# Patient Record
Sex: Male | Born: 1960
Health system: Southern US, Community
[De-identification: ages and names within clinical notes are randomized; demographics above are authoritative.]

## PROBLEM LIST (undated history)

## (undated) DIAGNOSIS — M51369 Other intervertebral disc degeneration, lumbar region without mention of lumbar back pain or lower extremity pain: Secondary | ICD-10-CM

## (undated) DIAGNOSIS — M542 Cervicalgia: Secondary | ICD-10-CM

## (undated) DIAGNOSIS — K219 Gastro-esophageal reflux disease without esophagitis: Secondary | ICD-10-CM

## (undated) DIAGNOSIS — M79606 Pain in leg, unspecified: Secondary | ICD-10-CM

## (undated) DIAGNOSIS — M5030 Other cervical disc degeneration, unspecified cervical region: Secondary | ICD-10-CM

## (undated) DIAGNOSIS — Z972 Presence of dental prosthetic device (complete) (partial): Secondary | ICD-10-CM

## (undated) DIAGNOSIS — M5136 Other intervertebral disc degeneration, lumbar region: Secondary | ICD-10-CM

## (undated) HISTORY — PX: CHOLECYSTECTOMY: SHX55

## (undated) HISTORY — PX: MULTIPLE TOOTH EXTRACTIONS: SHX2053

## (undated) HISTORY — PX: COLONOSCOPY: SHX174

---

## 2000-11-02 HISTORY — PX: BLADDER SURGERY: SHX569

## 2008-12-21 ENCOUNTER — Ambulatory Visit: Payer: Self-pay | Admitting: Nurse Practitioner

## 2009-11-27 ENCOUNTER — Encounter: Admission: RE | Admit: 2009-11-27 | Discharge: 2009-11-27 | Payer: Self-pay | Admitting: Unknown Physician Specialty

## 2012-11-16 ENCOUNTER — Ambulatory Visit: Payer: Self-pay | Admitting: Family Medicine

## 2012-12-29 ENCOUNTER — Ambulatory Visit: Payer: Self-pay | Admitting: Surgery

## 2012-12-29 DIAGNOSIS — E119 Type 2 diabetes mellitus without complications: Secondary | ICD-10-CM

## 2012-12-29 LAB — HEPATIC FUNCTION PANEL A (ARMC)
Albumin: 3.8 g/dL (ref 3.4–5.0)
Bilirubin, Direct: 0.1 mg/dL (ref 0.00–0.20)
Bilirubin,Total: 0.5 mg/dL (ref 0.2–1.0)
SGOT(AST): 20 U/L (ref 15–37)
SGPT (ALT): 29 U/L (ref 12–78)
Total Protein: 7.3 g/dL (ref 6.4–8.2)

## 2012-12-29 LAB — BASIC METABOLIC PANEL
Anion Gap: 7 (ref 7–16)
BUN: 15 mg/dL (ref 7–18)
Calcium, Total: 8.5 mg/dL (ref 8.5–10.1)
Chloride: 107 mmol/L (ref 98–107)
Co2: 25 mmol/L (ref 21–32)
Creatinine: 1.05 mg/dL (ref 0.60–1.30)
EGFR (Non-African Amer.): >60
Glucose: 106 mg/dL — ABNORMAL HIGH (ref 65–99)
Osmolality: 279 (ref 275–301)

## 2012-12-29 LAB — CBC WITH DIFFERENTIAL/PLATELET
Eosinophil #: 0.2 10*3/uL (ref 0.0–0.7)
Eosinophil %: 2.6 %
HCT: 44 % (ref 40.0–52.0)
HGB: 15.2 g/dL (ref 13.0–18.0)
Lymphocyte #: 2.7 10*3/uL (ref 1.0–3.6)
Lymphocyte %: 30.5 %
MCH: 29.8 pg (ref 26.0–34.0)
Monocyte %: 7.3 %
Platelet: 264 10*3/uL (ref 150–440)
RBC: 5.1 10*6/uL (ref 4.40–5.90)
WBC: 8.7 10*3/uL (ref 3.8–10.6)

## 2013-01-03 ENCOUNTER — Ambulatory Visit: Payer: Self-pay | Admitting: Surgery

## 2013-07-29 ENCOUNTER — Encounter (HOSPITAL_COMMUNITY): Payer: Self-pay | Admitting: Emergency Medicine

## 2013-07-29 ENCOUNTER — Emergency Department (HOSPITAL_COMMUNITY)
Admission: EM | Admit: 2013-07-29 | Discharge: 2013-07-29 | Disposition: A | Discharge status: Home / Self Care | Payer: Managed Care, Other (non HMO) | Attending: Emergency Medicine | Admitting: Emergency Medicine

## 2013-07-29 DIAGNOSIS — M5137 Other intervertebral disc degeneration, lumbosacral region: Secondary | ICD-10-CM | POA: Insufficient documentation

## 2013-07-29 DIAGNOSIS — M549 Dorsalgia, unspecified: Secondary | ICD-10-CM

## 2013-07-29 DIAGNOSIS — M5136 Other intervertebral disc degeneration, lumbar region: Secondary | ICD-10-CM

## 2013-07-29 DIAGNOSIS — M545 Low back pain, unspecified: Secondary | ICD-10-CM | POA: Insufficient documentation

## 2013-07-29 DIAGNOSIS — M51379 Other intervertebral disc degeneration, lumbosacral region without mention of lumbar back pain or lower extremity pain: Secondary | ICD-10-CM | POA: Insufficient documentation

## 2013-07-29 DIAGNOSIS — F17200 Nicotine dependence, unspecified, uncomplicated: Secondary | ICD-10-CM | POA: Insufficient documentation

## 2013-07-29 HISTORY — DX: Other intervertebral disc degeneration, lumbar region without mention of lumbar back pain or lower extremity pain: M51.369

## 2013-07-29 HISTORY — DX: Other cervical disc degeneration, unspecified cervical region: M50.30

## 2013-07-29 HISTORY — DX: Other intervertebral disc degeneration, lumbar region: M51.36

## 2013-07-29 MED ORDER — DIAZEPAM 5 MG/ML IJ SOLN
10.0000 mg | Freq: Once | INTRAMUSCULAR | Status: AC
  Administered 2013-07-29: 10 mg via INTRAMUSCULAR
  Filled 2013-07-29: qty 2

## 2013-07-29 MED ORDER — OXYCODONE-ACETAMINOPHEN 5-325 MG PO TABS: 1.0000 | ORAL_TABLET | ORAL | Status: DC | PRN

## 2013-07-29 MED ORDER — ONDANSETRON 4 MG PO TBDP
8.0000 mg | ORAL_TABLET | Freq: Once | ORAL | Status: AC
  Administered 2013-07-29: 8 mg via ORAL
  Filled 2013-07-29: qty 2

## 2013-07-29 MED ORDER — DIAZEPAM 10 MG PO TABS: 10.0000 mg | ORAL_TABLET | Freq: Four times a day (QID) | ORAL | Status: DC | PRN

## 2013-07-29 MED ORDER — HYDROMORPHONE HCL PF 2 MG/ML IJ SOLN
2.0000 mg | Freq: Once | INTRAMUSCULAR | Status: AC
  Administered 2013-07-29: 2 mg via INTRAMUSCULAR
  Filled 2013-07-29: qty 1

## 2013-07-29 NOTE — ED Notes (Signed)
Patient is up with assistance x1 taking five step with assistance x1.

## 2013-07-29 NOTE — ED Provider Notes (Signed)
CSN: [id number]     Arrival date & time 07/29/13  4098 History   First MD Initiated Contact with Patient 07/29/13 0831     Chief Complaint  Patient presents with  . Back Pain   (Consider location/radiation/quality/duration/timing/severity/associated sxs/prior Treatment) HPI Comments: Seth [name] is a 52 y.o. Male who presents for evaluation of lower back pain. Pain started yesterday after he bent over to wash his hands at a sink. Since then, he has had severe low back pain, radiating to the right is worse with movement. He did not take anything for it. He has known degenerative disc disease of the cervical and lumbar spine. He had injection for cervical degenerative disc disease, recently. He is contemplating neck surgery. He denies leg weakness, bowel, or urinary incontinence, now. He denies fever, chills, cough, chest pain, shortness of breath. He denies or dizziness. There are no other known modifying factors.  Patient is a 52 y.o. male presenting with back pain. The history is provided by the patient.  Back Pain   Past Medical History  Diagnosis Date  . Degenerative disc disease, cervical   . Degenerative disc disease, lumbar    Past Surgical History  Procedure Laterality Date  . Cholecystectomy    . Bladder surgery  2002    removed scar tissue   No family history on file. History  Substance Use Topics  . Smoking status: Current Every Day Smoker -- 1.00 packs/day    Types: Cigarettes  . Smokeless tobacco: Not on file  . Alcohol Use: No     Comment: occasionally    Review of Systems  Musculoskeletal: Positive for back pain.  All other systems reviewed and are negative.    Allergies  Review of patient's allergies indicates no known allergies.  Home Medications   Current Outpatient Rx  Name  Route  Sig  Dispense  Refill  . traMADol (ULTRAM) 50 MG tablet   Oral   Take 50 mg by mouth every 6 (six) hours as needed for pain.         Marland Kitchen venlafaxine (EFFEXOR) 75 MG  tablet   Oral   Take 75 mg by mouth daily.          . diazepam (VALIUM) 10 MG tablet   Oral   Take 1 tablet (10 mg total) by mouth every 6 (six) hours as needed (Muscle spasm).   20 tablet   0   . oxyCODONE-acetaminophen (PERCOCET/ROXICET) 5-325 MG per tablet   Oral   Take 1 tablet by mouth every 4 (four) hours as needed for pain.   30 tablet   0    BP 130/78  Pulse 72  Temp(Src) 97.9 F (36.6 C) (Oral)  Resp 18  Ht 6\' 3"  (1.905 m)  Wt 225 lb (102.059 kg)  BMI 28.12 kg/m2  SpO2 100% Physical Exam  Nursing note and vitals reviewed. Constitutional: He is oriented to person, place, and time. He appears well-developed and well-nourished.  HENT:  Head: Normocephalic and atraumatic.  Right Ear: External ear normal.  Left Ear: External ear normal.  Eyes: Conjunctivae and EOM are normal. Pupils are equal, round, and reactive to light.  Neck: Normal range of motion and phonation normal. Neck supple.  Cardiovascular: Normal rate and regular rhythm.   Pulmonary/Chest: Effort normal. He exhibits no bony tenderness.  Abdominal: Normal appearance.  Musculoskeletal: He exhibits tenderness.  Tender right lumbar with decreased motion secondary to pain.  Neurological: He is alert and oriented to person, place, and  time. He has normal strength. No cranial nerve deficit or sensory deficit. He exhibits normal muscle tone. Coordination normal.  Skin: Skin is warm, dry and intact.  Psychiatric: He has a normal mood and affect. His behavior is normal. Judgment and thought content normal.    ED Course  Procedures (including critical care time)  Medications  HYDROmorphone (DILAUDID) injection 2 mg (2 mg Intramuscular Given 07/29/13 0950)  diazepam (VALIUM) injection 10 mg (10 mg Intramuscular Given 07/29/13 0951)  ondansetron (ZOFRAN-ODT) disintegrating tablet 8 mg (8 mg Oral Given 07/29/13 0950)    Patient Vitals for the past 24 hrs:  BP Temp Temp src Pulse Resp SpO2 Height Weight   07/29/13 1100 130/78 mmHg - - 72 - 100 % - -  07/29/13 1030 127/88 mmHg - - 70 18 100 % - -  07/29/13 1000 129/84 mmHg - - 75 - 98 % - -  07/29/13 0930 125/82 mmHg - - 72 - 95 % - -  07/29/13 0900 128/97 mmHg - - 81 - 100 % - -  07/29/13 0835 130/93 mmHg 97.9 F (36.6 C) Oral 85 20 99 % 6\' 3"  (1.905 m) 225 lb (102.059 kg)     10:41 AM Reevaluation with update and discussion. After initial assessment and treatment, an updated evaluation reveals he is improved, ambulation trial, ordered. Seth [name] L   11:15 a.m.- able to ambulate within the room. This is an improvement    MDM   1. Back pain   2. Degenerative disc disease, lumbar    Acute back pain secondary to muscle strain. Doubt cauda equina syndrome, significant spinal stenosis, or nerve impingement. He is stable for discharge   Nursing Notes Reviewed/ Care Coordinated, and agree without changes. Applicable Imaging Reviewed.  Interpretation of Laboratory Data incorporated into ED treatment   Plan: Home Medications- Percocet, Valium; Home Treatments and Observation- Heat. rest; return here if the recommended treatment, does not improve the symptoms; Recommended follow up- Ortho prn     Flint Melter, MD 07/29/13 1129

## 2013-07-29 NOTE — ED Notes (Signed)
Pt c/o lower back pain that started yesterday, sts when he stood up he here a little pop then after that the pain started, was able to still work and then need up leaving work early because the pain progressively became worse. sts the pain is so bad it hurts to stand up and walk. Denies urinary/bowel issues. sts sudden movements makes the pain worse. Reports hx of degenerative disc problems, sts on Thursday he had injections in his neck. Pt in nad, skin warm and dry, resp e/u.

## 2013-09-12 ENCOUNTER — Ambulatory Visit: Payer: Self-pay | Admitting: Family Medicine

## 2014-05-22 LAB — HEMOGLOBIN A1C: HEMOGLOBIN A1C: 6.1 % — AB (ref 4.0–6.0)

## 2014-05-22 LAB — PSA: PSA: 0.6

## 2014-07-16 ENCOUNTER — Ambulatory Visit: Payer: Self-pay | Admitting: Gastroenterology

## 2014-07-16 LAB — HM COLONOSCOPY

## 2015-02-22 NOTE — Op Note (Signed)
PATIENT NAME:  Seth [name], CASTILLE MR#:  [id number] DATE OF BIRTH:  December 14, 1960  DATE OF PROCEDURE:  01/03/2013  PREOPERATIVE DIAGNOSIS: Cholecystitis and cholelithiasis.   POSTOPERATIVE DIAGNOSIS: Cholecystitis and cholelithiasis.   OPERATION: Robotically assisted cholecystectomy.   SURGEON: Micheline Maze, MD   ASSISTANT: Glena Norfolk. Lundquist, MD   ANESTHESIA: General.   OPERATIVE PROCEDURE: With the patient in the supine position, after the induction of appropriate general anesthesia, the patient's abdomen was prepped with ChloraPrep and draped with sterile towels. The patient was placed in the head down, feet up position. A small infraumbilical incision was made and carried down bluntly through the subcutaneous tissue. The Veress needle was used to cannulate the peritoneal cavity. CO2 was insufflated to appropriate pressure measurements. When approximately 2.5 liters of CO2 was instilled, the Veress needle was withdrawn. An 11 mm Ethicon XCEL port was placed into the peritoneal cavity. Intraperitoneal position was confirmed, and CO2 was reinsufflated. At previously designated sites, three 8.5 mm ports were inserted under direct vision. The patient was then appropriately positioned, gallbladder visualized, and the da Vinci robot then advanced to the table and locked and docked to the trocars. Instruments were placed under direct vision in the area of the gallbladder, and then the operative surgeon moved to the [employer] console. The gallbladder was elevated with the third arm and dissection carried out with the first and second arms. The cystic duct and cystic artery were identified. The cystic duct was quite big, but followed back to the gallbladder and down to where it appeared to join the common duct. It appeared to be approximately 8 mm in size. A small rent was made in the fundus of the gallbladder during the dissection likely from over-tension on the gallbladder wall. The duct was doubly  clipped and divided. The cystic artery was doubly clipped and divided. The gallbladder was then removed from its bed and delivered using hook and cautery apparatus, again under direct vision. Reviewing the bed of the liver, there did not appear to be any significant evidence of injury. The area was carefully irrigated. The gallbladder was captured in an Endo Catch apparatus, the robot undocked, and the gallbladder brought out through the umbilical port. The area was copiously suctioned and irrigated. All bile was removed, and there was no evidence of any spilled stones. The abdomen was desufflated. Midline fascia was closed with figure-of-eight sutures of 0 Vicryl, and the skin was closed with 5-0 nylon. The area was infiltrated with 0.25% Marcaine for postoperative pain control. Sterile dressings were applied. The patient was returned to the recovery room having tolerated the procedure well. Sponge, instrument and needle counts were correct x2 in the operating room.     ____________________________ Rodena Goldmann III, MD rle:OSi D: 01/04/2013 08:38:42 ET T: 01/04/2013 08:46:30 ET JOB#: [id number]  cc: Micheline Maze, MD, <Dictator> Arlis Porta., MD Rodena Goldmann MD ELECTRONICALLY SIGNED 01/06/2013 23:42

## 2015-03-26 ENCOUNTER — Other Ambulatory Visit: Payer: Self-pay | Admitting: Family Medicine

## 2015-03-28 ENCOUNTER — Other Ambulatory Visit: Payer: Self-pay | Admitting: Family Medicine

## 2015-03-28 DIAGNOSIS — R918 Other nonspecific abnormal finding of lung field: Secondary | ICD-10-CM

## 2015-04-04 ENCOUNTER — Ambulatory Visit: Admission: RE | Admit: 2015-04-04 | Payer: 59 | Source: Ambulatory Visit

## 2015-04-10 ENCOUNTER — Ambulatory Visit
Admission: RE | Admit: 2015-04-10 | Discharge: 2015-04-10 | Disposition: A | Discharge status: Home / Self Care | Payer: 59 | Source: Ambulatory Visit | Attending: Family Medicine | Admitting: Family Medicine

## 2015-04-10 DIAGNOSIS — R079 Chest pain, unspecified: Secondary | ICD-10-CM | POA: Insufficient documentation

## 2015-04-10 DIAGNOSIS — R918 Other nonspecific abnormal finding of lung field: Secondary | ICD-10-CM | POA: Diagnosis not present

## 2015-04-10 DIAGNOSIS — Z87891 Personal history of nicotine dependence: Secondary | ICD-10-CM | POA: Diagnosis not present

## 2015-04-25 ENCOUNTER — Telehealth: Payer: Self-pay | Admitting: Family Medicine

## 2015-04-25 NOTE — Telephone Encounter (Signed)
Please review in Imaging and let me know what to say.[facility]

## 2015-04-25 NOTE — Telephone Encounter (Signed)
Pt  Called states that he had a CT about  2 weeks ago no one have called about  Results pt. Call back # is  [phone]. Thanks

## 2015-04-26 NOTE — Telephone Encounter (Signed)
Chest CT showed signs of emphysema.  Also many vascular irregularities that could be many things. A chest CTA has been recommended and I agree.  Also many small nodules that are probably vessels, but difficult to be sure. They recommend regular f/u (6-12 mos) for at least 2 years to insure stability.

## 2015-05-01 NOTE — Telephone Encounter (Signed)
A CTA is a CT scan with contrast.  He would need a BMP before getting it done.  Be sure patient agrees before scheduling it.  It would show whether these small bumps are vascular (blood vessels or small aneurysms) or not.  We could just get another regular chest CT in 6 months to follow.  There is no right or wrong way.  He can discuss this with me at his next visit if he would like.-jh

## 2015-05-01 NOTE — Telephone Encounter (Signed)
What is a Chest CTA and how do I set this up?

## 2015-05-02 NOTE — Telephone Encounter (Signed)
I have left a few messages and can not get with this patient. Is this something we can discuss at visit next time or should I send a letter?

## 2015-05-03 NOTE — Telephone Encounter (Signed)
Let's send him a letter stating that we would like to discuss the chest CT scan further and ask him to come in to see me to discuss face to face.  Be sure to ell him I am not expecting anything really bad, but personal discussion is probably best to answer questions about the requested test.=-jh

## 2015-05-07 NOTE — Telephone Encounter (Signed)
Spoke to patient and he made appt for next week early AM. We put him in an 8am slot but told him to be here at 8:10. [facility]

## 2015-05-13 NOTE — Telephone Encounter (Signed)
Noted-jh 

## 2015-05-15 ENCOUNTER — Telehealth: Payer: Self-pay | Admitting: Family Medicine

## 2015-05-15 ENCOUNTER — Ambulatory Visit (INDEPENDENT_AMBULATORY_CARE_PROVIDER_SITE_OTHER): Payer: Self-pay | Admitting: Family Medicine

## 2015-05-15 ENCOUNTER — Encounter: Payer: Self-pay | Admitting: Family Medicine

## 2015-05-15 VITALS — BP 141/91 | HR 88 | Temp 98.0°F | Resp 16 | Wt 228.0 lb

## 2015-05-15 DIAGNOSIS — R9389 Abnormal findings on diagnostic imaging of other specified body structures: Secondary | ICD-10-CM

## 2015-05-15 DIAGNOSIS — R938 Abnormal findings on diagnostic imaging of other specified body structures: Secondary | ICD-10-CM

## 2015-05-15 NOTE — Progress Notes (Signed)
Name: Seth [name]   MRN: [id number]    DOB: 1960-11-24   Date:05/15/2015       Progress Note  Subjective  Chief Complaint  Chief Complaint  Patient presents with  . Follow-up    Ct Results    HPI  Here for f/u of CT scan.  Needs CTA   No problem-specific assessment & plan notes found for this encounter.   Past Medical History  Diagnosis Date  . Degenerative disc disease, cervical   . Degenerative disc disease, lumbar     History  Substance Use Topics  . Smoking status: Current Every Day Smoker -- 1.00 packs/day    Types: Cigarettes  . Smokeless tobacco: Not on file  . Alcohol Use: No     Comment: occasionally     Current outpatient prescriptions:  .  traMADol (ULTRAM) 50 MG tablet, Take 50 mg by mouth every 6 (six) hours as needed for pain., Disp: , Rfl:  .  venlafaxine (EFFEXOR) 75 MG tablet, Take 75 mg by mouth daily. , Disp: , Rfl:  .  diazepam (VALIUM) 10 MG tablet, Take 1 tablet (10 mg total) by mouth every 6 (six) hours as needed (Muscle spasm). (Patient not taking: Reported on 05/15/2015), Disp: 20 tablet, Rfl: 0 .  oxyCODONE-acetaminophen (PERCOCET/ROXICET) 5-325 MG per tablet, Take 1 tablet by mouth every 4 (four) hours as needed for pain. (Patient not taking: Reported on 05/15/2015), Disp: 30 tablet, Rfl: 0  No Known Allergies  Review of Systems  Constitutional: Negative for fever, chills, weight loss and malaise/fatigue.  Eyes: Negative for blurred vision and double vision.  Respiratory: Negative for cough, sputum production, shortness of breath and wheezing.   Cardiovascular: Negative for chest pain, palpitations, orthopnea and claudication.  Gastrointestinal: Negative for heartburn, abdominal pain and diarrhea.  Skin: Negative for rash.  Neurological: Negative for dizziness, sensory change, focal weakness and headaches.       Objective  Filed Vitals:   05/15/15 0809  BP: 141/91  Pulse: 88  Temp: 98 F (36.7 C)  TempSrc: Oral  Resp: 16   Weight: 228 lb (103.42 kg)     Physical Exam  Constitutional: He is well-developed, well-nourished, and in no distress.  Cardiovascular: Normal rate, regular rhythm, normal heart sounds and intact distal pulses.  Exam reveals no gallop and no friction rub.   No murmur heard. Pulmonary/Chest: Effort normal and breath sounds normal. No respiratory distress. He has no wheezes. He has no rales.  Vitals reviewed.      No results found for this or any previous visit (from the past 2160 hour(s)).   Assessment & Plan  1. Abnormal CT scan, chest  - CT Angio Chest W/Cm &/Or Wo Cm; Future - Basic Metabolic Panel (BMET)

## 2015-05-15 NOTE — Telephone Encounter (Signed)
Pt notified reg appt date and time

## 2015-05-15 NOTE — Telephone Encounter (Signed)
Pt has appointment on 05/21/2015 at 9:30 am at [facility] location pt notified

## 2015-05-17 LAB — BASIC METABOLIC PANEL
BUN / CREAT RATIO: 12 (ref 9–20)
BUN: 14 mg/dL (ref 6–24)
CALCIUM: 9.5 mg/dL (ref 8.7–10.2)
CO2: 23 mmol/L (ref 18–29)
CREATININE: 1.13 mg/dL (ref 0.76–1.27)
Chloride: 103 mmol/L (ref 97–108)
GFR calc Af Amer: 87 mL/min/{1.73_m2} (ref >59)
GFR, EST NON AFRICAN AMERICAN: 75 mL/min/{1.73_m2} (ref >59)
Glucose: 121 mg/dL — ABNORMAL HIGH (ref 65–99)
Potassium: 4.6 mmol/L (ref 3.5–5.2)
Sodium: 140 mmol/L (ref 134–144)

## 2015-05-20 ENCOUNTER — Other Ambulatory Visit: Payer: Self-pay | Admitting: Family Medicine

## 2015-05-21 ENCOUNTER — Ambulatory Visit
Admission: RE | Admit: 2015-05-21 | Discharge: 2015-05-21 | Disposition: A | Discharge status: Home / Self Care | Payer: 59 | Source: Ambulatory Visit | Attending: Family Medicine | Admitting: Family Medicine

## 2015-05-21 DIAGNOSIS — R9389 Abnormal findings on diagnostic imaging of other specified body structures: Secondary | ICD-10-CM

## 2015-05-21 DIAGNOSIS — R938 Abnormal findings on diagnostic imaging of other specified body structures: Secondary | ICD-10-CM | POA: Diagnosis present

## 2015-05-21 DIAGNOSIS — R918 Other nonspecific abnormal finding of lung field: Secondary | ICD-10-CM | POA: Diagnosis not present

## 2015-05-21 MED ORDER — IOHEXOL 350 MG/ML SOLN
100.0000 mL | Freq: Once | INTRAVENOUS | Status: AC | PRN
  Administered 2015-05-21: 100 mL via INTRAVENOUS

## 2015-05-22 ENCOUNTER — Encounter: Payer: Self-pay | Admitting: Family Medicine

## 2015-05-27 ENCOUNTER — Telehealth: Payer: Self-pay

## 2015-05-27 DIAGNOSIS — J984 Other disorders of lung: Secondary | ICD-10-CM

## 2015-05-27 NOTE — Telephone Encounter (Signed)
This encounter was created in error - please disregard.

## 2015-05-27 NOTE — Telephone Encounter (Signed)
Advised appt Aug 4 8:30 am Seth [name] and went over the results of CT Angio.[facility]

## 2015-05-27 NOTE — Telephone Encounter (Signed)
-----   Message from Arlis Porta., MD sent at 05/21/2015  2:37 PM EDT ----- His CTA shown on signs of aneurysms or other major vascular malformations.  He does have very torturous arteries in his lungs and some thin walled cysts.  This may be something genetic.  We can discuss further at his next visit.  Nothing that we have to do anything about at this time.-jh

## 2015-06-11 ENCOUNTER — Encounter: Payer: Self-pay | Admitting: Family Medicine

## 2015-06-11 DIAGNOSIS — M549 Dorsalgia, unspecified: Secondary | ICD-10-CM

## 2015-06-11 DIAGNOSIS — R918 Other nonspecific abnormal finding of lung field: Secondary | ICD-10-CM

## 2015-06-11 DIAGNOSIS — M1990 Unspecified osteoarthritis, unspecified site: Secondary | ICD-10-CM | POA: Insufficient documentation

## 2015-06-11 DIAGNOSIS — R7303 Prediabetes: Secondary | ICD-10-CM

## 2015-06-11 DIAGNOSIS — R1031 Right lower quadrant pain: Secondary | ICD-10-CM | POA: Insufficient documentation

## 2015-06-11 DIAGNOSIS — M170 Bilateral primary osteoarthritis of knee: Secondary | ICD-10-CM

## 2015-06-11 DIAGNOSIS — M25569 Pain in unspecified knee: Secondary | ICD-10-CM | POA: Insufficient documentation

## 2015-06-11 DIAGNOSIS — M5030 Other cervical disc degeneration, unspecified cervical region: Secondary | ICD-10-CM

## 2015-06-11 DIAGNOSIS — Z720 Tobacco use: Secondary | ICD-10-CM | POA: Insufficient documentation

## 2015-06-11 DIAGNOSIS — G8929 Other chronic pain: Secondary | ICD-10-CM

## 2015-06-11 NOTE — Progress Notes (Signed)
This encounter was created in error - please disregard.

## 2015-10-09 ENCOUNTER — Ambulatory Visit (INDEPENDENT_AMBULATORY_CARE_PROVIDER_SITE_OTHER): Payer: 59 | Admitting: Family Medicine

## 2015-10-09 ENCOUNTER — Encounter: Payer: Self-pay | Admitting: Family Medicine

## 2015-10-09 VITALS — BP 126/84 | HR 86 | Temp 97.7°F | Resp 16 | Ht 75.0 in | Wt 230.0 lb

## 2015-10-09 DIAGNOSIS — G8929 Other chronic pain: Secondary | ICD-10-CM | POA: Diagnosis not present

## 2015-10-09 DIAGNOSIS — M549 Dorsalgia, unspecified: Secondary | ICD-10-CM | POA: Diagnosis not present

## 2015-10-09 DIAGNOSIS — R918 Other nonspecific abnormal finding of lung field: Secondary | ICD-10-CM | POA: Diagnosis not present

## 2015-10-09 DIAGNOSIS — J431 Panlobular emphysema: Secondary | ICD-10-CM | POA: Diagnosis not present

## 2015-10-09 DIAGNOSIS — J432 Centrilobular emphysema: Secondary | ICD-10-CM | POA: Insufficient documentation

## 2015-10-09 DIAGNOSIS — R768 Other specified abnormal immunological findings in serum: Secondary | ICD-10-CM | POA: Diagnosis not present

## 2015-10-09 DIAGNOSIS — M1990 Unspecified osteoarthritis, unspecified site: Secondary | ICD-10-CM | POA: Diagnosis not present

## 2015-10-09 DIAGNOSIS — Z23 Encounter for immunization: Secondary | ICD-10-CM | POA: Diagnosis not present

## 2015-10-09 NOTE — Progress Notes (Signed)
Name: Seth [name]   MRN: [id number]    DOB: 06-20-1961   Date:10/09/2015       Progress Note  Subjective  Chief Complaint  Chief Complaint  Patient presents with  . Results    here to discuss CT and labs    HPI Here to inquire about lungs and labs.  Has COPD on Pulminary evaluation by Dr. Raul Del.  Had card stress test that patient reported as OK except SOB.   He has had elevated ANA and RF.  Needs referral to Rheumatology.  Labs re: rtheuma ordered by Pulmonary.  No problem-specific assessment & plan notes found for this encounter.   Past Medical History  Diagnosis Date  . Degenerative disc disease, cervical   . Degenerative disc disease, lumbar     Past Surgical History  Procedure Laterality Date  . Cholecystectomy    . Bladder surgery  2002    removed scar tissue    Family History  Problem Relation Age of Onset  . Diabetes Mother   . Cancer Father   . Heart disease Father     Social History   Social History  . Marital Status: Married    Spouse Name: N/A  . Number of Children: N/A  . Years of Education: N/A   Occupational History  . Not on file.   Social History Main Topics  . Smoking status: Current Every Day Smoker -- 1.00 packs/day    Types: Cigarettes  . Smokeless tobacco: Not on file  . Alcohol Use: No     Comment: occasionally  . Drug Use: No  . Sexual Activity: Yes   Other Topics Concern  . Not on file   Social History Narrative     Current outpatient prescriptions:  .  COMBIVENT RESPIMAT 20-100 MCG/ACT AERS respimat, Inhale 1 puff into the lungs 4 (four) times daily as needed., Disp: , Rfl: 12 .  traMADol (ULTRAM) 50 MG tablet, Take 50 mg by mouth 3 (three) times daily., Disp: , Rfl: 2 .  venlafaxine (EFFEXOR) 75 MG tablet, TAKE 1 TABLET BY MOUTH AT BEDTIME., Disp: 30 tablet, Rfl: 6  No Known Allergies   Review of Systems  Constitutional: Positive for malaise/fatigue. Negative for fever and weight loss.  Eyes: Negative for  blurred vision and double vision.  Respiratory: Positive for cough (a little), shortness of breath and wheezing (occ.).   Cardiovascular: Negative for chest pain, palpitations and leg swelling.  Neurological: Positive for weakness. Negative for dizziness, tremors and headaches.      Objective  Filed Vitals:   10/09/15 0848  BP: 126/84  Pulse: 86  Temp: 97.7 F (36.5 C)  TempSrc: Oral  Resp: 16  Height: 6\' 3"  (1.905 m)  Weight: 230 lb (104.327 kg)    Physical Exam  Constitutional: He is well-developed, well-nourished, and in no distress. No distress.  HENT:  Head: Normocephalic and atraumatic.  Eyes: Conjunctivae and EOM are normal. Pupils are equal, round, and reactive to light.  Neck: Normal range of motion. Neck supple. Carotid bruit is not present. No thyromegaly present.  Cardiovascular: Normal rate, regular rhythm, normal heart sounds and intact distal pulses.  Exam reveals no gallop.   No murmur heard. Pulmonary/Chest: Effort normal and breath sounds normal. No respiratory distress. He has no wheezes. He has no rales.  Abdominal: Soft. Bowel sounds are normal. He exhibits no distension, no abdominal bruit and no mass. There is no tenderness.  Musculoskeletal: He exhibits no edema.  No acute joints  noted today.  Some reported knee ands neck pain with certain ROM.  Lymphadenopathy:    He has no cervical adenopathy.  Neurological: He is alert.  Vitals reviewed.      No results found for this or any previous visit (from the past 2160 hour(s)).   Assessment & Plan  Problem List Items Addressed This Visit      Respiratory   COPD (chronic obstructive pulmonary disease) ([place])     Musculoskeletal and Integument   Arthritis - Primary   Relevant Medications   traMADol (ULTRAM) 50 MG tablet   Other Relevant Orders   Ambulatory referral to Rheumatology     Other   Chronic back pain   Relevant Medications   traMADol (ULTRAM) 50 MG tablet   Lung nodules   Need  for influenza vaccination   Relevant Orders   Flu Vaccine QUAD 36+ mos PF IM (Fluarix & Fluzone Quad PF) (Completed)    Other Visit Diagnoses    Elevated rheumatoid factor        Relevant Orders    Ambulatory referral to Rheumatology       Meds ordered this encounter  Medications  . traMADol (ULTRAM) 50 MG tablet    Sig: Take 50 mg by mouth 3 (three) times daily.    Refill:  2   1. Arthritis  - Ambulatory referral to Rheumatology  2. Need for influenza vaccination  - Flu Vaccine QUAD 36+ mos PF IM (Fluarix & Fluzone Quad PF)  3. Panlobular emphysema (H[facility]) -cont. To see Dr. Raul Del  4. Chronic back pain   5. Lung nodules   6. Elevated rheumatoid factor  - Ambulatory referral to Rheumatology

## 2015-10-14 ENCOUNTER — Other Ambulatory Visit: Payer: Self-pay | Admitting: Family Medicine

## 2015-10-14 ENCOUNTER — Telehealth: Payer: Self-pay | Admitting: Family Medicine

## 2015-10-14 DIAGNOSIS — M1990 Unspecified osteoarthritis, unspecified site: Secondary | ICD-10-CM

## 2015-10-14 NOTE — Telephone Encounter (Signed)
error 

## 2015-10-29 DIAGNOSIS — M2550 Pain in unspecified joint: Secondary | ICD-10-CM | POA: Insufficient documentation

## 2015-10-29 DIAGNOSIS — M25562 Pain in left knee: Secondary | ICD-10-CM

## 2015-10-29 DIAGNOSIS — M25561 Pain in right knee: Secondary | ICD-10-CM

## 2015-10-29 DIAGNOSIS — G8929 Other chronic pain: Secondary | ICD-10-CM | POA: Insufficient documentation

## 2015-10-29 DIAGNOSIS — R768 Other specified abnormal immunological findings in serum: Secondary | ICD-10-CM | POA: Insufficient documentation

## 2016-01-27 ENCOUNTER — Telehealth: Payer: Self-pay | Admitting: Family Medicine

## 2016-01-27 ENCOUNTER — Other Ambulatory Visit: Payer: Self-pay | Admitting: Family Medicine

## 2016-01-27 MED ORDER — VENLAFAXINE HCL 75 MG PO TABS: 75.0000 mg | ORAL_TABLET | Freq: Every day | ORAL | Status: DC

## 2016-01-27 NOTE — Telephone Encounter (Signed)
I have sent to pharmacy.-jh

## 2016-01-27 NOTE — Telephone Encounter (Signed)
Message forwarded to Dr. Luan Pulling it's anxiety and depression meds and may be pt needs to be seen ?

## 2016-01-27 NOTE — Telephone Encounter (Signed)
Pt needs a refill on venlafaxine sent to [facility] in [place].  His call back number is [phone]

## 2016-04-21 ENCOUNTER — Ambulatory Visit (INDEPENDENT_AMBULATORY_CARE_PROVIDER_SITE_OTHER): Payer: No Typology Code available for payment source | Admitting: Family Medicine

## 2016-04-21 ENCOUNTER — Encounter: Payer: Self-pay | Admitting: Family Medicine

## 2016-04-21 VITALS — BP 134/83 | HR 87 | Temp 97.8°F | Resp 16 | Ht 75.0 in | Wt 226.0 lb

## 2016-04-21 DIAGNOSIS — M150 Primary generalized (osteo)arthritis: Secondary | ICD-10-CM

## 2016-04-21 DIAGNOSIS — M159 Polyosteoarthritis, unspecified: Secondary | ICD-10-CM

## 2016-04-21 DIAGNOSIS — M549 Dorsalgia, unspecified: Secondary | ICD-10-CM

## 2016-04-21 DIAGNOSIS — J431 Panlobular emphysema: Secondary | ICD-10-CM

## 2016-04-21 DIAGNOSIS — G8929 Other chronic pain: Secondary | ICD-10-CM

## 2016-04-21 DIAGNOSIS — R7303 Prediabetes: Secondary | ICD-10-CM

## 2016-04-21 MED ORDER — VENLAFAXINE HCL ER 150 MG PO CP24: 150.0000 mg | ORAL_CAPSULE | Freq: Every day | ORAL | Status: DC

## 2016-04-21 NOTE — Progress Notes (Signed)
Name: Seth [name]   MRN: [id number]    DOB: 04/10/1961   Date:04/21/2016       Progress Note  Subjective  Chief Complaint  Chief Complaint  Patient presents with  . Arthritis    HPI Here for f/u of chronic back pain and COPD.  He still smokes 1 ppd.  Has not needed  combivent recently.  Sees pain management in [place].  Effexor seems to help some. Would like it to be better.   He has an appt for a CP next month.  HE will get labs with that. No problem-specific assessment & plan notes found for this encounter.   Past Medical History  Diagnosis Date  . Degenerative disc disease, cervical   . Degenerative disc disease, lumbar     Past Surgical History  Procedure Laterality Date  . Cholecystectomy    . Bladder surgery  2002    removed scar tissue    Family History  Problem Relation Age of Onset  . Diabetes Mother   . Cancer Father   . Heart disease Father     Social History   Social History  . Marital Status: Married    Spouse Name: N/A  . Number of Children: N/A  . Years of Education: N/A   Occupational History  . Not on file.   Social History Main Topics  . Smoking status: Current Every Day Smoker -- 1.00 packs/day    Types: Cigarettes  . Smokeless tobacco: Not on file  . Alcohol Use: No     Comment: occasionally  . Drug Use: No  . Sexual Activity: Yes   Other Topics Concern  . Not on file   Social History Narrative     Current outpatient prescriptions:  .  COMBIVENT RESPIMAT 20-100 MCG/ACT AERS respimat, Inhale 1 puff into the lungs 4 (four) times daily as needed., Disp: , Rfl: 12 .  traMADol (ULTRAM) 50 MG tablet, Take 50 mg by mouth 3 (three) times daily., Disp: , Rfl: 2 .  venlafaxine XR (EFFEXOR XR) 150 MG 24 hr capsule, Take 1 capsule (150 mg total) by mouth daily with breakfast., Disp: 30 capsule, Rfl: 6  Not on File   Review of Systems  Constitutional: Negative for fever, chills, weight loss and malaise/fatigue.  HENT: Negative for  hearing loss.   Eyes: Negative for blurred vision and double vision.  Respiratory: Negative for cough, shortness of breath and wheezing (occ.).   Cardiovascular: Negative for chest pain, palpitations and leg swelling.  Gastrointestinal: Negative for heartburn, abdominal pain and blood in stool.  Genitourinary: Negative for dysuria, urgency and frequency.  Musculoskeletal: Positive for back pain and joint pain.  Skin: Negative for rash.  Neurological: Negative for dizziness, tremors, weakness and headaches.  Psychiatric/Behavioral: Negative for depression. The patient has insomnia. The patient is not nervous/anxious.       Objective  Filed Vitals:   04/21/16 0802  BP: 134/83  Pulse: 87  Temp: 97.8 F (36.6 C)  TempSrc: Oral  Resp: 16  Height: 6\' 3"  (1.905 m)  Weight: 226 lb (102.513 kg)    Physical Exam  Constitutional: He is oriented to person, place, and time and well-developed, well-nourished, and in no distress. No distress.  HENT:  Head: Normocephalic and atraumatic.  Neck: Normal range of motion. Neck supple. Carotid bruit is not present. No thyromegaly present.  Cardiovascular: Normal rate, regular rhythm and normal heart sounds.  Exam reveals no gallop and no friction rub.   No murmur  heard. Pulmonary/Chest: Effort normal and breath sounds normal. No respiratory distress. He has no wheezes. He has no rales.  Abdominal: Soft. Bowel sounds are normal.  Musculoskeletal: He exhibits no edema.  Lymphadenopathy:    He has no cervical adenopathy.  Neurological: He is alert and oriented to person, place, and time.  Psychiatric:  Still with some chronic back pain and difficulty sleeping  Vitals reviewed.      No results found for this or any previous visit (from the past 2160 hour(s)).   Assessment & Plan  Problem List Items Addressed This Visit      Respiratory   COPD (chronic obstructive pulmonary disease) ([place])   Relevant Medications   venlafaxine XR  (EFFEXOR XR) 150 MG 24 hr capsule     Musculoskeletal and Integument   Osteoarthritis   Relevant Orders   CBC with Differential     Other   Pre-diabetes   Relevant Orders   COMPLETE METABOLIC PANEL WITH GFR   Lipid Profile   HgB A1c   Chronic back pain - Primary   Relevant Medications   venlafaxine XR (EFFEXOR XR) 150 MG 24 hr capsule   Other Relevant Orders   PSA      Meds ordered this encounter  Medications  . venlafaxine XR (EFFEXOR XR) 150 MG 24 hr capsule    Sig: Take 1 capsule (150 mg total) by mouth daily with breakfast.    Dispense:  30 capsule    Refill:  6   1. Chronic back pain  - venlafaxine XR (EFFEXOR XR) 150 MG 24 hr capsule; Take 1 capsule (150 mg total) by mouth daily with breakfast.  Dispense: 30 capsule; Refill: 6 - increased from 75 mg/d. - PSA  2. Panlobular emphysema ([place])   3. Primary osteoarthritis involving multiple joints  - CBC with Differential  4. Pre-diabetes  - COMPLETE METABOLIC PANEL WITH GFR - Lipid Profile - HgB A1c

## 2016-04-28 ENCOUNTER — Encounter: Payer: 59 | Admitting: Family Medicine

## 2016-05-20 ENCOUNTER — Other Ambulatory Visit: Payer: Self-pay | Admitting: Family Medicine

## 2016-05-20 ENCOUNTER — Other Ambulatory Visit: Payer: No Typology Code available for payment source

## 2016-05-20 LAB — COMPLETE METABOLIC PANEL WITH GFR
ALBUMIN: 4.1 g/dL (ref 3.6–5.1)
ALT: 14 U/L (ref 9–46)
AST: 11 U/L (ref 10–35)
Alkaline Phosphatase: 49 U/L (ref 40–115)
BUN: 17 mg/dL (ref 7–25)
CALCIUM: 8.9 mg/dL (ref 8.6–10.3)
CHLORIDE: 103 mmol/L (ref 98–110)
CO2: 24 mmol/L (ref 20–31)
CREATININE: 1.06 mg/dL (ref 0.70–1.33)
GFR, Est African American: >89 mL/min (ref >=60)
GFR, Est Non African American: 80 mL/min (ref >=60)
GLUCOSE: 107 mg/dL — AB (ref 65–99)
POTASSIUM: 4.5 mmol/L (ref 3.5–5.3)
SODIUM: 138 mmol/L (ref 135–146)
Total Bilirubin: 0.6 mg/dL (ref 0.2–1.2)
Total Protein: 6.9 g/dL (ref 6.1–8.1)

## 2016-05-20 LAB — CBC WITH DIFFERENTIAL/PLATELET
BASOS ABS: 80 {cells}/uL (ref 0–200)
Basophils Relative: 1 %
EOS ABS: 160 {cells}/uL (ref 15–500)
Eosinophils Relative: 2 %
HEMATOCRIT: 44 % (ref 38.5–50.0)
HEMOGLOBIN: 15 g/dL (ref 13.2–17.1)
LYMPHS ABS: 2480 {cells}/uL (ref 850–3900)
LYMPHS PCT: 31 %
MCH: 29.3 pg (ref 27.0–33.0)
MCHC: 34.1 g/dL (ref 32.0–36.0)
MCV: 85.9 fL (ref 80.0–100.0)
MONO ABS: 720 {cells}/uL (ref 200–950)
MPV: 9.5 fL (ref 7.5–12.5)
Monocytes Relative: 9 %
NEUTROS PCT: 57 %
Neutro Abs: 4560 cells/uL (ref 1500–7800)
Platelets: 280 10*3/uL (ref 140–400)
RBC: 5.12 MIL/uL (ref 4.20–5.80)
RDW: 13.4 % (ref 11.0–15.0)
WBC: 8 10*3/uL (ref 3.8–10.8)

## 2016-05-20 LAB — HEMOGLOBIN A1C
HEMOGLOBIN A1C: 6.2 % — AB (ref <5.7)
MEAN PLASMA GLUCOSE: 131 mg/dL

## 2016-05-20 LAB — LIPID PANEL
Cholesterol: 162 mg/dL (ref 125–200)
HDL: 27 mg/dL — ABNORMAL LOW (ref >=40)
LDL CALC: 102 mg/dL (ref <130)
TRIGLYCERIDES: 164 mg/dL — AB (ref <150)
Total CHOL/HDL Ratio: 6 Ratio — ABNORMAL HIGH (ref <=5.0)
VLDL: 33 mg/dL — AB (ref <30)

## 2016-05-21 LAB — PSA: PSA: 0.74 ng/mL (ref <=4.00)

## 2016-05-25 ENCOUNTER — Encounter: Payer: Self-pay | Admitting: Family Medicine

## 2016-05-25 ENCOUNTER — Ambulatory Visit (INDEPENDENT_AMBULATORY_CARE_PROVIDER_SITE_OTHER): Payer: 59 | Admitting: Family Medicine

## 2016-05-25 VITALS — BP 121/81 | HR 93 | Temp 98.0°F | Resp 16 | Ht 75.0 in | Wt 227.0 lb

## 2016-05-25 DIAGNOSIS — M549 Dorsalgia, unspecified: Secondary | ICD-10-CM | POA: Diagnosis not present

## 2016-05-25 DIAGNOSIS — G8929 Other chronic pain: Secondary | ICD-10-CM | POA: Diagnosis not present

## 2016-05-25 DIAGNOSIS — M542 Cervicalgia: Secondary | ICD-10-CM

## 2016-05-25 DIAGNOSIS — G4700 Insomnia, unspecified: Secondary | ICD-10-CM

## 2016-05-25 NOTE — Progress Notes (Signed)
Name: Seth [name]   MRN: [id number]    DOB: 1961-05-21   Date:05/25/2016       Progress Note  Subjective  Chief Complaint  Chief Complaint  Patient presents with  . Annual Exam    HPI Here for annual physical exam.  He has chronic back pain.   Controlled with meds at present.  He is pre-diabetic.  He has gained some weight.  Not watching diet well at present.  No real c/o.  He has some problems with neck and back pain and oc affects sleep.  No problem-specific Assessment & Plan notes found for this encounter.   Past Medical History:  Diagnosis Date  . Degenerative disc disease, cervical   . Degenerative disc disease, lumbar     Past Surgical History:  Procedure Laterality Date  . BLADDER SURGERY  2002   removed scar tissue  . CHOLECYSTECTOMY      Family History  Problem Relation Age of Onset  . Diabetes Mother   . Cancer Father   . Heart disease Father     Social History   Social History  . Marital status: Married    Spouse name: N/A  . Number of children: N/A  . Years of education: N/A   Occupational History  . Not on file.   Social History Main Topics  . Smoking status: Current Every Day Smoker    Packs/day: 1.00    Types: Cigarettes  . Smokeless tobacco: Not on file  . Alcohol use No     Comment: occasionally  . Drug use: No  . Sexual activity: Yes   Other Topics Concern  . Not on file   Social History Narrative  . No narrative on file     Current Outpatient Prescriptions:  .  traMADol (ULTRAM) 50 MG tablet, Take 50 mg by mouth 3 (three) times daily., Disp: , Rfl: 2 .  venlafaxine XR (EFFEXOR XR) 150 MG 24 hr capsule, Take 1 capsule (150 mg total) by mouth daily with breakfast., Disp: 30 capsule, Rfl: 6  Not on File   Review of Systems  Constitutional: Negative for chills, fever, malaise/fatigue and weight loss.  HENT: Negative for hearing loss.   Eyes: Negative for blurred vision and double vision.  Respiratory: Negative for  cough, shortness of breath and wheezing.   Cardiovascular: Negative for chest pain, orthopnea and leg swelling.  Gastrointestinal: Negative for abdominal pain, blood in stool, constipation, diarrhea, heartburn, nausea and vomiting.  Genitourinary: Negative for dysuria, frequency and urgency.  Musculoskeletal: Positive for joint pain (dioffuse). Negative for myalgias.  Skin: Negative for rash.  Neurological: Negative for dizziness, tremors, weakness and headaches.      Objective  Vitals:   05/25/16 0902  BP: 121/81  Pulse: 93  Resp: 16  Temp: 98 F (36.7 C)  TempSrc: Oral  Weight: 227 lb (103 kg)  Height: '6\' 3"'  (1.905 m)    Physical Exam  Constitutional: He is oriented to person, place, and time and well-developed, well-nourished, and in no distress. No distress.  HENT:  Head: Normocephalic and atraumatic.  Right Ear: External ear normal.  Left Ear: External ear normal.  Nose: Nose normal.  Mouth/Throat: Oropharynx is clear and moist.  Eyes: Conjunctivae and EOM are normal. Pupils are equal, round, and reactive to light. No scleral icterus.  Neck: Normal range of motion. Neck supple. Carotid bruit is not present. No thyromegaly present.  Cardiovascular: Normal rate, regular rhythm and normal heart sounds.  Exam reveals no  gallop and no friction rub.   No murmur heard. Pulmonary/Chest: Effort normal and breath sounds normal. No respiratory distress. He has no wheezes. He has no rales.  Abdominal: Soft. Bowel sounds are normal. He exhibits no distension, no abdominal bruit and no mass. There is no tenderness.  Genitourinary: Penis normal. He exhibits no abnormal testicular mass, no testicular tenderness and no abnormal scrotal mass. Penis exhibits no lesions and no edema.  Genitourinary Comments: L varicocele that is non-tender  Musculoskeletal: Normal range of motion. He exhibits no edema or deformity.  Lymphadenopathy:    He has no cervical adenopathy.  Neurological: He is  alert and oriented to person, place, and time.  Psychiatric: Mood, memory, affect and judgment normal.  Vitals reviewed.      Recent Results (from the past 2160 hour(s))  COMPLETE METABOLIC PANEL WITH GFR     Status: Abnormal   Collection Time: 05/20/16  8:40 AM  Result Value Ref Range   Sodium 138 135 - 146 mmol/L   Potassium 4.5 3.5 - 5.3 mmol/L   Chloride 103 98 - 110 mmol/L   CO2 24 20 - 31 mmol/L   Glucose, Bld 107 (H) 65 - 99 mg/dL   BUN 17 7 - 25 mg/dL   Creat 1.06 0.70 - 1.33 mg/dL    Comment:   For patients > or = 53 years of age: The upper reference limit for Creatinine is approximately 13% higher for people identified as African-American.      Total Bilirubin 0.6 0.2 - 1.2 mg/dL   Alkaline Phosphatase 49 40 - 115 U/L   AST 11 10 - 35 U/L   ALT 14 9 - 46 U/L   Total Protein 6.9 6.1 - 8.1 g/dL   Albumin 4.1 3.6 - 5.1 g/dL   Calcium 8.9 8.6 - 10.3 mg/dL   GFR, Est African American >89 >=60 mL/min   GFR, Est Non African American 80 >=60 mL/min  CBC with Differential/Platelet     Status: None   Collection Time: 05/20/16  8:40 AM  Result Value Ref Range   WBC 8.0 3.8 - 10.8 K/uL   RBC 5.12 4.20 - 5.80 MIL/uL   Hemoglobin 15.0 13.2 - 17.1 g/dL   HCT 44.0 38.5 - 50.0 %   MCV 85.9 80.0 - 100.0 fL   MCH 29.3 27.0 - 33.0 pg   MCHC 34.1 32.0 - 36.0 g/dL   RDW 13.4 11.0 - 15.0 %   Platelets 280 140 - 400 K/uL   MPV 9.5 7.5 - 12.5 fL   Neutro Abs 4,560 1,500 - 7,800 cells/uL   Lymphs Abs 2,480 850 - 3,900 cells/uL   Monocytes Absolute 720 200 - 950 cells/uL   Eosinophils Absolute 160 15 - 500 cells/uL   Basophils Absolute 80 0 - 200 cells/uL   Neutrophils Relative % 57 %   Lymphocytes Relative 31 %   Monocytes Relative 9 %   Eosinophils Relative 2 %   Basophils Relative 1 %   Smear Review Criteria for review not met     Comment: ** Please note change in unit of measure and reference range(s). **  Lipid panel     Status: Abnormal   Collection Time: 05/20/16   8:40 AM  Result Value Ref Range   Cholesterol 162 125 - 200 mg/dL   Triglycerides 164 (H) <150 mg/dL   HDL 27 (L) >=40 mg/dL   Total CHOL/HDL Ratio 6.0 (H) <=5.0 Ratio   VLDL 33 (H) <30 mg/dL  LDL Cholesterol 102 <130 mg/dL    Comment:   Total Cholesterol/HDL Ratio:CHD Risk                        Coronary Heart Disease Risk Table                                        Men       Women          1/2 Average Risk              3.4        3.3              Average Risk              5.0        4.4           2X Average Risk              9.6        7.1           3X Average Risk             23.4       11.0 Use the calculated Patient Ratio above and the CHD Risk table  to determine the patient's CHD Risk.   Hemoglobin A1c     Status: Abnormal   Collection Time: 05/20/16  8:40 AM  Result Value Ref Range   Hgb A1c MFr Bld 6.2 (H) <5.7 %    Comment:   For someone without known diabetes, a hemoglobin A1c value between 5.7% and 6.4% is consistent with prediabetes and should be confirmed with a follow-up test.   For someone with known diabetes, a value <7% indicates that their diabetes is well controlled. A1c targets should be individualized based on duration of diabetes, age, co-morbid conditions and other considerations.   This assay result is consistent with an increased risk of diabetes.   Currently, no consensus exists regarding use of hemoglobin A1c for diagnosis of diabetes in children.      Mean Plasma Glucose 131 mg/dL  PSA     Status: None   Collection Time: 05/20/16  8:40 AM  Result Value Ref Range   PSA 0.74 <=4.00 ng/mL    Comment: Test Methodology: ECLIA PSA (Electrochemiluminescence Immunoassay)   For PSA values from 2.5-4.0, particularly in younger men <5 years old, the AUA and NCCN suggest testing for % Free PSA (3515) and evaluation of the rate of increase in PSA (PSA velocity).      Assessment & Plan  Problem List Items Addressed This Visit      Other    Chronic back pain - Primary    Other Visit Diagnoses    Chronic neck pain       Insomnia          No orders of the defined types were placed in this encounter.  1. Chronic back pain Cont Tramadol prn  2. Chronic neck pain   3. Insomnia Cont Effexor

## 2016-08-17 ENCOUNTER — Ambulatory Visit
Admission: RE | Admit: 2016-08-17 | Discharge: 2016-08-17 | Disposition: A | Discharge status: Home / Self Care | Payer: 59 | Source: Ambulatory Visit | Attending: Nurse Practitioner | Admitting: Nurse Practitioner

## 2016-08-17 ENCOUNTER — Other Ambulatory Visit: Payer: Self-pay | Admitting: Nurse Practitioner

## 2016-08-17 DIAGNOSIS — M5136 Other intervertebral disc degeneration, lumbar region: Secondary | ICD-10-CM

## 2016-08-17 DIAGNOSIS — M47899 Other spondylosis, site unspecified: Secondary | ICD-10-CM | POA: Insufficient documentation

## 2016-12-08 ENCOUNTER — Encounter: Payer: Self-pay | Admitting: *Deleted

## 2016-12-08 ENCOUNTER — Ambulatory Visit (INDEPENDENT_AMBULATORY_CARE_PROVIDER_SITE_OTHER): Payer: Managed Care, Other (non HMO) | Admitting: Family Medicine

## 2016-12-08 ENCOUNTER — Encounter: Payer: Self-pay | Admitting: Family Medicine

## 2016-12-08 VITALS — BP 130/88 | HR 96 | Temp 98.7°F | Resp 16 | Ht 75.0 in | Wt 219.0 lb

## 2016-12-08 DIAGNOSIS — J0100 Acute maxillary sinusitis, unspecified: Secondary | ICD-10-CM

## 2016-12-08 MED ORDER — GUAIFENESIN ER 600 MG PO TB12: 600.0000 mg | ORAL_TABLET | Freq: Two times a day (BID) | ORAL | 0 refills | Status: DC

## 2016-12-08 MED ORDER — AMOXICILLIN-POT CLAVULANATE 875-125 MG PO TABS: 1.0000 | ORAL_TABLET | Freq: Two times a day (BID) | ORAL | 0 refills | Status: DC

## 2016-12-08 NOTE — Progress Notes (Signed)
Name: Seth [name]   MRN: [id number]    DOB: May 18, 1961   Date:12/08/2016       Progress Note  Subjective  Chief Complaint  Chief Complaint  Patient presents with  . Sinusitis    HPI He has had on and off sinus congestion with headaches since the end of Dec., 2017.  More HAs now and pressure in face.  Some bloody mucus last week.  No fever.  No cough. No problem-specific Assessment & Plan notes found for this encounter.   Past Medical History:  Diagnosis Date  . Degenerative disc disease, cervical   . Degenerative disc disease, lumbar     Social History  Substance Use Topics  . Smoking status: Current Every Day Smoker    Packs/day: 1.00    Types: Cigarettes  . Smokeless tobacco: Never Used  . Alcohol use No     Comment: occasionally     Current Outpatient Prescriptions:  .  traMADol (ULTRAM) 50 MG tablet, Take 50 mg by mouth 3 (three) times daily., Disp: , Rfl: 2 .  venlafaxine XR (EFFEXOR XR) 150 MG 24 hr capsule, Take 1 capsule (150 mg total) by mouth daily with breakfast., Disp: 30 capsule, Rfl: 6 .  amoxicillin-clavulanate (AUGMENTIN) 875-125 MG tablet, Take 1 tablet by mouth 2 (two) times daily., Disp: 20 tablet, Rfl: 0 .  guaiFENesin (MUCINEX) 600 MG 12 hr tablet, Take 1 tablet (600 mg total) by mouth 2 (two) times daily., Disp: 30 tablet, Rfl: 0  No Known Allergies  Review of Systems  Constitutional: Negative for chills, fever, malaise/fatigue and weight loss.  HENT: Positive for congestion and sinus pain. Negative for hearing loss and tinnitus.   Eyes: Negative for blurred vision and double vision.  Respiratory: Negative for cough, shortness of breath and wheezing.   Cardiovascular: Negative for chest pain, palpitations and leg swelling.  Gastrointestinal: Negative for abdominal pain, blood in stool and heartburn.  Genitourinary: Negative for dysuria, frequency and urgency.  Musculoskeletal: Negative for joint pain and myalgias.  Skin: Negative for rash.   Neurological: Negative for dizziness, tingling, tremors, weakness and headaches.      Objective  Vitals:   12/08/16 1504 12/08/16 1520  BP: 130/88   Pulse: (!) 109 96  Resp: 16   Temp: 98.7 F (37.1 C)   TempSrc: Oral   Weight: 219 lb (99.3 kg)   Height: 6\' 3"  (1.905 m)      Physical Exam  Constitutional: He is well-developed, well-nourished, and in no distress. No distress.  HENT:  Head: Normocephalic and atraumatic.  Right Ear: External ear normal.  Left Ear: External ear normal.  Nose: No mucosal edema or rhinorrhea. Right sinus exhibits maxillary sinus tenderness. Right sinus exhibits no frontal sinus tenderness. Left sinus exhibits maxillary sinus tenderness. Left sinus exhibits no frontal sinus tenderness.  Mouth/Throat: Oropharynx is clear and moist.  Neck: Normal range of motion. Neck supple.  Cardiovascular: Normal rate, regular rhythm and normal heart sounds.   Lymphadenopathy:    He has no cervical adenopathy.  Vitals reviewed.     No results found for this or any previous visit (from the past 2160 hour(s)).   Assessment & Plan  1. Acute non-recurrent maxillary sinusitis  - amoxicillin-clavulanate (AUGMENTIN) 875-125 MG tablet; Take 1 tablet by mouth 2 (two) times daily.  Dispense: 20 tablet; Refill: 0 - guaiFENesin (MUCINEX) 600 MG 12 hr tablet; Take 1 tablet (600 mg total) by mouth 2 (two) times daily.  Dispense: 30 tablet; Refill:  0    

## 2016-12-09 ENCOUNTER — Other Ambulatory Visit: Payer: Self-pay | Admitting: Family Medicine

## 2016-12-09 ENCOUNTER — Telehealth: Payer: Self-pay | Admitting: Family Medicine

## 2016-12-09 DIAGNOSIS — J431 Panlobular emphysema: Secondary | ICD-10-CM

## 2016-12-09 NOTE — Telephone Encounter (Signed)
Pt needs a refill on effexor sent to pharmacy.

## 2016-12-10 ENCOUNTER — Other Ambulatory Visit: Payer: Self-pay | Admitting: Family Medicine

## 2016-12-10 DIAGNOSIS — M5442 Lumbago with sciatica, left side: Principal | ICD-10-CM

## 2016-12-10 DIAGNOSIS — M5441 Lumbago with sciatica, right side: Principal | ICD-10-CM

## 2016-12-10 DIAGNOSIS — J431 Panlobular emphysema: Secondary | ICD-10-CM

## 2016-12-10 DIAGNOSIS — G8929 Other chronic pain: Secondary | ICD-10-CM

## 2016-12-10 MED ORDER — VENLAFAXINE HCL ER 150 MG PO CP24: 150.0000 mg | ORAL_CAPSULE | Freq: Every day | ORAL | 12 refills | Status: DC

## 2016-12-10 NOTE — Telephone Encounter (Signed)
Done.-jh 

## 2017-08-18 ENCOUNTER — Encounter: Payer: Self-pay | Admitting: Family Medicine

## 2017-08-18 ENCOUNTER — Ambulatory Visit (INDEPENDENT_AMBULATORY_CARE_PROVIDER_SITE_OTHER): Payer: Managed Care, Other (non HMO) | Admitting: Family Medicine

## 2017-08-18 VITALS — BP 126/77 | HR 88 | Temp 98.4°F | Resp 16 | Ht 75.0 in | Wt 225.8 lb

## 2017-08-18 DIAGNOSIS — M5030 Other cervical disc degeneration, unspecified cervical region: Secondary | ICD-10-CM | POA: Diagnosis not present

## 2017-08-18 DIAGNOSIS — J431 Panlobular emphysema: Secondary | ICD-10-CM | POA: Diagnosis not present

## 2017-08-18 DIAGNOSIS — M159 Polyosteoarthritis, unspecified: Secondary | ICD-10-CM

## 2017-08-18 DIAGNOSIS — Z1211 Encounter for screening for malignant neoplasm of colon: Secondary | ICD-10-CM

## 2017-08-18 DIAGNOSIS — Z1159 Encounter for screening for other viral diseases: Secondary | ICD-10-CM

## 2017-08-18 DIAGNOSIS — M5136 Other intervertebral disc degeneration, lumbar region: Secondary | ICD-10-CM

## 2017-08-18 DIAGNOSIS — Z720 Tobacco use: Secondary | ICD-10-CM

## 2017-08-18 DIAGNOSIS — E785 Hyperlipidemia, unspecified: Secondary | ICD-10-CM | POA: Insufficient documentation

## 2017-08-18 DIAGNOSIS — R7303 Prediabetes: Secondary | ICD-10-CM

## 2017-08-18 DIAGNOSIS — E782 Mixed hyperlipidemia: Secondary | ICD-10-CM | POA: Diagnosis not present

## 2017-08-18 DIAGNOSIS — Z125 Encounter for screening for malignant neoplasm of prostate: Secondary | ICD-10-CM | POA: Diagnosis not present

## 2017-08-18 DIAGNOSIS — Z0000 Encounter for general adult medical examination without abnormal findings: Secondary | ICD-10-CM

## 2017-08-18 DIAGNOSIS — M150 Primary generalized (osteo)arthritis: Secondary | ICD-10-CM | POA: Diagnosis not present

## 2017-08-18 NOTE — Assessment & Plan Note (Signed)
Uncertain control, previously limited control with A1c 6.2-6.3  Plan:  1. Not on any therapy currently  2. Encourage improved lifestyle - low carb, low sugar diet, reduce portion size, resume regular exercise 3. Check fasting labs and A1c today - pending result will make lifestyle or medication recommendations if >6.2 consider add low dose Metformin will review further with patient - follow-up 3-6 months PreDM A1c pending result

## 2017-08-18 NOTE — Progress Notes (Signed)
Subjective:    Patient ID: Seth [name], male    DOB: 27-Dec-1960, 56 y.o.   MRN: [id number]  Seth [name] is a 56 y.o. male presenting on 08/18/2017 for Annual Exam   HPI   Here for Annual Exam and fasting labs.  Pre-Diabetes Reports his history of Pre-DM with some fluctuation, worse with diet CBGs: None Meds: Never on Currently NOT ACEi / ARB Lifestyle: - Diet (worse with diet if increases bread intake, seems to fluctuate, less fast food, drinks water, unsweet tea and coffee, rare to no sodas)  - Exercise (Limited regular exercise, active working, he works as Dealer for [employer]) Denies hypoglycemia, polyuria, visual changes, numbness or tingling.  HYPERLIPIDEMIA: - Reports no concerns. Last lipid panel 05/2016, with abnormal results with low HDL and elevated TG - No cholesterol medicine - he is fasting today only had black coffee, ready for labs  COPD / TOBACCO ABUSE: - Active smoker, 1ppd for up to 15 years - Previously quit for 10 years, then restarted.  He quit cold [place] before. In past tried NRT with limited results - Not ready to quit, he plans to future quit smoking surgery, goal to quit within 2.5 years anticipates may have upcoming neck surgery - Previous dx of COPD with emphysema, without known complication. He does not use inhalers and no recent flare up  DJD in Cervical and Lumbar Spine / Osteoarthritis in joints hands/knees etc - History in past with lab testing for rheumatoid arthritis, and reportedly had some abnormal values, he saw Rheumatologist and ultimately was not concerned with lab results and thought more osteoarthritis - Describes chronic wear and tear from occupation and old injuries - He has chronic intermittent pain sometimes in neck and upper extremity R > L with some paresthesia and tingling at times, only intermittent with flares - Currently taking Tramadol 110m TID - neck and back [facility]) - for 10 years for neck - Also taking Venlafaxine XR 1564m- 24 hr for low back pain - Last X-ray L spine in 08/2016, no available C-spine x-rays or other imaging  Health Maintenance: - UTD Flu Shot - Unsure last TDap, will defer - Due for routine Hepatitis C screening - Colon CA Screening: [facility]16[facility] results with no polyps, good for 10 years, due in 07/2024. Currently asymptomatic. No known family history of colon CA. - Prostate CA Screening: Prior PSA / DRE reported normal 1 year ago unremarkable. Last PSA 0.74 (05/2016). Currently asymptomatic. No known family history of prostate CA. Due for screening PSA  [facility] 08/18/2017 12/08/2016 05/25/2016  Decreased Interest 0 0 0  Down, Depressed, Hopeless 0 0 0  PHQ - 2 Score 0 0 0    Past Medical History:  Diagnosis Date  . Degenerative disc disease, cervical   . Degenerative disc disease, lumbar    Past Surgical History:  Procedure Laterality Date  . BLADDER SURGERY  2002   removed scar tissue  . CHOLECYSTECTOMY     Social History   Social History  . Marital status: Married    Spouse name: N/A  . Number of children: N/A  . Years of education: N/A   Occupational History  . Not on file.   Social History Main Topics  . Smoking status: Current Every Day Smoker    Packs/day: 1.00    Years: 15.00    Types:  Cigarettes  . Smokeless tobacco: Current User  . Alcohol use No     Comment: occasionally  . Drug use: No  . Sexual activity: Yes   Other Topics Concern  . Not on file   Social History Narrative  . No narrative on file   Family History  Problem Relation Age of Onset  . Diabetes Mother   . Cancer Mother        bone  . Cancer Father   . Heart disease Father    Current Outpatient Prescriptions on File Prior to Visit  Medication Sig  . traMADol (ULTRAM) 50 MG tablet Take 50 mg by mouth 3 (three) times daily.  Marland Kitchen venlafaxine  XR (EFFEXOR XR) 150 MG 24 hr capsule Take 1 capsule (150 mg total) by mouth daily with breakfast.   No current facility-administered medications on file prior to visit.     Review of Systems  Constitutional: Negative for activity change, appetite change, chills, diaphoresis, fatigue, fever and unexpected weight change.  HENT: Negative for congestion, hearing loss and sinus pressure.   Eyes: Negative for visual disturbance.  Respiratory: Negative for apnea, cough, chest tightness, shortness of breath and wheezing.   Cardiovascular: Negative for chest pain, palpitations and leg swelling.  Gastrointestinal: Negative for abdominal distention, abdominal pain, anal bleeding, blood in stool, constipation, diarrhea, nausea and vomiting.  Endocrine: Negative for cold intolerance and polyuria.  Genitourinary: Negative for decreased urine volume, difficulty urinating, dysuria, frequency, hematuria, testicular pain and urgency.  Musculoskeletal: Positive for back pain and neck pain. Negative for arthralgias and neck stiffness.  Skin: Negative for rash.  Allergic/Immunologic: Negative for environmental allergies.  Neurological: Negative for dizziness, weakness, light-headedness, numbness and headaches.  Hematological: Negative for adenopathy.  Psychiatric/Behavioral: Negative for behavioral problems, dysphoric mood and sleep disturbance.   Per HPI unless specifically indicated above     Objective:    BP 126/77   Pulse 88   Temp 98.4 F (36.9 C) (Oral)   Resp 16   Ht '6\' 3"'  (1.905 m)   Wt 225 lb 12.8 oz (102.4 kg)   BMI 28.22 kg/m   Wt Readings from Last 3 Encounters:  08/18/17 225 lb 12.8 oz (102.4 kg)  12/08/16 219 lb (99.3 kg)  05/25/16 227 lb (103 kg)    Physical Exam  Constitutional: He is oriented to person, place, and time. He appears well-developed and well-nourished. No distress.  Well-appearing, comfortable, cooperative  HENT:  Head: Normocephalic and atraumatic.    Mouth/Throat: Oropharynx is clear and moist.  Eyes: Pupils are equal, round, and reactive to light. Conjunctivae and EOM are normal. Right eye exhibits no discharge. Left eye exhibits no discharge.  Neck: Normal range of motion. Neck supple. No thyromegaly present.  Cardiovascular: Normal rate, regular rhythm, normal heart sounds and intact distal pulses.   No murmur heard. Pulmonary/Chest: Effort normal and breath sounds normal. No respiratory distress. He has no wheezes. He has no rales.  Abdominal: Soft. Bowel sounds are normal. He exhibits no distension and no mass. There is no tenderness.  Genitourinary:  Genitourinary Comments: Deferred DRE  Musculoskeletal: Normal range of motion. He exhibits no edema or tenderness.  Upper / Lower Extremities: - Normal muscle tone, strength bilateral upper extremities 5/5, lower extremities 5/5  No deformity hands wrist or upper extremity  Lymphadenopathy:    He has no cervical adenopathy.  Neurological: He is alert and oriented to person, place, and time.  Distal sensation intact to light touch all extremities, currently  Skin: Skin is warm and dry. No rash noted. He is not diaphoretic. No erythema.  Psychiatric: He has a normal mood and affect. His behavior is normal.  Well groomed, good eye contact, normal speech and thoughts  Nursing note and vitals reviewed.    Results for orders placed or performed in visit on 05/20/16  COMPLETE METABOLIC PANEL WITH GFR  Result Value Ref Range   Sodium 138 135 - 146 mmol/L   Potassium 4.5 3.5 - 5.3 mmol/L   Chloride 103 98 - 110 mmol/L   CO2 24 20 - 31 mmol/L   Glucose, Bld 107 (H) 65 - 99 mg/dL   BUN 17 7 - 25 mg/dL   Creat 1.06 0.70 - 1.33 mg/dL   Total Bilirubin 0.6 0.2 - 1.2 mg/dL   Alkaline Phosphatase 49 40 - 115 U/L   AST 11 10 - 35 U/L   ALT 14 9 - 46 U/L   Total Protein 6.9 6.1 - 8.1 g/dL   Albumin 4.1 3.6 - 5.1 g/dL   Calcium 8.9 8.6 - 10.3 mg/dL   GFR, Est African American >89 >=60  mL/min   GFR, Est Non African American 80 >=60 mL/min  CBC with Differential/Platelet  Result Value Ref Range   WBC 8.0 3.8 - 10.8 K/uL   RBC 5.12 4.20 - 5.80 MIL/uL   Hemoglobin 15.0 13.2 - 17.1 g/dL   HCT 44.0 38.5 - 50.0 %   MCV 85.9 80.0 - 100.0 fL   MCH 29.3 27.0 - 33.0 pg   MCHC 34.1 32.0 - 36.0 g/dL   RDW 13.4 11.0 - 15.0 %   Platelets 280 140 - 400 K/uL   MPV 9.5 7.5 - 12.5 fL   Neutro Abs 4,560 1,500 - 7,800 cells/uL   Lymphs Abs 2,480 850 - 3,900 cells/uL   Monocytes Absolute 720 200 - 950 cells/uL   Eosinophils Absolute 160 15 - 500 cells/uL   Basophils Absolute 80 0 - 200 cells/uL   Neutrophils Relative % 57 %   Lymphocytes Relative 31 %   Monocytes Relative 9 %   Eosinophils Relative 2 %   Basophils Relative 1 %   Smear Review Criteria for review not met   Lipid panel  Result Value Ref Range   Cholesterol 162 125 - 200 mg/dL   Triglycerides 164 (H) <150 mg/dL   HDL 27 (L) >=40 mg/dL   Total CHOL/HDL Ratio 6.0 (H) <=5.0 Ratio   VLDL 33 (H) <30 mg/dL   LDL Cholesterol 102 <130 mg/dL  Hemoglobin A1c  Result Value Ref Range   Hgb A1c MFr Bld 6.2 (H) <5.7 %   Mean Plasma Glucose 131 mg/dL  PSA  Result Value Ref Range   PSA 0.74 <=4.00 ng/mL      Assessment & Plan:   Problem List Items Addressed This Visit    COPD (chronic obstructive pulmonary disease) (HCC)    Stable COPD from prior diagnosis Active smoker, chronic tobacco abuse Not on maintenance inhalers No new complaints or flare-ups Smoking cessation, not ready to quit Follow-up in future may benefit from PFTs staging severity, maintenance therapy      Degenerative disc disease, lumbar   Disc disease, degenerative, cervical   Hyperlipidemia    Mildly Uncontrolled cholesterol on some recently poor lifestyle limited exercise, but previously was doing better Last lipid panel 05/2016 Calculated ASCVD 10 yr risk score pending new lipids  Plan: 1. Briefly reviewed ASCVD risk reduction - hold off on  further discussion and  consider statin pending lipid results. Future consider ASA 81 as wel 2. Encourage improved lifestyle - low carb/cholesterol, reduce portion size, continue improving regular exercise 3. Check fasting lipids and labs today 4. Follow-up 3-6 months pending lipid results - otherwise yearly labs      Relevant Orders   COMPLETE METABOLIC PANEL WITH GFR   Lipid panel   Osteoarthritis    Chronic problem with multiple joint primarily cervical and lumbar spine with DDD, resulting in chronic pain - Known degenerative arthritis in other joints as well, upper / lower extremity - Followed by Pain Management ([place]) [facility] >10 years - Not followed by Ortho currently. No prior spine surgery - Continues on Tramadol and SNRI Effexor for pain - Follow-up as needed      Pre-diabetes    Uncertain control, previously limited control with A1c 6.2-6.3  Plan:  1. Not on any therapy currently  2. Encourage improved lifestyle - low carb, low sugar diet, reduce portion size, resume regular exercise 3. Check fasting labs and A1c today - pending result will make lifestyle or medication recommendations if >6.2 consider add low dose Metformin will review further with patient - follow-up 3-6 months PreDM A1c pending result      Relevant Orders   COMPLETE METABOLIC PANEL WITH GFR   Hemoglobin A1c   Screening for colon cancer    UTD on colonoscopy by report, last 07/16/14 by Dr Elenora Gamma GI, no polyps return in 10 years Follow-up as planned, return sooner if symptoms      Screening for prostate cancer    Avg / Low risk patient, with prior reported negative screening - Last PSA 0.74 (05/2016), prior values normal - Last DRE 1 years ago reported normal - Clinically asymptomatic  Plan: 1. Reviewed prostate cancer screening guidelines and risks including potential prostate biopsy if abnormal PSA, proceed with yearly PSA for now, and anticipate DRE as needed especially if abnormal  PSA or new symptoms      Relevant Orders   PSA, Total with Reflex to PSA, Free   Tobacco abuse    Active smoker, 1ppd for 15 yr Prior quit 10 yr without meds or NRT Not ready to quit currently has timeline of up to 2.5 years before he thinks may be ready pending life events Would consider meds but likely try quit on own again May consider future Low Dose Lung CT, however does not meet age >77 or smoke >30 pack years  Discussion today >5 minutes (<10 minutes) specifically on counseling on risks of tobacco use, complications, treatment, smoking cessation.       Other Visit Diagnoses    Annual physical exam    -  Primary   Relevant Orders   COMPLETE METABOLIC PANEL WITH GFR   Lipid panel   CBC with Differential/Platelet   Hemoglobin A1c   Need for hepatitis C screening test       Relevant Orders   Hepatitis C antibody      No orders of the defined types were placed in this encounter.   Follow up plan: Return in about 6 months (around 02/16/2018) for Pre-Diabetes A1c.  Nobie Putnam, [facility] 08/18/2017, 1:22 PM

## 2017-08-18 NOTE — Assessment & Plan Note (Signed)
Chronic problem with multiple joint primarily cervical and lumbar spine with DDD, resulting in chronic pain - Known degenerative arthritis in other joints as well, upper / lower extremity - Followed by Pain Management ([place]) [place] >10 years - Not followed by Ortho currently. No prior spine surgery - Continues on Tramadol and SNRI Effexor for pain - Follow-up as needed

## 2017-08-18 NOTE — Assessment & Plan Note (Signed)
Active smoker, 1ppd for 15 yr Prior quit 10 yr without meds or NRT Not ready to quit currently has timeline of up to 2.5 years before he thinks may be ready pending life events Would consider meds but likely try quit on own again May consider future Low Dose Lung CT, however does not meet age >62 or smoke >30 pack years  Discussion today >5 minutes (<10 minutes) specifically on counseling on risks of tobacco use, complications, treatment, smoking cessation.

## 2017-08-18 NOTE — Assessment & Plan Note (Signed)
UTD on colonoscopy by report, last 07/16/14 by Dr Elenora Gamma GI, no polyps return in 10 years Follow-up as planned, return sooner if symptoms

## 2017-08-18 NOTE — Assessment & Plan Note (Signed)
Mildly Uncontrolled cholesterol on some recently poor lifestyle limited exercise, but previously was doing better Last lipid panel 05/2016 Calculated ASCVD 10 yr risk score pending new lipids  Plan: 1. Briefly reviewed ASCVD risk reduction - hold off on further discussion and consider statin pending lipid results. Future consider ASA 81 as wel 2. Encourage improved lifestyle - low carb/cholesterol, reduce portion size, continue improving regular exercise 3. Check fasting lipids and labs today 4. Follow-up 3-6 months pending lipid results - otherwise yearly labs

## 2017-08-18 NOTE — Patient Instructions (Addendum)
Thank you for coming to the clinic today.  1. Try to keep improving diabetic diet to reduce carbs and breads and sugars  If A1c is elevated >6.2-6.3, we may bring you back in 3 months  ---------------------------   DUE for FASTING BLOOD WORK (no food or drink after midnight before the lab appointment, only water or coffee without cream/sugar on the morning of)  TODAY  - Make sure Lab Only appointment is at about 1 week before your next appointment, so that results will be available  For Lab Results, once available within 2-3 days of blood draw, you can can log in to MyChart online to view your results and a brief explanation. Also, we can discuss results at next follow-up visit.  Please schedule a Follow-up Appointment to: Return in about 6 months (around 02/16/2018) for Pre-Diabetes A1c.  If you have any other questions or concerns, please feel free to call the clinic or send a message through [place]. You may also schedule an earlier appointment if necessary.  Additionally, you may be receiving a survey about your experience at our clinic within a few days to 1 week by e-mail or mail. We value your feedback.  Nobie Putnam, DO [place]

## 2017-08-18 NOTE — Assessment & Plan Note (Signed)
Avg / Low risk patient, with prior reported negative screening - Last PSA 0.74 (05/2016), prior values normal - Last DRE 1 years ago reported normal - Clinically asymptomatic  Plan: 1. Reviewed prostate cancer screening guidelines and risks including potential prostate biopsy if abnormal PSA, proceed with yearly PSA for now, and anticipate DRE as needed especially if abnormal PSA or new symptoms

## 2017-08-18 NOTE — Assessment & Plan Note (Addendum)
Stable COPD from prior diagnosis Active smoker, chronic tobacco abuse Not on maintenance inhalers No new complaints or flare-ups Smoking cessation, not ready to quit Follow-up in future may benefit from PFTs staging severity, maintenance therapy

## 2017-08-19 LAB — CBC WITH DIFFERENTIAL/PLATELET
BASOS PCT: 1.1 %
Basophils Absolute: 94 cells/uL (ref 0–200)
EOS PCT: 2.1 %
Eosinophils Absolute: 179 cells/uL (ref 15–500)
HCT: 42.4 % (ref 38.5–50.0)
Hemoglobin: 14.7 g/dL (ref 13.2–17.1)
Lymphs Abs: 3264 cells/uL (ref 850–3900)
MCH: 29.5 pg (ref 27.0–33.0)
MCHC: 34.7 g/dL (ref 32.0–36.0)
MCV: 85.1 fL (ref 80.0–100.0)
MONOS PCT: 7 %
MPV: 10 fL (ref 7.5–12.5)
NEUTROS PCT: 51.4 %
Neutro Abs: 4369 cells/uL (ref 1500–7800)
PLATELETS: 291 10*3/uL (ref 140–400)
RBC: 4.98 10*6/uL (ref 4.20–5.80)
RDW: 12.5 % (ref 11.0–15.0)
TOTAL LYMPHOCYTE: 38.4 %
WBC: 8.5 10*3/uL (ref 3.8–10.8)
WBCMIX: 595 {cells}/uL (ref 200–950)

## 2017-08-19 LAB — COMPLETE METABOLIC PANEL WITH GFR
AG Ratio: 1.5 (calc) (ref 1.0–2.5)
ALBUMIN MSPROF: 4 g/dL (ref 3.6–5.1)
ALT: 15 U/L (ref 9–46)
AST: 13 U/L (ref 10–35)
Alkaline phosphatase (APISO): 54 U/L (ref 40–115)
BILIRUBIN TOTAL: 0.6 mg/dL (ref 0.2–1.2)
BUN: 16 mg/dL (ref 7–25)
CO2: 27 mmol/L (ref 20–32)
Calcium: 9.2 mg/dL (ref 8.6–10.3)
Chloride: 104 mmol/L (ref 98–110)
Creat: 1.09 mg/dL (ref 0.70–1.33)
GFR, EST AFRICAN AMERICAN: 89 mL/min/{1.73_m2} (ref >=60)
GFR, Est Non African American: 77 mL/min/{1.73_m2} (ref >=60)
GLUCOSE: 94 mg/dL (ref 65–99)
Globulin: 2.7 g/dL (calc) (ref 1.9–3.7)
Potassium: 4.1 mmol/L (ref 3.5–5.3)
Sodium: 138 mmol/L (ref 135–146)
TOTAL PROTEIN: 6.7 g/dL (ref 6.1–8.1)

## 2017-08-19 LAB — HEMOGLOBIN A1C
EAG (MMOL/L): 7 (calc)
HEMOGLOBIN A1C: 6 %{Hb} — AB (ref <5.7)
MEAN PLASMA GLUCOSE: 126 (calc)

## 2017-08-19 LAB — PSA, TOTAL WITH REFLEX TO PSA, FREE: PSA, Total: 0.7 ng/mL (ref <=4.0)

## 2017-08-19 LAB — HEPATITIS C ANTIBODY
Hepatitis C Ab: NONREACTIVE
SIGNAL TO CUT-OFF: 0.02 (ref <1.00)

## 2017-08-19 LAB — LIPID PANEL
CHOL/HDL RATIO: 6.1 (calc) — AB (ref <5.0)
Cholesterol: 171 mg/dL (ref <200)
HDL: 28 mg/dL — ABNORMAL LOW (ref >40)
LDL CHOLESTEROL (CALC): 113 mg/dL — AB
NON-HDL CHOLESTEROL (CALC): 143 mg/dL — AB (ref <130)
TRIGLYCERIDES: 186 mg/dL — AB (ref <150)

## 2017-10-15 DIAGNOSIS — M4690 Unspecified inflammatory spondylopathy, site unspecified: Secondary | ICD-10-CM | POA: Diagnosis not present

## 2017-10-15 DIAGNOSIS — Z79899 Other long term (current) drug therapy: Secondary | ICD-10-CM | POA: Diagnosis not present

## 2017-10-15 DIAGNOSIS — M5030 Other cervical disc degeneration, unspecified cervical region: Secondary | ICD-10-CM | POA: Diagnosis not present

## 2017-12-17 DIAGNOSIS — Z79899 Other long term (current) drug therapy: Secondary | ICD-10-CM | POA: Diagnosis not present

## 2017-12-17 DIAGNOSIS — M47819 Spondylosis without myelopathy or radiculopathy, site unspecified: Secondary | ICD-10-CM | POA: Diagnosis not present

## 2017-12-17 DIAGNOSIS — M5030 Other cervical disc degeneration, unspecified cervical region: Secondary | ICD-10-CM | POA: Diagnosis not present

## 2018-01-06 ENCOUNTER — Other Ambulatory Visit: Payer: Self-pay | Admitting: Family Medicine

## 2018-01-06 DIAGNOSIS — M5441 Lumbago with sciatica, right side: Principal | ICD-10-CM

## 2018-01-06 DIAGNOSIS — M5442 Lumbago with sciatica, left side: Principal | ICD-10-CM

## 2018-01-06 DIAGNOSIS — J431 Panlobular emphysema: Secondary | ICD-10-CM

## 2018-01-06 DIAGNOSIS — G8929 Other chronic pain: Secondary | ICD-10-CM

## 2018-01-06 MED ORDER — VENLAFAXINE HCL ER 150 MG PO CP24: 150.0000 mg | ORAL_CAPSULE | Freq: Every day | ORAL | 11 refills | Status: DC

## 2018-01-06 NOTE — Telephone Encounter (Signed)
Pt needs refill on venlafaxine sent to [facility].  He had a refill but it expired last month.

## 2018-01-13 DIAGNOSIS — M47819 Spondylosis without myelopathy or radiculopathy, site unspecified: Secondary | ICD-10-CM | POA: Diagnosis not present

## 2018-01-13 DIAGNOSIS — Z79899 Other long term (current) drug therapy: Secondary | ICD-10-CM | POA: Diagnosis not present

## 2018-01-20 DIAGNOSIS — M47819 Spondylosis without myelopathy or radiculopathy, site unspecified: Secondary | ICD-10-CM | POA: Diagnosis not present

## 2018-01-20 DIAGNOSIS — Z79899 Other long term (current) drug therapy: Secondary | ICD-10-CM | POA: Diagnosis not present

## 2018-02-14 DIAGNOSIS — Z79899 Other long term (current) drug therapy: Secondary | ICD-10-CM | POA: Diagnosis not present

## 2018-02-14 DIAGNOSIS — M47819 Spondylosis without myelopathy or radiculopathy, site unspecified: Secondary | ICD-10-CM | POA: Diagnosis not present

## 2018-02-15 DIAGNOSIS — M5416 Radiculopathy, lumbar region: Secondary | ICD-10-CM | POA: Diagnosis not present

## 2018-02-22 DIAGNOSIS — Z135 Encounter for screening for eye and ear disorders: Secondary | ICD-10-CM | POA: Diagnosis not present

## 2018-02-22 DIAGNOSIS — M5126 Other intervertebral disc displacement, lumbar region: Secondary | ICD-10-CM | POA: Diagnosis not present

## 2018-02-22 DIAGNOSIS — M5416 Radiculopathy, lumbar region: Secondary | ICD-10-CM | POA: Diagnosis not present

## 2018-02-25 DIAGNOSIS — M1990 Unspecified osteoarthritis, unspecified site: Secondary | ICD-10-CM | POA: Diagnosis not present

## 2018-02-25 DIAGNOSIS — Z79899 Other long term (current) drug therapy: Secondary | ICD-10-CM | POA: Diagnosis not present

## 2018-02-25 DIAGNOSIS — M47819 Spondylosis without myelopathy or radiculopathy, site unspecified: Secondary | ICD-10-CM | POA: Diagnosis not present

## 2018-03-01 DIAGNOSIS — M5416 Radiculopathy, lumbar region: Secondary | ICD-10-CM | POA: Diagnosis not present

## 2018-03-04 DIAGNOSIS — M5416 Radiculopathy, lumbar region: Secondary | ICD-10-CM | POA: Diagnosis not present

## 2018-03-08 DIAGNOSIS — M5416 Radiculopathy, lumbar region: Secondary | ICD-10-CM | POA: Diagnosis not present

## 2018-03-16 DIAGNOSIS — Z79899 Other long term (current) drug therapy: Secondary | ICD-10-CM | POA: Diagnosis not present

## 2018-03-16 DIAGNOSIS — M5136 Other intervertebral disc degeneration, lumbar region: Secondary | ICD-10-CM | POA: Diagnosis not present

## 2018-03-16 DIAGNOSIS — M5126 Other intervertebral disc displacement, lumbar region: Secondary | ICD-10-CM | POA: Diagnosis not present

## 2018-03-18 DIAGNOSIS — M545 Low back pain: Secondary | ICD-10-CM | POA: Diagnosis not present

## 2018-03-18 DIAGNOSIS — Z79899 Other long term (current) drug therapy: Secondary | ICD-10-CM | POA: Diagnosis not present

## 2018-03-18 DIAGNOSIS — R030 Elevated blood-pressure reading, without diagnosis of hypertension: Secondary | ICD-10-CM | POA: Diagnosis not present

## 2018-03-22 DIAGNOSIS — M5416 Radiculopathy, lumbar region: Secondary | ICD-10-CM | POA: Diagnosis not present

## 2018-03-25 DIAGNOSIS — M1990 Unspecified osteoarthritis, unspecified site: Secondary | ICD-10-CM | POA: Diagnosis not present

## 2018-03-25 DIAGNOSIS — Z79899 Other long term (current) drug therapy: Secondary | ICD-10-CM | POA: Diagnosis not present

## 2018-03-25 DIAGNOSIS — M5126 Other intervertebral disc displacement, lumbar region: Secondary | ICD-10-CM | POA: Diagnosis not present

## 2018-04-01 ENCOUNTER — Other Ambulatory Visit: Payer: Self-pay | Admitting: Orthopedic Surgery

## 2018-04-04 DIAGNOSIS — M5416 Radiculopathy, lumbar region: Secondary | ICD-10-CM | POA: Diagnosis not present

## 2018-04-04 NOTE — Pre-Procedure Instructions (Signed)
Seth [name]  04/04/2018      [facility] [place], [place] AT [facility] [address] [place] [address] Phone: [phone] Fax: [phone]    Your procedure is scheduled on June 6  Report to [place] at 1230 P.M.  Call this number if you have problems the morning of surgery:  [phone]   Remember:  NOTHING TO EAT OR DRINK AFTER MIDNIGHT    Take these medicines the morning of surgery with A SIP OF WATER  traMADol (ULTRAM-ER) venlafaxine XR (EFFEXOR XR)   7 days prior to surgery STOP taking any Aspirin(unless otherwise instructed by your surgeon), Aleve, Naproxen, Ibuprofen, Motrin, Advil, Goody's, BC's, all herbal medications, fish oil, and all vitamins     Do not wear jewelry  Do not wear lotions, powders, or cologne, or deodorant.  Men may shave face and neck.  Do not bring valuables to the hospital.  [name] is not responsible for any belongings or valuables.  Contacts, dentures or bridgework may not be worn into surgery.  Leave your suitcase in the car.  After surgery it may be brought to your room.  For patients admitted to the hospital, discharge time will be determined by your treatment team.  Patients discharged the day of surgery will not be allowed to drive home.    Special instructions:   [name]- Preparing For Surgery  Before surgery, you can play an important role. Because skin is not sterile, your skin needs to be as free of germs as possible. You can reduce the number of germs on your skin by washing with CHG (chlorahexidine gluconate) Soap before surgery.  CHG is an antiseptic cleaner which kills germs and bonds with the skin to continue killing germs even after washing.    Oral Hygiene is also important to reduce your risk of infection.  Remember - BRUSH YOUR TEETH THE MORNING OF SURGERY WITH YOUR REGULAR TOOTHPASTE  Please do not use if you have an allergy  to CHG or antibacterial soaps. If your skin becomes reddened/irritated stop using the CHG.  Do not shave (including legs and underarms) for at least 48 hours prior to first CHG shower. It is OK to shave your face.  Please follow these instructions carefully.   1. Shower the NIGHT BEFORE SURGERY and the MORNING OF SURGERY with CHG.   2. If you chose to wash your hair, wash your hair first as usual with your normal shampoo.  3. After you shampoo, rinse your hair and body thoroughly to remove the shampoo.  4. Use CHG as you would any other liquid soap. You can apply CHG directly to the skin and wash gently with a scrungie or a clean washcloth.   5. Apply the CHG Soap to your body ONLY FROM THE NECK DOWN.  Do not use on open wounds or open sores. Avoid contact with your eyes, ears, mouth and genitals (private parts). Wash Face and genitals (private parts)  with your normal soap.  6. Wash thoroughly, paying special attention to the area where your surgery will be performed.  7. Thoroughly rinse your body with warm water from the neck down.  8. DO NOT shower/wash with your normal soap after using and rinsing off the CHG Soap.  9. Pat yourself dry with a CLEAN TOWEL.  10. Wear CLEAN PAJAMAS to bed the night before surgery, wear comfortable  clothes the morning of surgery  11. Place CLEAN SHEETS on your bed the night of your first shower and DO NOT SLEEP WITH PETS.    Day of Surgery:  Do not apply any deodorants/lotions.  Please wear clean clothes to the hospital/surgery center.   Remember to brush your teeth WITH YOUR REGULAR TOOTHPASTE.    Please read over the following fact sheets that you were given.

## 2018-04-05 ENCOUNTER — Ambulatory Visit (HOSPITAL_COMMUNITY)
Admission: RE | Admit: 2018-04-05 | Discharge: 2018-04-05 | Disposition: A | Discharge status: Home / Self Care | Payer: 59 | Source: Ambulatory Visit | Attending: Orthopedic Surgery | Admitting: Orthopedic Surgery

## 2018-04-05 ENCOUNTER — Other Ambulatory Visit: Payer: Self-pay

## 2018-04-05 ENCOUNTER — Encounter (HOSPITAL_COMMUNITY): Payer: Self-pay

## 2018-04-05 ENCOUNTER — Encounter (HOSPITAL_COMMUNITY)
Admission: RE | Admit: 2018-04-05 | Discharge: 2018-04-05 | Disposition: A | Discharge status: Home / Self Care | Payer: 59 | Source: Ambulatory Visit | Attending: Orthopedic Surgery | Admitting: Orthopedic Surgery

## 2018-04-05 DIAGNOSIS — Z01818 Encounter for other preprocedural examination: Secondary | ICD-10-CM | POA: Insufficient documentation

## 2018-04-05 DIAGNOSIS — Z01810 Encounter for preprocedural cardiovascular examination: Secondary | ICD-10-CM | POA: Diagnosis not present

## 2018-04-05 HISTORY — DX: Pain in leg, unspecified: M79.606

## 2018-04-05 HISTORY — DX: Gastro-esophageal reflux disease without esophagitis: K21.9

## 2018-04-05 HISTORY — DX: Cervicalgia: M54.2

## 2018-04-05 HISTORY — DX: Presence of dental prosthetic device (complete) (partial): Z97.2

## 2018-04-05 LAB — TYPE AND SCREEN
ABO/RH(D): AB POS
ANTIBODY SCREEN: NEGATIVE

## 2018-04-05 LAB — APTT: APTT: 29 s (ref 24–36)

## 2018-04-05 LAB — CBC WITH DIFFERENTIAL/PLATELET
ABS IMMATURE GRANULOCYTES: 0 10*3/uL (ref 0.0–0.1)
BASOS ABS: 0.1 10*3/uL (ref 0.0–0.1)
BASOS PCT: 1 %
Eosinophils Absolute: 0.3 10*3/uL (ref 0.0–0.7)
Eosinophils Relative: 4 %
HCT: 45.2 % (ref 39.0–52.0)
Hemoglobin: 15.3 g/dL (ref 13.0–17.0)
IMMATURE GRANULOCYTES: 0 %
Lymphocytes Relative: 28 %
Lymphs Abs: 2.5 10*3/uL (ref 0.7–4.0)
MCH: 29.3 pg (ref 26.0–34.0)
MCHC: 33.8 g/dL (ref 30.0–36.0)
MCV: 86.4 fL (ref 78.0–100.0)
Monocytes Absolute: 0.6 10*3/uL (ref 0.1–1.0)
Monocytes Relative: 7 %
NEUTROS PCT: 60 %
Neutro Abs: 5.3 10*3/uL (ref 1.7–7.7)
PLATELETS: 299 10*3/uL (ref 150–400)
RBC: 5.23 MIL/uL (ref 4.22–5.81)
RDW: 12.1 % (ref 11.5–15.5)
WBC: 8.9 10*3/uL (ref 4.0–10.5)

## 2018-04-05 LAB — URINALYSIS, ROUTINE W REFLEX MICROSCOPIC
BILIRUBIN URINE: NEGATIVE
GLUCOSE, UA: NEGATIVE mg/dL
HGB URINE DIPSTICK: NEGATIVE
KETONES UR: NEGATIVE mg/dL
LEUKOCYTES UA: NEGATIVE
Nitrite: NEGATIVE
PROTEIN: NEGATIVE mg/dL
Specific Gravity, Urine: 1.011 (ref 1.005–1.030)
pH: 6 (ref 5.0–8.0)

## 2018-04-05 LAB — COMPREHENSIVE METABOLIC PANEL
ALT: 21 U/L (ref 17–63)
AST: 17 U/L (ref 15–41)
Albumin: 4 g/dL (ref 3.5–5.0)
Alkaline Phosphatase: 54 U/L (ref 38–126)
Anion gap: 6 (ref 5–15)
BUN: 16 mg/dL (ref 6–20)
CHLORIDE: 104 mmol/L (ref 101–111)
CO2: 28 mmol/L (ref 22–32)
CREATININE: 1.23 mg/dL (ref 0.61–1.24)
Calcium: 9.5 mg/dL (ref 8.9–10.3)
GFR calc non Af Amer: >60 mL/min (ref >60)
Glucose, Bld: 131 mg/dL — ABNORMAL HIGH (ref 65–99)
POTASSIUM: 4 mmol/L (ref 3.5–5.1)
SODIUM: 138 mmol/L (ref 135–145)
Total Bilirubin: 0.7 mg/dL (ref 0.3–1.2)
Total Protein: 7.1 g/dL (ref 6.5–8.1)

## 2018-04-05 LAB — SURGICAL PCR SCREEN
MRSA, PCR: NEGATIVE
Staphylococcus aureus: POSITIVE — AB

## 2018-04-05 LAB — PROTIME-INR
INR: 0.96
Prothrombin Time: 12.7 seconds (ref 11.4–15.2)

## 2018-04-05 NOTE — Progress Notes (Signed)
Pt denies SOB, chest pain, and being under the care of a cardiologist. Pt denies having a cardiac cath and echo. Requested stress test from [facility]. Pt denies having an EKG and chest x ray within the last year. Pt denies recent labs.

## 2018-04-06 LAB — ABO/RH: ABO/RH(D): AB POS

## 2018-04-06 NOTE — Progress Notes (Signed)
[facility] about requested stress test states she will fax it over.

## 2018-04-07 ENCOUNTER — Ambulatory Visit (HOSPITAL_COMMUNITY): Payer: 59 | Admitting: Certified Registered Nurse Anesthetist

## 2018-04-07 ENCOUNTER — Other Ambulatory Visit: Payer: Self-pay

## 2018-04-07 ENCOUNTER — Ambulatory Visit (HOSPITAL_COMMUNITY): Payer: 59

## 2018-04-07 ENCOUNTER — Encounter (HOSPITAL_COMMUNITY)
Admission: RE | Disposition: A | Discharge status: Home / Self Care | Payer: Self-pay | Source: Ambulatory Visit | Attending: Orthopedic Surgery

## 2018-04-07 ENCOUNTER — Encounter (HOSPITAL_COMMUNITY): Payer: Self-pay | Admitting: *Deleted

## 2018-04-07 ENCOUNTER — Ambulatory Visit (HOSPITAL_COMMUNITY)
Admission: RE | Admit: 2018-04-07 | Discharge: 2018-04-07 | Disposition: A | Discharge status: Home / Self Care | Payer: 59 | Source: Ambulatory Visit | Attending: Orthopedic Surgery | Admitting: Orthopedic Surgery

## 2018-04-07 DIAGNOSIS — J449 Chronic obstructive pulmonary disease, unspecified: Secondary | ICD-10-CM | POA: Diagnosis not present

## 2018-04-07 DIAGNOSIS — Z79899 Other long term (current) drug therapy: Secondary | ICD-10-CM | POA: Insufficient documentation

## 2018-04-07 DIAGNOSIS — K219 Gastro-esophageal reflux disease without esophagitis: Secondary | ICD-10-CM | POA: Insufficient documentation

## 2018-04-07 DIAGNOSIS — M5116 Intervertebral disc disorders with radiculopathy, lumbar region: Secondary | ICD-10-CM | POA: Insufficient documentation

## 2018-04-07 DIAGNOSIS — Z419 Encounter for procedure for purposes other than remedying health state, unspecified: Secondary | ICD-10-CM

## 2018-04-07 DIAGNOSIS — F17210 Nicotine dependence, cigarettes, uncomplicated: Secondary | ICD-10-CM | POA: Diagnosis not present

## 2018-04-07 DIAGNOSIS — Z8249 Family history of ischemic heart disease and other diseases of the circulatory system: Secondary | ICD-10-CM | POA: Insufficient documentation

## 2018-04-07 DIAGNOSIS — Z981 Arthrodesis status: Secondary | ICD-10-CM | POA: Diagnosis not present

## 2018-04-07 DIAGNOSIS — E785 Hyperlipidemia, unspecified: Secondary | ICD-10-CM | POA: Diagnosis not present

## 2018-04-07 DIAGNOSIS — M5416 Radiculopathy, lumbar region: Secondary | ICD-10-CM | POA: Diagnosis not present

## 2018-04-07 DIAGNOSIS — M5186 Other intervertebral disc disorders, lumbar region: Secondary | ICD-10-CM | POA: Diagnosis not present

## 2018-04-07 HISTORY — PX: LUMBAR LAMINECTOMY/DECOMPRESSION MICRODISCECTOMY: SHX5026

## 2018-04-07 SURGERY — LUMBAR LAMINECTOMY/DECOMPRESSION MICRODISCECTOMY
Anesthesia: General

## 2018-04-07 MED ORDER — DEXAMETHASONE SODIUM PHOSPHATE 10 MG/ML IJ SOLN
INTRAMUSCULAR | Status: AC
  Filled 2018-04-07: qty 1

## 2018-04-07 MED ORDER — CEFAZOLIN SODIUM-DEXTROSE 2-4 GM/100ML-% IV SOLN
INTRAVENOUS | Status: AC
  Filled 2018-04-07: qty 100

## 2018-04-07 MED ORDER — PROMETHAZINE HCL 25 MG/ML IJ SOLN: 6.2500 mg | INTRAMUSCULAR | Status: DC | PRN

## 2018-04-07 MED ORDER — ONDANSETRON HCL 4 MG/2ML IJ SOLN
INTRAMUSCULAR | Status: DC | PRN
  Administered 2018-04-07: 4 mg via INTRAVENOUS

## 2018-04-07 MED ORDER — MIDAZOLAM HCL 2 MG/2ML IJ SOLN
INTRAMUSCULAR | Status: DC | PRN
  Administered 2018-04-07: 2 mg via INTRAVENOUS

## 2018-04-07 MED ORDER — BUPIVACAINE HCL 0.25 % IJ SOLN
INTRAMUSCULAR | Status: DC | PRN
  Administered 2018-04-07: 10 mL
  Administered 2018-04-07: 5 mL

## 2018-04-07 MED ORDER — BUPIVACAINE-EPINEPHRINE (PF) 0.25% -1:200000 IJ SOLN
INTRAMUSCULAR | Status: AC
  Filled 2018-04-07: qty 30

## 2018-04-07 MED ORDER — ROCURONIUM BROMIDE 10 MG/ML (PF) SYRINGE
PREFILLED_SYRINGE | INTRAVENOUS | Status: DC | PRN
  Administered 2018-04-07: 20 mg via INTRAVENOUS
  Administered 2018-04-07: 10 mg via INTRAVENOUS
  Administered 2018-04-07: 70 mg via INTRAVENOUS

## 2018-04-07 MED ORDER — BUPIVACAINE LIPOSOME 1.3 % IJ SUSP
20.0000 mL | INTRAMUSCULAR | Status: AC
  Administered 2018-04-07: 10 mL
  Filled 2018-04-07: qty 20

## 2018-04-07 MED ORDER — SODIUM CHLORIDE 0.9 % IJ SOLN
INTRAMUSCULAR | Status: AC
  Filled 2018-04-07: qty 20

## 2018-04-07 MED ORDER — HEMOSTATIC AGENTS (NO CHARGE) OPTIME
TOPICAL | Status: DC | PRN
  Administered 2018-04-07: 1 via TOPICAL

## 2018-04-07 MED ORDER — ONDANSETRON HCL 4 MG/2ML IJ SOLN
INTRAMUSCULAR | Status: AC
  Filled 2018-04-07: qty 2

## 2018-04-07 MED ORDER — PHENYLEPHRINE 40 MCG/ML (10ML) SYRINGE FOR IV PUSH (FOR BLOOD PRESSURE SUPPORT)
PREFILLED_SYRINGE | INTRAVENOUS | Status: AC
  Filled 2018-04-07: qty 10

## 2018-04-07 MED ORDER — THROMBIN 20000 UNITS EX SOLR
CUTANEOUS | Status: DC | PRN
  Administered 2018-04-07: 20000 [IU] via TOPICAL

## 2018-04-07 MED ORDER — LACTATED RINGERS IV SOLN
INTRAVENOUS | Status: DC
  Administered 2018-04-07 (×2): via INTRAVENOUS

## 2018-04-07 MED ORDER — ROCURONIUM BROMIDE 10 MG/ML (PF) SYRINGE
PREFILLED_SYRINGE | INTRAVENOUS | Status: AC
  Filled 2018-04-07: qty 30

## 2018-04-07 MED ORDER — METHYLPREDNISOLONE ACETATE 40 MG/ML IJ SUSP
INTRAMUSCULAR | Status: DC | PRN
  Administered 2018-04-07: 40 mg

## 2018-04-07 MED ORDER — PROPOFOL 10 MG/ML IV BOLUS
INTRAVENOUS | Status: AC
  Filled 2018-04-07: qty 20

## 2018-04-07 MED ORDER — PHENYLEPHRINE 40 MCG/ML (10ML) SYRINGE FOR IV PUSH (FOR BLOOD PRESSURE SUPPORT)
PREFILLED_SYRINGE | INTRAVENOUS | Status: AC
  Filled 2018-04-07: qty 20

## 2018-04-07 MED ORDER — LIDOCAINE 2% (20 MG/ML) 5 ML SYRINGE
INTRAMUSCULAR | Status: AC
  Filled 2018-04-07: qty 15

## 2018-04-07 MED ORDER — HYDROMORPHONE HCL 2 MG/ML IJ SOLN
INTRAMUSCULAR | Status: AC
  Filled 2018-04-07: qty 1

## 2018-04-07 MED ORDER — CEFAZOLIN SODIUM-DEXTROSE 2-4 GM/100ML-% IV SOLN
2.0000 g | INTRAVENOUS | Status: AC
  Administered 2018-04-07: 2 g via INTRAVENOUS

## 2018-04-07 MED ORDER — LIDOCAINE 2% (20 MG/ML) 5 ML SYRINGE
INTRAMUSCULAR | Status: DC | PRN
  Administered 2018-04-07: 100 mg via INTRAVENOUS

## 2018-04-07 MED ORDER — THROMBIN 20000 UNITS EX SOLR
CUTANEOUS | Status: AC
  Filled 2018-04-07: qty 20000

## 2018-04-07 MED ORDER — ARTIFICIAL TEARS OPHTHALMIC OINT
TOPICAL_OINTMENT | OPHTHALMIC | Status: DC | PRN
  Administered 2018-04-07: 1 via OPHTHALMIC

## 2018-04-07 MED ORDER — FENTANYL CITRATE (PF) 250 MCG/5ML IJ SOLN
INTRAMUSCULAR | Status: AC
  Filled 2018-04-07: qty 5

## 2018-04-07 MED ORDER — ARTIFICIAL TEARS OPHTHALMIC OINT
TOPICAL_OINTMENT | OPHTHALMIC | Status: AC
  Filled 2018-04-07: qty 7

## 2018-04-07 MED ORDER — METHYLENE BLUE 0.5 % INJ SOLN
INTRAVENOUS | Status: DC | PRN
  Administered 2018-04-07: .2 mL

## 2018-04-07 MED ORDER — SUGAMMADEX SODIUM 200 MG/2ML IV SOLN
INTRAVENOUS | Status: AC
  Filled 2018-04-07: qty 2

## 2018-04-07 MED ORDER — METHYLENE BLUE 0.5 % INJ SOLN
INTRAVENOUS | Status: AC
  Filled 2018-04-07: qty 10

## 2018-04-07 MED ORDER — SUGAMMADEX SODIUM 200 MG/2ML IV SOLN
INTRAVENOUS | Status: DC | PRN
Start: 2018-04-07 — End: 2018-04-07
  Administered 2018-04-07: 200 mg via INTRAVENOUS

## 2018-04-07 MED ORDER — DEXAMETHASONE SODIUM PHOSPHATE 10 MG/ML IJ SOLN
INTRAMUSCULAR | Status: DC | PRN
  Administered 2018-04-07: 10 mg via INTRAVENOUS

## 2018-04-07 MED ORDER — PROPOFOL 10 MG/ML IV BOLUS
INTRAVENOUS | Status: DC | PRN
  Administered 2018-04-07: 200 mg via INTRAVENOUS

## 2018-04-07 MED ORDER — METHYLPREDNISOLONE ACETATE 40 MG/ML IJ SUSP
INTRAMUSCULAR | Status: AC
  Filled 2018-04-07: qty 1

## 2018-04-07 MED ORDER — ROCURONIUM BROMIDE 10 MG/ML (PF) SYRINGE
PREFILLED_SYRINGE | INTRAVENOUS | Status: AC
  Filled 2018-04-07: qty 10

## 2018-04-07 MED ORDER — BUPIVACAINE HCL (PF) 0.25 % IJ SOLN
INTRAMUSCULAR | Status: AC
  Filled 2018-04-07: qty 30

## 2018-04-07 MED ORDER — MIDAZOLAM HCL 2 MG/2ML IJ SOLN
INTRAMUSCULAR | Status: AC
  Filled 2018-04-07: qty 2

## 2018-04-07 MED ORDER — FENTANYL CITRATE (PF) 250 MCG/5ML IJ SOLN
INTRAMUSCULAR | Status: DC | PRN
  Administered 2018-04-07 (×2): 50 ug via INTRAVENOUS
  Administered 2018-04-07: 100 ug via INTRAVENOUS
  Administered 2018-04-07: 50 ug via INTRAVENOUS

## 2018-04-07 MED ORDER — EPHEDRINE SULFATE 50 MG/ML IJ SOLN
INTRAMUSCULAR | Status: AC
  Filled 2018-04-07: qty 2

## 2018-04-07 MED ORDER — HYDROMORPHONE HCL 2 MG/ML IJ SOLN
0.3000 mg | INTRAMUSCULAR | Status: DC | PRN
  Administered 2018-04-07: 0.5 mg via INTRAVENOUS

## 2018-04-07 MED ORDER — POVIDONE-IODINE 7.5 % EX SOLN
Freq: Once | CUTANEOUS | Status: DC
  Filled 2018-04-07: qty 118

## 2018-04-07 MED ORDER — PHENYLEPHRINE 40 MCG/ML (10ML) SYRINGE FOR IV PUSH (FOR BLOOD PRESSURE SUPPORT)
PREFILLED_SYRINGE | INTRAVENOUS | Status: DC | PRN
  Administered 2018-04-07: 80 ug via INTRAVENOUS

## 2018-04-07 SURGICAL SUPPLY — 68 items
BENZOIN TINCTURE PRP APPL 2/3 (GAUZE/BANDAGES/DRESSINGS) ×2 IMPLANT
BUR ROUND PRECISION 4.0 (BURR) ×2 IMPLANT
CANISTER SUCT 3000ML PPV (MISCELLANEOUS) ×2 IMPLANT
CARTRIDGE OIL MAESTRO DRILL (MISCELLANEOUS) ×1 IMPLANT
CORDS BIPOLAR (ELECTRODE) ×2 IMPLANT
COVER SURGICAL LIGHT HANDLE (MISCELLANEOUS) IMPLANT
DIFFUSER DRILL AIR PNEUMATIC (MISCELLANEOUS) ×2 IMPLANT
DRAIN CHANNEL 15F RND FF W/TCR (WOUND CARE) IMPLANT
DRAPE POUCH INSTRU U-SHP 10X18 (DRAPES) ×4 IMPLANT
DRAPE SURG 17X23 STRL (DRAPES) ×8 IMPLANT
DURAPREP 26ML APPLICATOR (WOUND CARE) ×2 IMPLANT
ELECT BLADE 4.0 EZ CLEAN MEGAD (MISCELLANEOUS) ×2
ELECT CAUTERY BLADE 6.4 (BLADE) ×2 IMPLANT
ELECT REM PT RETURN 9FT ADLT (ELECTROSURGICAL) ×2
ELECTRODE BLDE 4.0 EZ CLN MEGD (MISCELLANEOUS) ×1 IMPLANT
ELECTRODE REM PT RTRN 9FT ADLT (ELECTROSURGICAL) ×1 IMPLANT
EVACUATOR SILICONE 100CC (DRAIN) IMPLANT
FILTER STRAW FLUID ASPIR (MISCELLANEOUS) ×2 IMPLANT
GAUZE SPONGE 4X4 12PLY STRL (GAUZE/BANDAGES/DRESSINGS) ×2 IMPLANT
GAUZE SPONGE 4X4 16PLY XRAY LF (GAUZE/BANDAGES/DRESSINGS) ×4 IMPLANT
GLOVE BIO SURGEON STRL SZ7 (GLOVE) ×2 IMPLANT
GLOVE BIO SURGEON STRL SZ8 (GLOVE) ×2 IMPLANT
GLOVE BIOGEL PI IND STRL 7.0 (GLOVE) ×1 IMPLANT
GLOVE BIOGEL PI IND STRL 8 (GLOVE) ×1 IMPLANT
GLOVE BIOGEL PI INDICATOR 7.0 (GLOVE) ×1
GLOVE BIOGEL PI INDICATOR 8 (GLOVE) ×1
GOWN STRL REUS W/ TWL LRG LVL3 (GOWN DISPOSABLE) ×1 IMPLANT
GOWN STRL REUS W/ TWL XL LVL3 (GOWN DISPOSABLE) ×2 IMPLANT
GOWN STRL REUS W/TWL LRG LVL3 (GOWN DISPOSABLE) ×1
GOWN STRL REUS W/TWL XL LVL3 (GOWN DISPOSABLE) ×2
IV CATH 14GX2 1/4 (CATHETERS) ×2 IMPLANT
KIT BASIN OR (CUSTOM PROCEDURE TRAY) ×2 IMPLANT
KIT POSITION SURG JACKSON T1 (MISCELLANEOUS) ×2 IMPLANT
KIT TURNOVER KIT B (KITS) ×2 IMPLANT
NEEDLE 18GX1X1/2 (RX/OR ONLY) (NEEDLE) ×2 IMPLANT
NEEDLE 22X1 1/2 (OR ONLY) (NEEDLE) ×2 IMPLANT
NEEDLE HYPO 25GX1X1/2 BEV (NEEDLE) ×2 IMPLANT
NEEDLE SPNL 18GX3.5 QUINCKE PK (NEEDLE) ×4 IMPLANT
NS IRRIG 1000ML POUR BTL (IV SOLUTION) ×2 IMPLANT
OIL CARTRIDGE MAESTRO DRILL (MISCELLANEOUS) ×2
PACK LAMINECTOMY ORTHO (CUSTOM PROCEDURE TRAY) ×2 IMPLANT
PACK UNIVERSAL I (CUSTOM PROCEDURE TRAY) ×2 IMPLANT
PAD ARMBOARD 7.5X6 YLW CONV (MISCELLANEOUS) ×4 IMPLANT
PATTIES SURGICAL .5 X.5 (GAUZE/BANDAGES/DRESSINGS) IMPLANT
PATTIES SURGICAL .5 X1 (DISPOSABLE) ×2 IMPLANT
SPONGE INTESTINAL PEANUT (DISPOSABLE) ×2 IMPLANT
SPONGE SURGIFOAM ABS GEL 100 (HEMOSTASIS) IMPLANT
SPONGE SURGIFOAM ABS GEL SZ50 (HEMOSTASIS) ×2 IMPLANT
STRIP CLOSURE SKIN 1/2X4 (GAUZE/BANDAGES/DRESSINGS) ×2 IMPLANT
SURGIFLO W/THROMBIN 8M KIT (HEMOSTASIS) IMPLANT
SUT MNCRL AB 4-0 PS2 18 (SUTURE) ×2 IMPLANT
SUT VIC AB 0 CT1 18XCR BRD 8 (SUTURE) IMPLANT
SUT VIC AB 0 CT1 27 (SUTURE)
SUT VIC AB 0 CT1 27XBRD ANBCTR (SUTURE) IMPLANT
SUT VIC AB 0 CT1 8-18 (SUTURE)
SUT VIC AB 1 CT1 18XCR BRD 8 (SUTURE) ×1 IMPLANT
SUT VIC AB 1 CT1 8-18 (SUTURE) ×1
SUT VIC AB 2-0 CT2 18 VCP726D (SUTURE) ×2 IMPLANT
SYR 20CC LL (SYRINGE) ×2 IMPLANT
SYR BULB IRRIGATION 50ML (SYRINGE) ×2 IMPLANT
SYR CONTROL 10ML LL (SYRINGE) ×4 IMPLANT
SYR TB 1ML 26GX3/8 SAFETY (SYRINGE) ×4 IMPLANT
SYR TB 1ML LUER SLIP (SYRINGE) ×4 IMPLANT
TAPE CLOTH SURG 6X10 WHT LF (GAUZE/BANDAGES/DRESSINGS) ×2 IMPLANT
TOWEL OR 17X24 6PK STRL BLUE (TOWEL DISPOSABLE) ×2 IMPLANT
TOWEL OR 17X26 10 PK STRL BLUE (TOWEL DISPOSABLE) ×2 IMPLANT
WATER STERILE IRR 1000ML POUR (IV SOLUTION) ×2 IMPLANT
YANKAUER SUCT BULB TIP NO VENT (SUCTIONS) ×2 IMPLANT

## 2018-04-07 NOTE — Anesthesia Procedure Notes (Signed)
Procedure Name: Intubation Date/Time: 04/07/2018 2:08 PM Performed by: Bryson Corona, CRNA Pre-anesthesia Checklist: Patient identified, Emergency Drugs available, Suction available and Patient being monitored Patient Re-evaluated:Patient Re-evaluated prior to induction Oxygen Delivery Method: Circle System Utilized Preoxygenation: Pre-oxygenation with 100% oxygen Induction Type: IV induction Ventilation: Oral airway inserted - appropriate to patient size Laryngoscope Size: Mac and 4 Grade View: Grade I Tube type: Oral Tube size: 7.0 mm Number of attempts: 1 Airway Equipment and Method: Stylet and Oral airway Placement Confirmation: ETT inserted through vocal cords under direct vision,  positive ETCO2 and breath sounds checked- equal and bilateral Secured at: 22 cm Tube secured with: Tape Dental Injury: Teeth and Oropharynx as per pre-operative assessment

## 2018-04-07 NOTE — Anesthesia Preprocedure Evaluation (Addendum)
Anesthesia Evaluation  Patient identified by MRN, date of birth, ID band Patient awake    Reviewed: Allergy & Precautions, NPO status , Patient's Chart, lab work & pertinent test results  Airway Mallampati: II  TM Distance: >3 FB Neck ROM: Full    Dental no notable dental hx. (+) Dental Advisory Given, Missing   Pulmonary COPD, Current Smoker,    Pulmonary exam normal        Cardiovascular negative cardio ROS Normal cardiovascular exam     Neuro/Psych negative psych ROS   GI/Hepatic Neg liver ROS, GERD  Medicated,  Endo/Other  negative endocrine ROS  Renal/GU negative Renal ROS  negative genitourinary   Musculoskeletal negative musculoskeletal ROS (+)   Abdominal   Peds negative pediatric ROS (+)  Hematology negative hematology ROS (+)   Anesthesia Other Findings   Reproductive/Obstetrics negative OB ROS                            Anesthesia Physical Anesthesia Plan  ASA: III  Anesthesia Plan: General   Post-op Pain Management:    Induction: Intravenous  PONV Risk Score and Plan: 2 and Ondansetron and Dexamethasone  Airway Management Planned: Oral ETT  Additional Equipment:   Intra-op Plan:   Post-operative Plan: Extubation in OR  Informed Consent: I have reviewed the patients History and Physical, chart, labs and discussed the procedure including the risks, benefits and alternatives for the proposed anesthesia with the patient or authorized representative who has indicated his/her understanding and acceptance.   Dental advisory given  Plan Discussed with: CRNA and Anesthesiologist  Anesthesia Plan Comments:         Anesthesia Quick Evaluation

## 2018-04-07 NOTE — Op Note (Signed)
NAME:   Seth [name]         MEDICAL RECORD NO.:   [id number]  PHYSICIAN:  Phylliss Bob, MD      DATE OF BIRTH:  09/07/1961  DATE OF PROCEDURE:   04/07/2018                              OPERATIVE REPORT   PREOPERATIVE DIAGNOSES: 1. Right-sided L5 radiculopathy. 2. Right-sided L4/5 disk herniation causing severe     compression of the right L5 nerve.  POSTOPERATIVE DIAGNOSES: 1. Right-sided L5 radiculopathy. 2. Right-sided L4/5 disk herniation causing severe     compression of the right L5 nerve.  PROCEDURES:  Right-sided L4/5 laminotomy with partial facetectomy and removal of large herniated right-sided L4/5 disk fragment.  SURGEON:  Phylliss Bob, MD.  ASSISTANTPricilla Holm, PA-C.  ANESTHESIA:  General endotracheal anesthesia.  COMPLICATIONS:  None.  DISPOSITION:  Stable.  ESTIMATED BLOOD LOSS:  Minimal.  INDICATIONS FOR SURGERY:  Briefly, [name] is a pleasant 57 year old patient, who did present to me with severe pain in the right leg.  The patient's MRI did reveal the findings outlined above, clearly notable for a herniated disk fragment. The pain was rather severe.  We did discuss treatment options and we did ultimately elect to proceed with the procedure reflected above.  The patient was fully made aware of the risks of surgery, including the risk of recurrent herniation and the need for subsequent surgery, including the possibility of a subsequent diskectomy and/or fusion.  OPERATIVE DETAILS:  On 04/07/2018, the patient was brought to surgery and general endotracheal anesthesia was administered.  The patient was placed prone on a well-padded flat Jackson bed with a spinal frame.  Antibiotics were given.  The back was prepped and draped and a time-out procedure was performed.  At this point, a midline incision was made directly over the L4/5 intervertebral space.  A curvilinear incision was made just to the right of the midline into the  fascia.  A self-retaining McCulloch retractor was placed.  The lamina of L4 and L5 was identified and subperiosteally exposed.  I then removed the lateral aspect of the L4/5 ligamentum flavum.  Readily identified was the traversing right L5 nerve, which was noted to be under obvious tension, and was noted to be rather erythematous.  I was able to gently gain medial retraction of the nerve, and in doing so, a large herniated disk fragment was readily noted.  This was removed in one very large fragment.  This did entirely alleviate the compression on the traversing right L5 nerve.  I was very pleased with the final decompression that I was able to accomplish.  At this point, the wound was copiously irrigated with normal saline.  All epidural bleeding was controlled using bipolar electrocautery in addition to FloSeal. All bleeding was controlled at the termination of the procedure.  At this point, 20 mg of Depo-Medrol was introduced about the epidural space in the region of the right S1 nerve.  The wound was then closed in layers using #1 Vicryl followed by 0 Vicryl, followed by 4-0 Monocryl. Benzoin and Steri-Strips were applied followed by a sterile dressing. All instrument counts were correct at the termination of the procedure.  Of note, Pricilla Holm was my assistant throughout surgery, and did aid in retraction, suctioning, and closure from start to finish.     Phylliss Bob, MD

## 2018-04-07 NOTE — Transfer of Care (Signed)
Immediate Anesthesia Transfer of Care Note  Patient: Seth [name]  Procedure(s) Performed: RIGHT - SIDED LUMBAR FOUR- FIVE MICRODISECTOMY. (N/A )  Patient Location: PACU  Anesthesia Type:General  Level of Consciousness: awake, alert  and oriented  Airway & Oxygen Therapy: Patient Spontanous Breathing and Patient connected to nasal cannula oxygen  Post-op Assessment: Report given to RN and Post -op Vital signs reviewed and stable  Post vital signs: Reviewed and stable  Last Vitals:  Vitals Value Taken Time  BP 147/88 04/07/2018  4:07 PM  Temp    Pulse 92 04/07/2018  4:09 PM  Resp 10 04/07/2018  4:09 PM  SpO2 100 % 04/07/2018  4:09 PM  Vitals shown include unvalidated device data.  Last Pain:  Vitals:   04/07/18 1131  TempSrc:   PainSc: 5       Patients Stated Pain Goal: 3 (09/81/19 1478)  Complications: No apparent anesthesia complications

## 2018-04-07 NOTE — H&P (Signed)
PREOPERATIVE H&P  Chief Complaint: Right leg pain  HPI: Seth [name] is a 57 y.o. male who presents with ongoing pain in the right leg  MRI reveals a right HNP at L4/5  Patient has failed multiple forms of conservative care and continues to have pain (see office notes for additional details regarding the patient's full course of treatment)  Past Medical History:  Diagnosis Date  . Degenerative disc disease, cervical   . Degenerative disc disease, lumbar   . GERD (gastroesophageal reflux disease)   . Leg pain   . Neck pain   . Wears partial dentures    Past Surgical History:  Procedure Laterality Date  . BLADDER SURGERY  2002   removed scar tissue  . CHOLECYSTECTOMY    . COLONOSCOPY    . MULTIPLE TOOTH EXTRACTIONS     Social History   Socioeconomic History  . Marital status: Married    Spouse name: Not on file  . Number of children: Not on file  . Years of education: Not on file  . Highest education level: Not on file  Occupational History  . Not on file  Social Needs  . Financial resource strain: Not on file  . Food insecurity:    Worry: Not on file    Inability: Not on file  . Transportation needs:    Medical: Not on file    Non-medical: Not on file  Tobacco Use  . Smoking status: Current Every Day Smoker    Packs/day: 1.00    Years: 15.00    Pack years: 15.00    Types: Cigarettes  . Smokeless tobacco: Never Used  Substance and Sexual Activity  . Alcohol use: Yes    Comment: occasionally  . Drug use: No  . Sexual activity: Yes  Lifestyle  . Physical activity:    Days per week: Not on file    Minutes per session: Not on file  . Stress: Not on file  Relationships  . Social connections:    Talks on phone: Not on file    Gets together: Not on file    Attends religious service: Not on file    Active member of club or organization: Not on file    Attends meetings of clubs or organizations: Not on file    Relationship status: Not on file    Other Topics Concern  . Not on file  Social History Narrative  . Not on file   Family History  Problem Relation Age of Onset  . Diabetes Mother   . Cancer Mother        bone  . Cancer Father   . Heart disease Father    No Known Allergies Prior to Admission medications   Medication Sig Start Date End Date Taking? Authorizing Provider  clonazePAM (KLONOPIN) 0.5 MG tablet Take 0.5 mg by mouth at bedtime as needed for sleep. 03/25/18  Yes [provider]  traMADol (ULTRAM) 50 MG tablet Take 50 mg by mouth 2 (two) times daily as needed (breakthrough pain).   Yes [provider]  traMADol (ULTRAM-ER) 200 MG 24 hr tablet Take 200 mg by mouth daily.   Yes [provider]  venlafaxine XR (EFFEXOR XR) 150 MG 24 hr capsule Take 1 capsule (150 mg total) by mouth daily with breakfast. 01/06/18  Yes Karamalegos, Devonne Doughty, DO     All other systems have been reviewed and were otherwise negative with the exception of those mentioned in the HPI  and as above.  Physical Exam: There were no vitals filed for this visit.  There is no height or weight on file to calculate BMI.  General: Alert, no acute distress Cardiovascular: No pedal edema Respiratory: No cyanosis, no use of accessory musculature Skin: No lesions in the area of chief complaint Neurologic: Sensation intact distally Psychiatric: Patient is competent for consent with normal mood and affect Lymphatic: No axillary or cervical lymphadenopathy  MUSCULOSKELETAL: + SLR on the right  Assessment/Plan: RIGHT LEG PAIN Plan for Procedure(s): RIGHT - SIDED LUMBAR MICRODISECTOMY, L4/5   Sinclair Ship, MD 04/07/2018 6:40 AM

## 2018-04-07 NOTE — Anesthesia Postprocedure Evaluation (Signed)
Anesthesia Post Note  Patient: Seth [name]  Procedure(s) Performed: RIGHT - SIDED LUMBAR FOUR- FIVE MICRODISECTOMY. (N/A )     Patient location during evaluation: PACU Anesthesia Type: General Level of consciousness: awake and alert Pain management: pain level controlled Vital Signs Assessment: post-procedure vital signs reviewed and stable Respiratory status: spontaneous breathing, nonlabored ventilation, respiratory function stable and patient connected to nasal cannula oxygen Cardiovascular status: blood pressure returned to baseline and stable Postop Assessment: no apparent nausea or vomiting Anesthetic complications: no    Last Vitals:  Vitals:   04/07/18 1653 04/07/18 1705  BP: (!) 139/94 (!) 143/96  Pulse: 96 94  Resp: 15   Temp: 36.7 C   SpO2: 96% 96%    Last Pain:  Vitals:   04/07/18 1705  TempSrc:   PainSc: 3                  [name]

## 2018-04-08 ENCOUNTER — Encounter (HOSPITAL_COMMUNITY): Payer: Self-pay | Admitting: Orthopedic Surgery

## 2018-04-22 DIAGNOSIS — Z79899 Other long term (current) drug therapy: Secondary | ICD-10-CM | POA: Diagnosis not present

## 2018-04-22 DIAGNOSIS — M5136 Other intervertebral disc degeneration, lumbar region: Secondary | ICD-10-CM | POA: Diagnosis not present

## 2018-05-18 DIAGNOSIS — M5136 Other intervertebral disc degeneration, lumbar region: Secondary | ICD-10-CM | POA: Diagnosis not present

## 2018-05-18 DIAGNOSIS — G894 Chronic pain syndrome: Secondary | ICD-10-CM | POA: Diagnosis not present

## 2018-05-18 DIAGNOSIS — Z79899 Other long term (current) drug therapy: Secondary | ICD-10-CM | POA: Diagnosis not present

## 2018-06-27 DIAGNOSIS — G894 Chronic pain syndrome: Secondary | ICD-10-CM | POA: Diagnosis not present

## 2018-06-27 DIAGNOSIS — M5136 Other intervertebral disc degeneration, lumbar region: Secondary | ICD-10-CM | POA: Diagnosis not present

## 2018-06-27 DIAGNOSIS — Z79899 Other long term (current) drug therapy: Secondary | ICD-10-CM | POA: Diagnosis not present

## 2018-06-27 DIAGNOSIS — M5030 Other cervical disc degeneration, unspecified cervical region: Secondary | ICD-10-CM | POA: Diagnosis not present

## 2018-07-28 DIAGNOSIS — M5030 Other cervical disc degeneration, unspecified cervical region: Secondary | ICD-10-CM | POA: Diagnosis not present

## 2018-07-28 DIAGNOSIS — Z79899 Other long term (current) drug therapy: Secondary | ICD-10-CM | POA: Diagnosis not present

## 2018-07-28 DIAGNOSIS — G894 Chronic pain syndrome: Secondary | ICD-10-CM | POA: Diagnosis not present

## 2018-08-15 IMAGING — CR DG CHEST 2V
2 series · 2 of 2 positions shown · non-contrast
Comparison: Radiographs December 29, 2012.

CLINICAL DATA: Preop for lumbar surgery.

EXAM:
CHEST - 2 VIEW

[w chest pa]
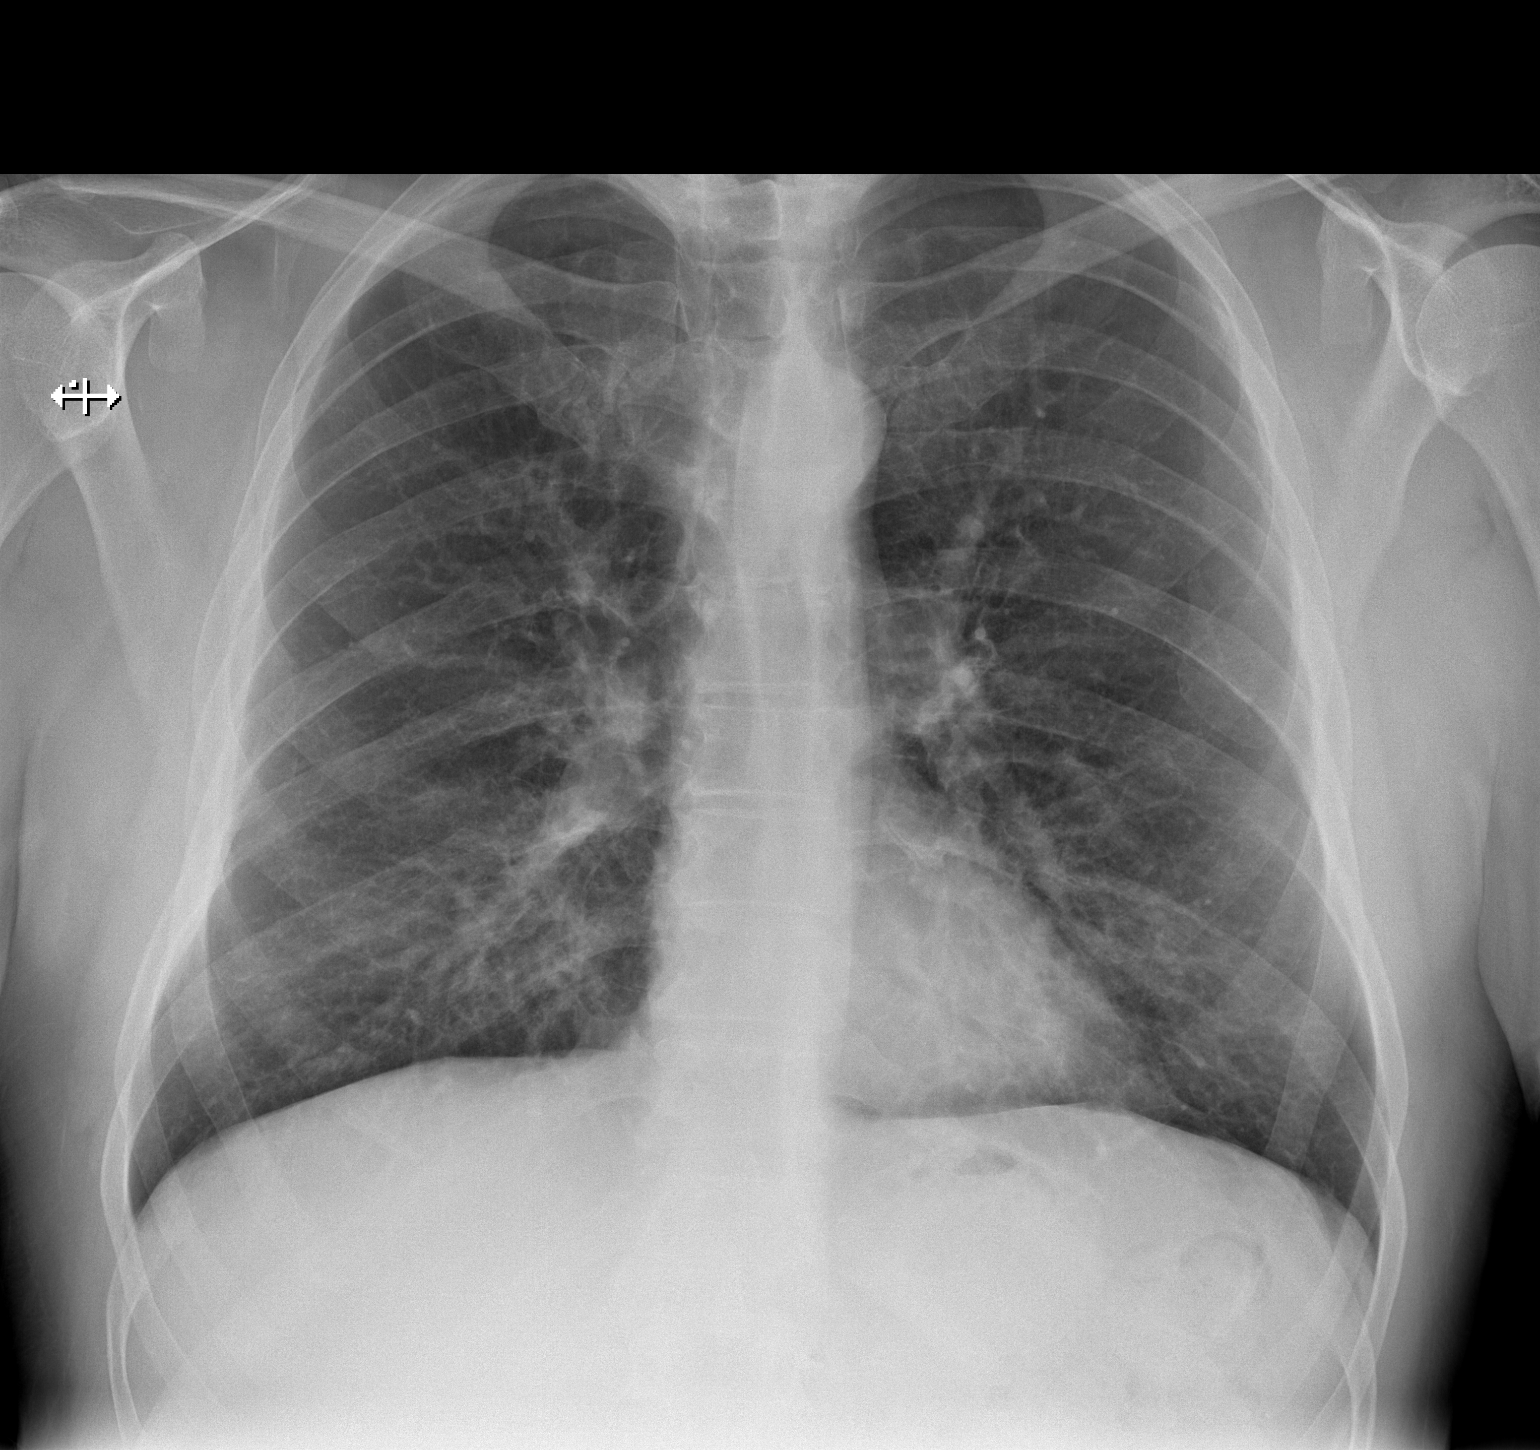

[w chest lat]
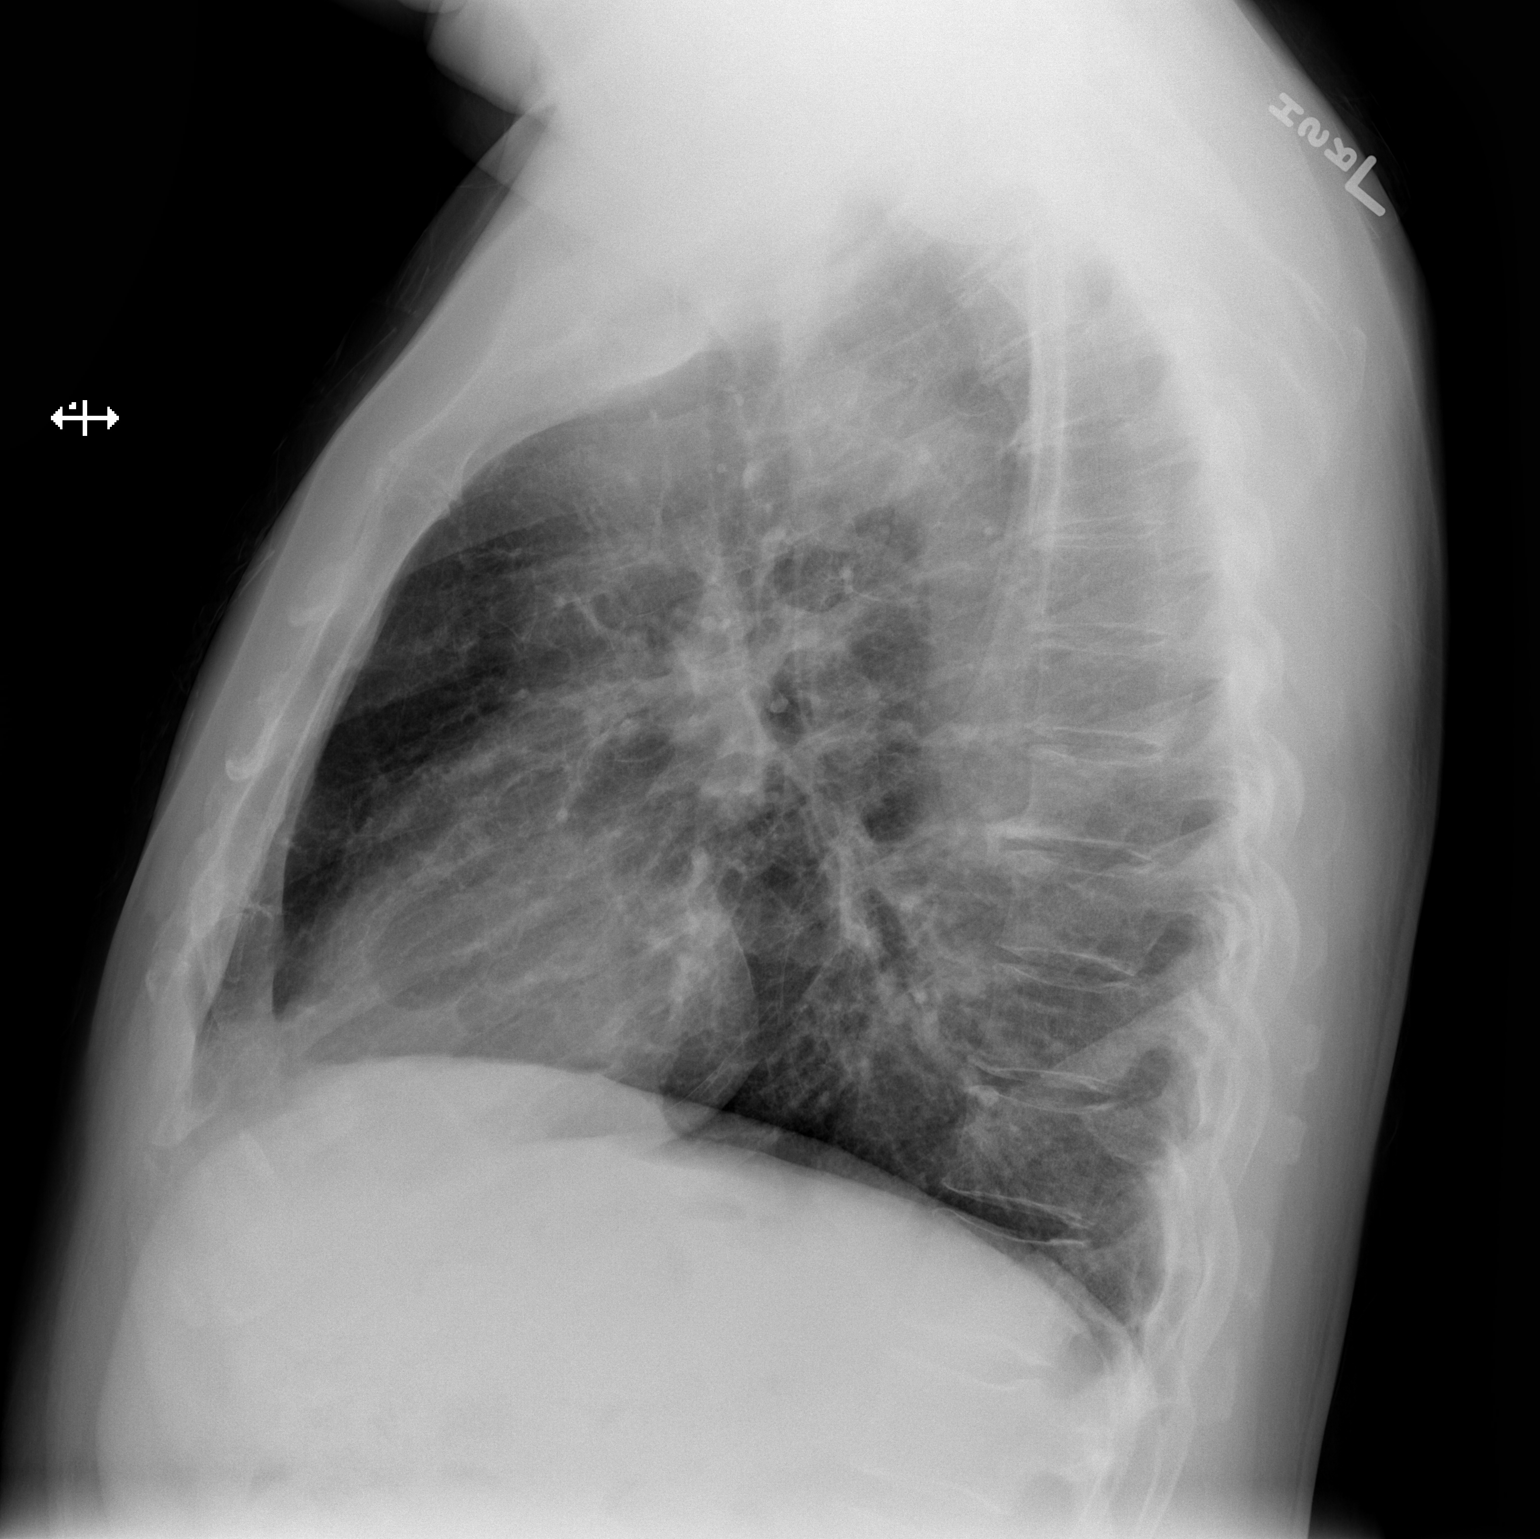

[2 of 2 positions shown; findings below may reference images not displayed]

FINDINGS: The heart size and mediastinal contours are within normal limits.
Both lungs are clear. No pneumothorax or pleural effusion is noted.
The visualized skeletal structures are unremarkable.
IMPRESSION: No active cardiopulmonary disease.

## 2018-08-26 DIAGNOSIS — M5136 Other intervertebral disc degeneration, lumbar region: Secondary | ICD-10-CM | POA: Diagnosis not present

## 2018-08-26 DIAGNOSIS — G894 Chronic pain syndrome: Secondary | ICD-10-CM | POA: Diagnosis not present

## 2018-08-26 DIAGNOSIS — Z79899 Other long term (current) drug therapy: Secondary | ICD-10-CM | POA: Diagnosis not present

## 2018-08-31 ENCOUNTER — Telehealth: Payer: Self-pay | Admitting: *Deleted

## 2018-08-31 ENCOUNTER — Ambulatory Visit (INDEPENDENT_AMBULATORY_CARE_PROVIDER_SITE_OTHER): Payer: 59 | Admitting: Family Medicine

## 2018-08-31 ENCOUNTER — Encounter: Payer: Self-pay | Admitting: Family Medicine

## 2018-08-31 VITALS — BP 130/84 | HR 84 | Temp 97.9°F | Resp 16 | Ht 74.0 in | Wt 221.6 lb

## 2018-08-31 DIAGNOSIS — M150 Primary generalized (osteo)arthritis: Secondary | ICD-10-CM

## 2018-08-31 DIAGNOSIS — M5136 Other intervertebral disc degeneration, lumbar region: Secondary | ICD-10-CM

## 2018-08-31 DIAGNOSIS — Z125 Encounter for screening for malignant neoplasm of prostate: Secondary | ICD-10-CM

## 2018-08-31 DIAGNOSIS — R918 Other nonspecific abnormal finding of lung field: Secondary | ICD-10-CM

## 2018-08-31 DIAGNOSIS — J431 Panlobular emphysema: Secondary | ICD-10-CM | POA: Diagnosis not present

## 2018-08-31 DIAGNOSIS — R351 Nocturia: Secondary | ICD-10-CM | POA: Diagnosis not present

## 2018-08-31 DIAGNOSIS — Z23 Encounter for immunization: Secondary | ICD-10-CM | POA: Diagnosis not present

## 2018-08-31 DIAGNOSIS — Z0000 Encounter for general adult medical examination without abnormal findings: Secondary | ICD-10-CM

## 2018-08-31 DIAGNOSIS — R7303 Prediabetes: Secondary | ICD-10-CM

## 2018-08-31 DIAGNOSIS — E782 Mixed hyperlipidemia: Secondary | ICD-10-CM

## 2018-08-31 DIAGNOSIS — M51369 Other intervertebral disc degeneration, lumbar region without mention of lumbar back pain or lower extremity pain: Secondary | ICD-10-CM

## 2018-08-31 DIAGNOSIS — J309 Allergic rhinitis, unspecified: Secondary | ICD-10-CM | POA: Insufficient documentation

## 2018-08-31 DIAGNOSIS — M159 Polyosteoarthritis, unspecified: Secondary | ICD-10-CM

## 2018-08-31 DIAGNOSIS — J3089 Other allergic rhinitis: Secondary | ICD-10-CM

## 2018-08-31 MED ORDER — FLUTICASONE PROPIONATE 50 MCG/ACT NA SUSP: 2.0000 | Freq: Every day | NASAL | 3 refills | Status: AC

## 2018-08-31 NOTE — Assessment & Plan Note (Signed)
Previously mild uncontrolled cholesterol on some recently poor lifestyle limited exercise Last lipid panel 2018 Calculated ASCVD 10 yr risk score pending new lipids  Plan: 1. Discussed ASCVD risk reduction w/ statin - he declines for now will reconsider based on Lipids 2. Encourage improved lifestyle - low carb/cholesterol, reduce portion size, continue improving regular exercise 3. Check fasting lipids and labs today

## 2018-08-31 NOTE — Progress Notes (Signed)
Subjective:    Patient ID: Seth [name], male    DOB: 1961-06-24, 57 y.o.   MRN: [id number]  Seth [name] is a 57 y.o. male presenting on 08/31/2018 for Annual Exam   HPI   Here for Annual Physical and Due for fasting labs  Pre-Diabetes Reports his history of Pre-DM with some fluctuation. Last A1c 6.0 CBGs: None Meds: Never on Currently NOT ACEi / ARB Lifestyle: - Diet (Avoiding junk food and improving diet, still eating carbs but tries to limit, drinks water, unsweet tea and coffee, rare to no sodas)  - Exercise (Limited regular exercise, active working, he works as Dealer for [employer]) Denies hypoglycemia  HYPERLIPIDEMIA: - Reports no concerns. Last lipid panel 08/2017, with abnormal results with low HDL and elevated TG - No cholesterol medicine - He is fasting today only had black coffee, ready for labs  COPD / TOBACCO ABUSE / Pulmonary Nodules - Active smoker, 1ppd for up to 15 years - Previously quit for 10+ years, then restarted.  He quit cold [place] before. In past tried NRT with limited results - Not ready to quit - Previous dx of COPD with emphysema, without known complication. He does not use inhalers and no recent flare up - Last imaging 04/10/15 CT wo contrast chest - showed scattered pulmonary nodules, largest up to 66mm - recommended repeat CT in 2 years, he has not done this, interested to repeat  FOLLOW-UP DJD in Cervical and Lumbar Spine / Osteoarthritis in joints hands/knees etc Followed by [facility], Dr Phylliss Bob, recent Lumbar R side microdiscectomy 04/07/18, see op report in chart review. He is doing well post op overall has had some issues and still some problems with generalized aches and pains from arthritis in other joints. Back is improved but still causes some problems. - Describes chronic wear and tear from occupation and old injuries - He has chronic intermittent pain sometimes in neck and upper extremity R > L with some  paresthesia and tingling at times, only intermittent with flares - Currently taking Tramadol 50mg  TID - neck and back - Clement Sayres NP at [facility] ([facility]) - for 10 years for neck - Also taking Venlafaxine XR 150mg  - 24 hr for low back pain - Prior imaging on file  Allergic Rhinitis Moved from [place] years ago and thinks [place] environment causes some of these symptoms. He has tried anti histamine before and Flonase, not using now. Used to have recurrent sinusitis. Now has some recent drainage   Health Maintenance: Due for Flu Shot, will receive today   - UTD Hep C negative 2018  - Colon CA Screening: Last Colonoscopy 07/16/14 (done by [facility] Dr Verdie Shire), results with no polyps, good for 10 years, due in 07/2024. Currently asymptomatic. No known family history of colon CA. - will request copy of record today  - Prostate CA Screening: Prior PSA / DRE reported normal 1 year ago unremarkable. Last PSA 0.7 (08/2017). Currently asymptomatic. No known family history of prostate CA. Due for screening PSA  Depression screen [facility] 2/9 08/31/2018 08/18/2017 12/08/2016  Decreased Interest 0 0 0  Down, Depressed, Hopeless 0 0 0  PHQ - 2 Score 0 0 0    Past Medical History:  Diagnosis Date  . Degenerative disc disease, cervical   . Degenerative disc disease, lumbar   . GERD (gastroesophageal reflux disease)   . Leg pain   . Neck pain   . Wears partial dentures  Past Surgical History:  Procedure Laterality Date  . BLADDER SURGERY  2002   removed scar tissue  . CHOLECYSTECTOMY    . COLONOSCOPY    . LUMBAR LAMINECTOMY/DECOMPRESSION MICRODISCECTOMY N/A 04/07/2018   Procedure: RIGHT - SIDED LUMBAR FOUR- FIVE MICRODISECTOMY.;  Surgeon: Phylliss Bob, MD;  Location: [place];  Service: Orthopedics;  Laterality: N/A;  RIGHT - SIDED LUMBAR FOUR- FIVE MICRODISECTOMY.    . MULTIPLE TOOTH EXTRACTIONS     Social History   Socioeconomic History  . Marital  status: Married    Spouse name: Not on file  . Number of children: Not on file  . Years of education: Not on file  . Highest education level: Not on file  Occupational History  . Not on file  Social Needs  . Financial resource strain: Not on file  . Food insecurity:    Worry: Not on file    Inability: Not on file  . Transportation needs:    Medical: Not on file    Non-medical: Not on file  Tobacco Use  . Smoking status: Current Every Day Smoker    Packs/day: 1.00    Years: 15.00    Pack years: 15.00    Types: Cigarettes  . Smokeless tobacco: Current User  Substance and Sexual Activity  . Alcohol use: Yes    Comment: occasionally  . Drug use: No  . Sexual activity: Yes  Lifestyle  . Physical activity:    Days per week: Not on file    Minutes per session: Not on file  . Stress: Not on file  Relationships  . Social connections:    Talks on phone: Not on file    Gets together: Not on file    Attends religious service: Not on file    Active member of club or organization: Not on file    Attends meetings of clubs or organizations: Not on file    Relationship status: Not on file  . Intimate partner violence:    Fear of current or ex partner: Not on file    Emotionally abused: Not on file    Physically abused: Not on file    Forced sexual activity: Not on file  Other Topics Concern  . Not on file  Social History Narrative  . Not on file   Family History  Problem Relation Age of Onset  . Diabetes Mother   . Cancer Mother        bone  . Cancer Father   . Heart disease Father    Current Outpatient Medications on File Prior to Visit  Medication Sig  . ibuprofen (ADVIL,MOTRIN) 800 MG tablet   . traMADol (ULTRAM) 50 MG tablet TK 1 T PO QID  . venlafaxine XR (EFFEXOR XR) 150 MG 24 hr capsule Take 1 capsule (150 mg total) by mouth daily with breakfast.   No current facility-administered medications on file prior to visit.     Review of Systems  Constitutional:  Negative for activity change, appetite change, chills, diaphoresis, fatigue and fever.  HENT: Negative for congestion and hearing loss.   Eyes: Negative for visual disturbance.  Respiratory: Negative for apnea, cough, choking, chest tightness, shortness of breath and wheezing.   Cardiovascular: Negative for chest pain, palpitations and leg swelling.  Gastrointestinal: Negative for abdominal pain, anal bleeding, blood in stool, constipation, diarrhea, nausea and vomiting.  Endocrine: Negative for cold intolerance.  Genitourinary: Negative for decreased urine volume, difficulty urinating, dysuria, frequency, hematuria and urgency.  Musculoskeletal: Positive for  arthralgias (improving, stable) and back pain (stable). Negative for neck pain.  Skin: Negative for rash.  Allergic/Immunologic: Negative for environmental allergies.  Neurological: Negative for dizziness, weakness, light-headedness, numbness and headaches.  Hematological: Negative for adenopathy.  Psychiatric/Behavioral: Negative for behavioral problems, dysphoric mood and sleep disturbance. The patient is not nervous/anxious.    Per HPI unless specifically indicated above     Objective:    BP 130/84   Pulse 84   Temp 97.9 F (36.6 C) (Oral)   Resp 16   Ht 6\' 2"  (1.88 m)   Wt 221 lb 9.6 oz (100.5 kg)   BMI 28.45 kg/m   Wt Readings from Last 3 Encounters:  08/31/18 221 lb 9.6 oz (100.5 kg)  04/07/18 210 lb (95.3 kg)  04/05/18 210 lb 14.4 oz (95.7 kg)    Physical Exam  Constitutional: He is oriented to person, place, and time. He appears well-developed and well-nourished. No distress.  Well-appearing, comfortable, cooperative  HENT:  Head: Normocephalic and atraumatic.  Mouth/Throat: Oropharynx is clear and moist.  Frontal / maxillary sinuses non-tender. Nares patent without purulence or edema. Bilateral TMs clear without erythema, effusion or bulging. Oropharynx clear without erythema, exudates, edema or asymmetry.    Eyes: Pupils are equal, round, and reactive to light. Conjunctivae and EOM are normal. Right eye exhibits no discharge. Left eye exhibits no discharge.  Neck: Normal range of motion. Neck supple. No thyromegaly present.  Cardiovascular: Normal rate, regular rhythm, normal heart sounds and intact distal pulses.  No murmur heard. Pulmonary/Chest: Effort normal and breath sounds normal. No respiratory distress. He has no wheezes. He has no rales.  Abdominal: Soft. Bowel sounds are normal. He exhibits no distension and no mass. There is no tenderness.  Musculoskeletal: Normal range of motion. He exhibits no edema or tenderness.  Upper / Lower Extremities: - Normal muscle tone, strength bilateral upper extremities 5/5, lower extremities 5/5  Lymphadenopathy:    He has no cervical adenopathy.  Neurological: He is alert and oriented to person, place, and time.  Distal sensation intact to light touch all extremities  Skin: Skin is warm and dry. No rash noted. He is not diaphoretic. No erythema.  Psychiatric: He has a normal mood and affect. His behavior is normal.  Well groomed, good eye contact, normal speech and thoughts  Nursing note and vitals reviewed.    I have personally reviewed the radiology report from 04/10/15 on CT Wo Contrast.  CLINICAL DATA:  Right chest and mediastinal pain for 1 year. Small left pulmonary nodules on previous abdominal CT. History of smoking.  EXAM: CT CHEST WITHOUT CONTRAST  TECHNIQUE: Multidetector CT imaging of the chest was performed following the standard protocol without IV contrast.  COMPARISON:  Abdominal CT 09/12/2013  FINDINGS: There is no significant lymphadenopathy on this non contrast examination. No significant pericardial or pleural effusion. No acute abnormality in the upper abdominal structures. No significant large enlargement of the thoracic aorta or main pulmonary arteries.  The trachea and mainstem bronchi are patent. There are  multiple blebs throughout both lungs and findings are suggestive for paraseptal emphysema.Small focal nodular density along the posterior right lower lobe on sequence 4, image 54 could represent atelectasis and was not present on the previous examination. Punctate nodule in the medial right upper lobe on sequence 4, image 19. Again noted are two small nodular densities in the periphery of the left lower lobe which were present on the previous abdominal CT and no significant change. 3 mm nodule  in the left upper lobe on sequence 4, image 23. 5 mm nodule in the left lower lobe on sequence 4, image 47 and minimally changed from the previous examination. No large areas of consolidation.  The central pulmonary vascular structures are prominent and tortuous. Some of the vessels have a beaded appearance and may be aneurysmal. Difficult to exclude vascular malformations. This process appears to be bilateral, particularly in the lower lobes. Representative areas on sequence 4, image 35 and sequence 4, image 34. No acute bone abnormality.  IMPRESSION: Paraseptal emphysema with prominent and tortuous central pulmonary vessels. Findings could be related to the paraseptal emphysema. Many of the vessels have a beaded and nodular appearance. Difficult to exclude small vascular malformations, small aneurysms or vasculitis. Recommend a CTA of the chest to evaluate the pulmonary vasculature for better characterization.  There are scattered small pulmonary nodules throughout the lungs and difficult to differentiate small nodules from the ectatic pulmonary vasculature. Largest definite pulmonary nodule measures 5 mm. Based on the history of smoking and paraseptal emphysema, recommend continued follow-up to ensure at least 2 years of stability of these nodules.   Electronically Signed   By: Markus Daft M.D.   On: 04/10/2015 09:30       Assessment & Plan:   Problem List Items Addressed  This Visit    Allergic rhinitis due to allergen    Mild recurrent symptoms No sign of infection Start nasal steroid Flonase 2 sprays in each nostril daily for 4-6 weeks, may repeat course seasonally or as needed      Relevant Medications   fluticasone (FLONASE) 50 MCG/ACT nasal spray   COPD (chronic obstructive pulmonary disease) (HCC)    Stable COPD without flare up exac Active smoker, chronic tobacco abuse Not on maintenance inhalers  Smoking cessation, not ready to quit Pursue Low dose CT lung scan repeat Follow-up in future may benefit from PFTs staging severity, maintenance therapy      Relevant Medications   fluticasone (FLONASE) 50 MCG/ACT nasal spray   Other Relevant Orders   CBC with Differential/Platelet   Degenerative disc disease, lumbar    See A&P OA/DJD Etiology for his chronic pain, now s/p microdiscectomy from Ortho - done 04/2018 Improved      Relevant Medications   ibuprofen (ADVIL,MOTRIN) 800 MG tablet   traMADol (ULTRAM) 50 MG tablet   Hyperlipidemia    Previously mild uncontrolled cholesterol on some recently poor lifestyle limited exercise Last lipid panel 2018 Calculated ASCVD 10 yr risk score pending new lipids  Plan: 1. Discussed ASCVD risk reduction w/ statin - he declines for now will reconsider based on Lipids 2. Encourage improved lifestyle - low carb/cholesterol, reduce portion size, continue improving regular exercise 3. Check fasting lipids and labs today      Relevant Orders   Lipid panel   TSH   Lung nodules    Prior identified pulmonary nodules multiples, largest >77mm, 2016 CT  Offered repeat Low Dose CT Chest wo contrast for follow-up as it was recommended at last scan to have repeat in 2 years, now 3 years later.  Sent message to [facility] for coordinating smoking cessation / CT screening recommendations, we may need to order our own Chest CT scan in future if he does not meet criteria      Osteoarthritis    Chronic  problem with multiple joint primarily cervical and lumbar spine with DDD, resulting in chronic pain - Known degenerative arthritis in other joints as well, upper /  lower extremity - Followed by Pain Management ([place]) [place] >10 years - Also followed by Cassie Freer Dr Lynann Bologna - recent discectomy lumbar surgery - Continue on current regimen      Relevant Medications   ibuprofen (ADVIL,MOTRIN) 800 MG tablet   traMADol (ULTRAM) 50 MG tablet   Other Relevant Orders   CBC with Differential/Platelet   COMPLETE METABOLIC PANEL WITH GFR   Pre-diabetes    Previously well-controlled Pre-DM with A1c 6 range  Plan:  Check A1c today 1. Not on any therapy currently  2. Encourage improved lifestyle - low carb, low sugar diet, reduce portion size, continue improving regular exercise 3. Follow-up as planned within 6 months likely       Relevant Orders   Hemoglobin A1c   Screening for prostate cancer    Avg / Low risk patient, with prior reported negative screening - Last PSA 0.7 (08/2017), prior values normal - Last DRE 2 years ago reported normal - Clinically asymptomatic  Plan: 1. Reviewed prostate cancer screening guidelines and risks including potential prostate biopsy if abnormal PSA, proceed with yearly PSA for now, and anticipate DRE as needed especially if abnormal PSA or new symptoms      Relevant Orders   PSA    Other Visit Diagnoses    Annual physical exam    -  Primary  Updated Health Maintenance information - Request record of last colonoscopy [facility] - Smoking cessation reviewed. - he is not ready to quit Reviewed recent lab results with patient Encouraged improvement to lifestyle with diet and exercise - Goal of weight loss    Relevant Orders   Hemoglobin A1c   CBC with Differential/Platelet   COMPLETE METABOLIC PANEL WITH GFR   Lipid panel   Needs flu shot       Relevant Orders   Flu Vaccine QUAD 6+ mos PF IM (Fluarix Quad PF) (Completed)    Nocturia       Relevant Orders   PSA      Meds ordered this encounter  Medications  . fluticasone (FLONASE) 50 MCG/ACT nasal spray    Sig: Place 2 sprays into both nostrils daily. Use for 4-6 weeks then stop and use seasonally or as needed.    Dispense:  16 g    Refill:  3   Follow up plan: Return in about 6 months (around 03/02/2019) for PreDM A1c, Smoking, HLD.  Nobie Putnam, DO [facility] 08/31/2018, 9:43 AM

## 2018-08-31 NOTE — Assessment & Plan Note (Signed)
Chronic problem with multiple joint primarily cervical and lumbar spine with DDD, resulting in chronic pain - Known degenerative arthritis in other joints as well, upper / lower extremity - Followed by Pain Management ([place]) [place] >10 years - Also followed by Cassie Freer Dr Lynann Bologna - recent discectomy lumbar surgery - Continue on current regimen

## 2018-08-31 NOTE — Assessment & Plan Note (Signed)
Avg / Low risk patient, with prior reported negative screening - Last PSA 0.7 (08/2017), prior values normal - Last DRE 2 years ago reported normal - Clinically asymptomatic  Plan: 1. Reviewed prostate cancer screening guidelines and risks including potential prostate biopsy if abnormal PSA, proceed with yearly PSA for now, and anticipate DRE as needed especially if abnormal PSA or new symptoms

## 2018-08-31 NOTE — Assessment & Plan Note (Signed)
Prior identified pulmonary nodules multiples, largest >80mm, 2016 CT  Offered repeat Low Dose CT Chest wo contrast for follow-up as it was recommended at last scan to have repeat in 2 years, now 3 years later.  Sent message to [facility] for coordinating smoking cessation / CT screening recommendations, we may need to order our own Chest CT scan in future if he does not meet criteria

## 2018-08-31 NOTE — Assessment & Plan Note (Addendum)
See A&P OA/DJD Etiology for his chronic pain, now s/p microdiscectomy from Ortho - done 04/2018 Improved

## 2018-08-31 NOTE — Assessment & Plan Note (Signed)
Stable COPD without flare up exac °Active smoker, chronic tobacco abuse °Not on maintenance inhalers ° °Smoking cessation, not ready to quit °Pursue Low dose CT lung scan repeat °Follow-up in future may benefit from PFTs staging severity, maintenance therapy °

## 2018-08-31 NOTE — Assessment & Plan Note (Signed)
Previously well-controlled Pre-DM with A1c 6 range  Plan:  Check A1c today 1. Not on any therapy currently  2. Encourage improved lifestyle - low carb, low sugar diet, reduce portion size, continue improving regular exercise 3. Follow-up as planned within 6 months likely

## 2018-08-31 NOTE — Patient Instructions (Addendum)
Thank you for coming to the office today.  If ready to quit smoking let me know!  Start nasal steroid Flonase 2 sprays in each nostril daily for 4-6 weeks, may repeat course seasonally or as needed  Future consider Cholesterol medicine.  May return in 6 months for sugar check if needed   DUE for FASTING BLOOD WORK (no food or drink after midnight before the lab appointment, only water or coffee without cream/sugar on the morning of)  TODAY  - Make sure Lab Only appointment is at about 1 week before your next appointment, so that results will be available  For Lab Results, once available within 2-3 days of blood draw, you can can log in to MyChart online to view your results and a brief explanation. Also, we can discuss results at next follow-up visit.   Please schedule a Follow-up Appointment to: Return in about 6 months (around 03/02/2019) for PreDM A1c, Smoking, HLD.  If you have any other questions or concerns, please feel free to call the office or send a message through [place]. You may also schedule an earlier appointment if necessary.  Additionally, you may be receiving a survey about your experience at our office within a few days to 1 week by e-mail or mail. We value your feedback.  Nobie Putnam, DO [place]

## 2018-08-31 NOTE — Assessment & Plan Note (Signed)
Mild recurrent symptoms No sign of infection Start nasal steroid Flonase 2 sprays in each nostril daily for 4-6 weeks, may repeat course seasonally or as needed

## 2018-08-31 NOTE — Telephone Encounter (Signed)
Attempted to call regarding lung screening scan referral. However, there is no answer and no voicemail option. Will attempt at a later date.

## 2018-09-01 ENCOUNTER — Telehealth: Payer: Self-pay | Admitting: *Deleted

## 2018-09-01 LAB — LIPID PANEL
CHOL/HDL RATIO: 6.2 (calc) — AB (ref <5.0)
Cholesterol: 197 mg/dL (ref <200)
HDL: 32 mg/dL — ABNORMAL LOW (ref >40)
LDL CHOLESTEROL (CALC): 139 mg/dL — AB
NON-HDL CHOLESTEROL (CALC): 165 mg/dL — AB (ref <130)
TRIGLYCERIDES: 133 mg/dL (ref <150)

## 2018-09-01 LAB — HEMOGLOBIN A1C
EAG (MMOL/L): 7.4 (calc)
Hgb A1c MFr Bld: 6.3 % of total Hgb — ABNORMAL HIGH (ref <5.7)
MEAN PLASMA GLUCOSE: 134 (calc)

## 2018-09-01 LAB — CBC WITH DIFFERENTIAL/PLATELET
Basophils Absolute: 82 cells/uL (ref 0–200)
Basophils Relative: 0.9 %
EOS PCT: 2.2 %
Eosinophils Absolute: 200 cells/uL (ref 15–500)
HCT: 45.1 % (ref 38.5–50.0)
Hemoglobin: 15.2 g/dL (ref 13.2–17.1)
Lymphs Abs: 2439 cells/uL (ref 850–3900)
MCH: 29.3 pg (ref 27.0–33.0)
MCHC: 33.7 g/dL (ref 32.0–36.0)
MCV: 87.1 fL (ref 80.0–100.0)
MPV: 10.3 fL (ref 7.5–12.5)
Monocytes Relative: 6.9 %
Neutro Abs: 5751 cells/uL (ref 1500–7800)
Neutrophils Relative %: 63.2 %
PLATELETS: 287 10*3/uL (ref 140–400)
RBC: 5.18 10*6/uL (ref 4.20–5.80)
RDW: 12.2 % (ref 11.0–15.0)
TOTAL LYMPHOCYTE: 26.8 %
WBC: 9.1 10*3/uL (ref 3.8–10.8)
WBCMIX: 628 {cells}/uL (ref 200–950)

## 2018-09-01 LAB — COMPLETE METABOLIC PANEL WITH GFR
AG RATIO: 1.7 (calc) (ref 1.0–2.5)
ALKALINE PHOSPHATASE (APISO): 54 U/L (ref 40–115)
ALT: 17 U/L (ref 9–46)
AST: 14 U/L (ref 10–35)
Albumin: 4.3 g/dL (ref 3.6–5.1)
BILIRUBIN TOTAL: 0.5 mg/dL (ref 0.2–1.2)
BUN: 18 mg/dL (ref 7–25)
CHLORIDE: 104 mmol/L (ref 98–110)
CO2: 26 mmol/L (ref 20–32)
CREATININE: 1.17 mg/dL (ref 0.70–1.33)
Calcium: 9.3 mg/dL (ref 8.6–10.3)
GFR, EST AFRICAN AMERICAN: 81 mL/min/{1.73_m2} (ref >=60)
GFR, EST NON AFRICAN AMERICAN: 70 mL/min/{1.73_m2} (ref >=60)
GLUCOSE: 102 mg/dL — AB (ref 65–99)
Globulin: 2.5 g/dL (calc) (ref 1.9–3.7)
Potassium: 4.7 mmol/L (ref 3.5–5.3)
Sodium: 137 mmol/L (ref 135–146)
TOTAL PROTEIN: 6.8 g/dL (ref 6.1–8.1)

## 2018-09-01 LAB — PSA: PSA: 0.6 ng/mL (ref <=4.0)

## 2018-09-01 LAB — TSH: TSH: 1.37 mIU/L (ref 0.40–4.50)

## 2018-09-01 NOTE — Telephone Encounter (Signed)
Received referral for low dose lung cancer screening CT scan.  Message left at phone number listed in EMR for patient to call either myself or Shawn Perkins back at [phone] to facilitate scheduling the scan.    

## 2018-09-02 ENCOUNTER — Encounter: Payer: Self-pay | Admitting: Family Medicine

## 2018-09-02 ENCOUNTER — Encounter: Payer: Self-pay | Admitting: *Deleted

## 2018-09-02 ENCOUNTER — Telehealth: Payer: Self-pay | Admitting: *Deleted

## 2018-09-02 NOTE — Telephone Encounter (Signed)
Received referral for low dose lung cancer screening CT scan.  Message left at phone number listed in EMR for patient to call either myself or Shawn Perkins back at [phone] to facilitate scheduling the scan.    

## 2018-10-03 DIAGNOSIS — G894 Chronic pain syndrome: Secondary | ICD-10-CM | POA: Diagnosis not present

## 2018-10-03 DIAGNOSIS — M5030 Other cervical disc degeneration, unspecified cervical region: Secondary | ICD-10-CM | POA: Diagnosis not present

## 2018-10-03 DIAGNOSIS — Z79899 Other long term (current) drug therapy: Secondary | ICD-10-CM | POA: Diagnosis not present

## 2018-11-23 ENCOUNTER — Ambulatory Visit (INDEPENDENT_AMBULATORY_CARE_PROVIDER_SITE_OTHER): Payer: 59 | Admitting: Family Medicine

## 2018-11-23 ENCOUNTER — Encounter: Payer: Self-pay | Admitting: Family Medicine

## 2018-11-23 VITALS — BP 132/77 | HR 96 | Temp 98.4°F | Resp 16 | Ht 74.0 in | Wt 220.0 lb

## 2018-11-23 DIAGNOSIS — J441 Chronic obstructive pulmonary disease with (acute) exacerbation: Secondary | ICD-10-CM

## 2018-11-23 DIAGNOSIS — J0100 Acute maxillary sinusitis, unspecified: Secondary | ICD-10-CM | POA: Diagnosis not present

## 2018-11-23 MED ORDER — PREDNISONE 50 MG PO TABS: 50.0000 mg | ORAL_TABLET | Freq: Every day | ORAL | 0 refills | Status: DC

## 2018-11-23 MED ORDER — IPRATROPIUM BROMIDE 0.06 % NA SOLN: 2.0000 | Freq: Four times a day (QID) | NASAL | 0 refills | Status: DC

## 2018-11-23 MED ORDER — ALBUTEROL SULFATE HFA 108 (90 BASE) MCG/ACT IN AERS: 2.0000 | INHALATION_SPRAY | RESPIRATORY_TRACT | 0 refills | Status: DC | PRN

## 2018-11-23 MED ORDER — LEVOFLOXACIN 500 MG PO TABS: 500.0000 mg | ORAL_TABLET | Freq: Every day | ORAL | 0 refills | Status: DC

## 2018-11-23 NOTE — Patient Instructions (Addendum)
Thank you for coming to the office today.  1. It sounds like you had an Upper Respiratory Virus or Allergies that cause sinus drainage that has settled into a Bronchitis, lower respiratory tract infection. I don't have concerns for pneumonia today, and think that this should gradually improve. Once you are feeling better, the cough may take a few weeks to fully resolve.  You do not need antibiotics at this time. However, if it does not improve or your symptoms worsen as we have discussed, please follow-up to determine if we need to change your treatment.  - Start Prednisone 50mg  daily for next 5 days - this will open up lungs allow you to breath better and treat that wheezing or bronchospasm - Use Albuterol inhaler 2 puffs every 4-6 hours around the clock for next 2-3 days, max up to 5 days then use as needed  - IF not improving after 48-72 hours, or develop worsening fever, productive cough then start taking Levaquin antibiotic 500mg  daily x 7 days  - Drink plenty of fluids to improve congestion - You may try over the counter Nasal Saline spray (Simply Saline, Ocean Spray) as needed to reduce congestion. - May take Tylenol (up to 500-1000mg  per dose) 3 times a day as needed for aches and fevers. If it is safe for you to take ibuprofen or advil, you may take these medicines as needed as well.  If your symptoms seem to worsen instead of improve over next several days, including significant fever / chills, worsening shortness of breath, worsening wheezing, or nausea / vomiting and can't take medicines - return sooner or go to hospital Emergency Department for more immediate treatment.   Please schedule a Follow-up Appointment to: Return in about 1 week (around 11/30/2018), or if symptoms worsen or fail to improve, for copd.  If you have any other questions or concerns, please feel free to call the office or send a message through [place]. You may also schedule an earlier appointment if  necessary.  Additionally, you may be receiving a survey about your experience at our office within a few days to 1 week by e-mail or mail. We value your feedback.  Nobie Putnam, DO [place]

## 2018-11-23 NOTE — Progress Notes (Signed)
Subjective:    Patient ID: Seth [name], male    DOB: 1961-03-26, 58 y.o.   MRN: [id number]  Seth [name] is a 58 y.o. male presenting on 11/23/2018 for Nasal Congestion (stuffy nose noticed some blood, cough, chest congestion denies fever, chills or bodyache onset week)  Patient presents for a same day appointment.  HPI   COPD EXACERBATION / SINUSITIS Reports symptoms started about 1 week ago with sinus congestion, initially symptoms started he had some small amt blood tinged congestion with blowing nose, then few days later had some small amt of blood again, he has continued with sinus congestion - Tried some Flonase nasal spray regularly, he ran out recently - Admits productive cough but thicker mucus, worse at night, he feels or hears some "wheezing / rattling", he has known prior history of COPD - Denies any fevers, chills, body aches, nausea vomiting  Health Maintenance: UTD Flu Vaccine  Depression screen [facility] 2/9 11/23/2018 08/31/2018 08/18/2017  Decreased Interest 0 0 0  Down, Depressed, Hopeless 0 0 0  PHQ - 2 Score 0 0 0    Social History   Tobacco Use  . Smoking status: Current Every Day Smoker    Packs/day: 1.00    Years: 15.00    Pack years: 15.00    Types: Cigarettes  . Smokeless tobacco: Current User  Substance Use Topics  . Alcohol use: Yes    Comment: occasionally  . Drug use: No    Review of Systems Per HPI unless specifically indicated above     Objective:    BP 132/77   Pulse 96   Temp 98.4 F (36.9 C) (Oral)   Resp 16   Ht 6\' 2"  (1.88 m)   Wt 220 lb (99.8 kg)   SpO2 95%   BMI 28.25 kg/m   Wt Readings from Last 3 Encounters:  11/23/18 220 lb (99.8 kg)  08/31/18 221 lb 9.6 oz (100.5 kg)  04/07/18 210 lb (95.3 kg)    Physical Exam Vitals signs and nursing note reviewed.  Constitutional:      General: He is not in acute distress.    Appearance: He is well-developed. He is not diaphoretic.     Comments: Well-appearing,  comfortable, cooperative  HENT:     Head: Normocephalic and atraumatic.     Comments: Frontal / maxillary sinuses non-tender. Nares with some deeper turbinate edema with congestion without purulence. Bilateral TMs clear without erythema, effusion or bulging. Oropharynx mild generalized erythema and drainage, without exudates or asymmetry. Eyes:     General:        Right eye: No discharge.        Left eye: No discharge.     Conjunctiva/sclera: Conjunctivae normal.  Neck:     Musculoskeletal: Normal range of motion and neck supple.     Thyroid: No thyromegaly.  Cardiovascular:     Rate and Rhythm: Normal rate and regular rhythm.     Heart sounds: Normal heart sounds. No murmur.  Pulmonary:     Effort: Pulmonary effort is normal. No respiratory distress.     Breath sounds: Wheezing (end expiratory wheezes, coarse) present. No rales.     Comments: Mild reduced air movement, occasional cough Musculoskeletal: Normal range of motion.  Lymphadenopathy:     Cervical: No cervical adenopathy.  Skin:    General: Skin is warm and dry.     Findings: No erythema or rash.  Neurological:     Mental Status: He is alert  and oriented to person, place, and time.  Psychiatric:        Behavior: Behavior normal.     Comments: Well groomed, good eye contact, normal speech and thoughts        Assessment & Plan:   Problem List Items Addressed This Visit    None    Visit Diagnoses    COPD exacerbation ([place])    -  Primary   Relevant Medications   predniSONE (DELTASONE) 50 MG tablet   ipratropium (ATROVENT) 0.06 % nasal spray   levofloxacin (LEVAQUIN) 500 MG tablet   albuterol (PROVENTIL HFA;VENTOLIN HFA) 108 (90 Base) MCG/ACT inhaler   Acute non-recurrent maxillary sinusitis       Relevant Medications   predniSONE (DELTASONE) 50 MG tablet   ipratropium (ATROVENT) 0.06 % nasal spray   levofloxacin (LEVAQUIN) 500 MG tablet      Consistent with mild acute exacerbation of COPD with worsening  productive cough. Marland Kitchenvs- No hypoxia (95% on RA), afebrile, no recent hospitalization  Plan: 1. Start Prednisone 50mg  x 5 day steroid burst - Start Atrovent nasal spray decongestant 2 sprays in each nostril up to 4 times daily for 7 days 2. Order albuterol q 4 hr regularly x 2-3 days 3. Considered antibiotics, however afebrile and no hypoxia, would not be indicated in mild AECOPD, unless recurrent or not improved on steroids - printed rx Levaquin ONLY fill if not improved within 48-72 hours or more 4. Strict return criteria when to go to hospital ED or return if difficulty breathing not improve, future may need chest x-ray   Meds ordered this encounter  Medications  . predniSONE (DELTASONE) 50 MG tablet    Sig: Take 1 tablet (50 mg total) by mouth daily with breakfast.    Dispense:  5 tablet    Refill:  0  . ipratropium (ATROVENT) 0.06 % nasal spray    Sig: Place 2 sprays into both nostrils 4 (four) times daily. For up to 5-7 days then stop.    Dispense:  15 mL    Refill:  0  . levofloxacin (LEVAQUIN) 500 MG tablet    Sig: Take 1 tablet (500 mg total) by mouth daily. For 7 days    Dispense:  7 tablet    Refill:  0  . albuterol (PROVENTIL HFA;VENTOLIN HFA) 108 (90 Base) MCG/ACT inhaler    Sig: Inhale 2 puffs into the lungs every 4 (four) hours as needed for wheezing or shortness of breath (cough).    Dispense:  1 Inhaler    Refill:  0      Follow up plan: Return in about 1 week (around 11/30/2018), or if symptoms worsen or fail to improve, for copd.   Nobie Putnam, [facility] 11/23/2018, 4:16 PM

## 2018-11-24 ENCOUNTER — Encounter: Payer: Self-pay | Admitting: Family Medicine

## 2018-12-27 ENCOUNTER — Ambulatory Visit (INDEPENDENT_AMBULATORY_CARE_PROVIDER_SITE_OTHER): Payer: 59 | Admitting: Vascular Surgery

## 2018-12-27 ENCOUNTER — Encounter (INDEPENDENT_AMBULATORY_CARE_PROVIDER_SITE_OTHER): Payer: Self-pay | Admitting: Vascular Surgery

## 2018-12-27 ENCOUNTER — Encounter (INDEPENDENT_AMBULATORY_CARE_PROVIDER_SITE_OTHER): Payer: Self-pay

## 2018-12-27 ENCOUNTER — Other Ambulatory Visit: Payer: Self-pay

## 2018-12-27 VITALS — BP 146/82 | HR 99 | Resp 10 | Ht 75.0 in | Wt 220.0 lb

## 2018-12-27 DIAGNOSIS — R230 Cyanosis: Secondary | ICD-10-CM | POA: Diagnosis not present

## 2018-12-27 DIAGNOSIS — J431 Panlobular emphysema: Secondary | ICD-10-CM | POA: Diagnosis not present

## 2018-12-27 DIAGNOSIS — M5136 Other intervertebral disc degeneration, lumbar region: Secondary | ICD-10-CM

## 2018-12-27 DIAGNOSIS — F17210 Nicotine dependence, cigarettes, uncomplicated: Secondary | ICD-10-CM

## 2018-12-27 DIAGNOSIS — Z7982 Long term (current) use of aspirin: Secondary | ICD-10-CM

## 2018-12-27 DIAGNOSIS — Z720 Tobacco use: Secondary | ICD-10-CM

## 2018-12-27 DIAGNOSIS — M5030 Other cervical disc degeneration, unspecified cervical region: Secondary | ICD-10-CM

## 2018-12-27 DIAGNOSIS — E782 Mixed hyperlipidemia: Secondary | ICD-10-CM

## 2018-12-27 NOTE — Assessment & Plan Note (Signed)
Continue pulmonary medications and aerosols as already ordered, these medications have been reviewed and there are no changes at this time.   

## 2018-12-27 NOTE — Assessment & Plan Note (Signed)

## 2018-12-27 NOTE — Assessment & Plan Note (Signed)
Neuropathic issues could be contributing to lower extremity symptoms

## 2018-12-27 NOTE — Assessment & Plan Note (Signed)
With the possibility of embolization, I started him on a statin today.

## 2018-12-27 NOTE — Progress Notes (Signed)
Patient ID: Seth [name], male   DOB: 1961/04/17, 58 y.o.   MRN: [id number]  Chief Complaint  Patient presents with  . New Patient (Initial Visit)    HPI Seth [name] is a 58 y.o. male.  I am asked to see the patient by Dr. Annalee Genta for evaluation of left upper extremity cyanosis.  This started abruptly about a month ago in his left hand.  The left third fourth and fifth fingers were predominantly involved.  The fifth finger remains the most severely involved with some improvement in fingers 3 and 4.  He does have a wound on the left third finger that has been there for several weeks from a minor local trauma which has been very slow to heal.  It is very painful.  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  He does have repetitive motion and trauma to the hand over many years.  He is a heavy smoker.  No right arm symptoms currently.  He says when he sleeps at night sometimes he wakes up and his hand is purple and asleep.  He has some mild pain in his legs with walking but nothing that is disabling currently.  He has known severe lumbar and cervical spine disease as well.  He was started on aspirin therapy and referred to our office for further evaluation and treatment.  He has some skin areas that appear to be impending ulcerations on the left fifth finger and the left fifth finger remains significantly cyanotic in the distal tip. Nothing has made it better.  There was no clear cause or inciting event that started the symptoms.   Past Medical History:  Diagnosis Date  . Degenerative disc disease, cervical   . Degenerative disc disease, lumbar   . GERD (gastroesophageal reflux disease)   . Leg pain   . Neck pain   . Wears partial dentures     Past Surgical History:  Procedure Laterality Date  . BLADDER SURGERY  2002   removed scar tissue  . CHOLECYSTECTOMY    . COLONOSCOPY    . LUMBAR LAMINECTOMY/DECOMPRESSION MICRODISCECTOMY N/A 04/07/2018   Procedure: RIGHT -  SIDED LUMBAR FOUR- FIVE MICRODISECTOMY.;  Surgeon: Phylliss Bob, MD;  Location: [facility];  Service: Orthopedics;  Laterality: N/A;  RIGHT - SIDED LUMBAR FOUR- FIVE MICRODISECTOMY.    . MULTIPLE TOOTH EXTRACTIONS      Family History Family History  Problem Relation Age of Onset  . Diabetes Mother   . Cancer Mother        bone  . Cancer Father   . Heart disease Father   no bleeding or clotting disorders  Social History Social History   Tobacco Use  . Smoking status: Current Every Day Smoker    Packs/day: 1.00    Years: 15.00    Pack years: 15.00    Types: Cigarettes  . Smokeless tobacco: Current User  Substance Use Topics  . Alcohol use: Yes    Comment: occasionally  . Drug use: No    No Known Allergies  Current Outpatient Medications  Medication Sig Dispense Refill  . albuterol (PROVENTIL HFA;VENTOLIN HFA) 108 (90 Base) MCG/ACT inhaler Inhale 2 puffs into the lungs every 4 (four) hours as needed for wheezing or shortness of breath (cough). 1 Inhaler 0  . amLODipine (NORVASC) 5 MG tablet Take 5 mg by mouth daily.    Marland Kitchen aspirin EC 81 MG tablet Take 81 mg by mouth daily.    Marland Kitchen  fluticasone (FLONASE) 50 MCG/ACT nasal spray Place 2 sprays into both nostrils daily. Use for 4-6 weeks then stop and use seasonally or as needed. 16 g 3  . ibuprofen (ADVIL,MOTRIN) 800 MG tablet     . ipratropium (ATROVENT) 0.06 % nasal spray Place 2 sprays into both nostrils 4 (four) times daily. For up to 5-7 days then stop. 15 mL 0  . traMADol (ULTRAM) 50 MG tablet TK 1 T PO QID  0  . venlafaxine XR (EFFEXOR XR) 150 MG 24 hr capsule Take 1 capsule (150 mg total) by mouth daily with breakfast. 30 capsule 11  . levofloxacin (LEVAQUIN) 500 MG tablet Take 1 tablet (500 mg total) by mouth daily. For 7 days (Patient not taking: Reported on 12/27/2018) 7 tablet 0  . predniSONE (DELTASONE) 50 MG tablet Take 1 tablet (50 mg total) by mouth daily with breakfast. (Patient not taking: Reported on 12/27/2018) 5 tablet  0   No current facility-administered medications for this visit.       REVIEW OF SYSTEMS (Negative unless checked)  Constitutional: [] Weight loss  [] Fever  [] Chills Cardiac: [] Chest pain   [] Chest pressure   [] Palpitations   [] Shortness of breath when laying flat   [] Shortness of breath at rest   [x] Shortness of breath with exertion. Vascular:  [] Pain in legs with walking   [] Pain in legs at rest   [] Pain in legs when laying flat   [] Claudication   [] Pain in feet when walking  [] Pain in feet at rest  [] Pain in feet when laying flat   [] History of DVT   [] Phlebitis   [] Swelling in legs   [] Varicose veins   [] Non-healing ulcers Pulmonary:   [] Uses home oxygen   [] Productive cough   [] Hemoptysis   [] Wheeze  [x] COPD   [] Asthma Neurologic:  [] Dizziness  [] Blackouts   [] Seizures   [] History of stroke   [] History of TIA  [] Aphasia   [] Temporary blindness   [] Dysphagia   [x] Weakness or numbness in arms   [] Weakness or numbness in legs Musculoskeletal:  [x] Arthritis   [] Joint swelling   [x] Joint pain   [x] Low back pain Hematologic:  [] Easy bruising  [] Easy bleeding   [] Hypercoagulable state   [] Anemic  [] Hepatitis Gastrointestinal:  [] Blood in stool   [] Vomiting blood  [] Gastroesophageal reflux/heartburn   [] Abdominal pain Genitourinary:  [] Chronic kidney disease   [] Difficult urination  [] Frequent urination  [] Burning with urination   [] Hematuria Skin:  [] Rashes   [] Ulcers   [] Wounds Psychological:  [] History of anxiety   []  History of major depression.    Physical Exam BP (!) 146/82 (BP Location: Left Arm, Patient Position: Sitting, Cuff Size: Small)   Pulse 99   Resp 10   Ht 6\' 3"  (1.905 m)   Wt 220 lb (99.8 kg)   BMI 27.50 kg/m  Gen:  WD/WN, NAD Head: Seth [name]/AT, No temporalis wasting. Ear/Nose/Throat: Hearing grossly intact, nares w/o erythema or drainage, oropharynx w/o Erythema/Exudate Eyes: Conjunctiva clear, sclera non-icteric  Neck: trachea midline.  No JVD.  Pulmonary:  Good air  movement, respirations not labored, no use of accessory muscles  Cardiac: RRR, no JVD Vascular:  Vessel Right Left  Radial 2+ Palpable 2+ Palpable                                    Musculoskeletal: M/S 5/5 throughout.  No LE edema.  Cyanosis is present on the left fifth finger  from the mid finger down with impending ulceration.  There is mild cyanosis of fingers 3 and 4 as well.  There is an ulcer or cracked skin on the anterior left third finger at the distal interphalangeal joint which he says has been present for several weeks. Neurologic: Sensation grossly intact in extremities.  Symmetrical.  Speech is fluent. Motor exam as listed above. Psychiatric: Judgment intact, Mood & affect appropriate for pt's clinical situation. Dermatologic: No rashes or ulcers noted.  No cellulitis or open wounds.    Radiology No results found.  Labs No results found for this or any previous visit (from the past 2160 hour(s)).  Assessment/Plan:  Tobacco abuse We had a discussion for approximately 3 minutes regarding the absolute need for smoking cessation due to the deleterious nature of tobacco on the vascular system. We discussed the tobacco use would diminish patency of any intervention, and likely significantly worsen progressio of disease. We discussed multiple agents for quitting including replacement therapy or medications to reduce cravings such as Chantix. The patient voices their understanding of the importance of smoking cessation.   Degenerative disc disease, lumbar Neuropathic issues could be contributing to lower extremity symptoms  COPD (chronic obstructive pulmonary disease) (HCC) Continue pulmonary medications and aerosols as already ordered, these medications have been reviewed and there are no changes at this time.    Disc disease, degenerative, cervical Some of his left hand symptoms could be from neurogenic issues from his cervical spine disease although that is not  typical to cause poorly healing ulceration.  The cyanosis and pain could certainly be from neuropathic disease.  Hyperlipidemia With the possibility of embolization, I started him on a statin today.  Extremity cyanosis The patient has left upper extremity pain and cyanosis.  We discussed a wide variety of possible causes for the issue.  This does not have a typical presentation of Raynaud's disease.  He is a heavy smoker, and Buerger's disease is certainly a possibility.  He was also not on antiplatelet or statin therapy and is a heavy smoker so atheroembolization is possible.  His severe cervical spine disease could be causing some neurogenic issues as well.  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  Each of these would have different treatments.  I have added a statin agent and he is now on aspirin.  Smoking cessation is strongly recommended.  Angiogram should be performed to help delineate the disease process and determine further treatment options.  This could be potentially therapeutic as well if a proximal lesion causing atheroembolization is present.  I have discussed the risks and benefits of the procedure.  The patient voices his understanding and is agreeable to proceed.      Leotis Pain 12/27/2018, 10:15 AM   This note was created with Dragon medical transcription system.  Any errors from dictation are unintentional.

## 2018-12-27 NOTE — Patient Instructions (Signed)
Ulnocarpal Impaction Syndrome Ulnocarpal impaction syndrome is a condition in which the bones, cartilage, and ligament tissue in the wrist become increasingly damaged over time (degenerative condition). This happens when the end of the forearm bone on the little finger side of the hand (ulna) is longer than the other bone (radius) in the forearm (ulnar variance). As a result, the ulnar bone puts excess pressure on the bones, cartilage, and ligaments of the wrist. Normally, the radius bears most of the stress in the wrist. If the stress is on the ulna instead, the end of the ulna begins to wear down the bones and tissues in the wrist. One of the wrist bones (carpal bones) called the lunate bone is commonly affected. Over time, this causes pain and limits movement in the wrist. What are the causes? This condition is usually caused by ulnar variance. You may be born with a long ulna (congenitalulnar variance). You may also develop ulnar variance from overuse or from a previous wrist injury or wrist surgery. What increases the risk? The following factors may make you more likely to develop this condition:  Playing sports or doing work that involves repetitive or forceful motions of the arm, wrist, and hand.  Having had a previous wrist injury or wrist surgery.  Being born with an ulna that is longer than normal. What are the signs or symptoms? Symptoms of this condition start slowly and get worse gradually. Symptoms may include:  Pain around the ulnar side of the wrist. Pain may get worse when: ? Your wrist is bent toward the little finger side. ? You do intense physical activity involving your hand and arm, such as push-ups. ? You grip or grasp objects.  Swelling of the wrist.  Pain when pressing on the ulnar side of the wrist (tenderness).  Limited ability to move the wrist. How is this diagnosed? This condition may be diagnosed based on a physical exam and your medical history. During the  exam, your health care provider will check your wrist for tenderness and for limited or painful motion. You may also have X-rays of your wrist to confirm the diagnosis. How is this treated? Treatment varies depending on the severity of your condition. Treatment may include:  Wearing a splint on your hand and wrist to prevent movement during healing.  Medicines to reduce pain and inflammation, such as nonsteroidal anti-inflammatory drugs (NSAIDs).  Avoiding activities that cause pain.  Doing exercises to strengthen and stretch your arm, wrist, and hand (physical therapy). You may be referred to a physical therapist.  Steroid injections in your wrist.  Surgery. This may be needed if you have severe degeneration in your wrist or if your condition does not improve with nonsurgical treatments. Follow these instructions at home: If you have a splint:   Wear the splint as told by your health care provider. Remove it only as told by your health care provider.  Do not put pressure on any part of the splint until it is fully hardened. This may take several hours.  Loosen the splint if your fingers tingle, become numb, or turn cold and blue.  Do not let your splint get wet if it is not waterproof.  Keep the splint clean. Bathing  Do not take baths, swim, or use a hot tub until your health care provider approves. Ask your health care provider if you can take showers. You may only be allowed to take sponge baths for bathing.  If your splint is not waterproof, cover  it with a watertight covering when you take a bath or a shower. Managing pain, stiffness, and swelling  Move your fingers often to avoid stiffness and to lessen swelling.  Raise (elevate) the injured area above the level of your heart while you are sitting or lying down. Driving  Do not drive or operate heavy machinery while taking prescription pain medicine.  Do not drive while wearing a splint on a hand that you use for  driving. Activity  Return to your normal activities as told by your health care provider. Ask your health care provider what activities are safe for you.  Do range-of-motion exercises only as told by your health care provider. General instructions  Do not use any tobacco products, such as cigarettes, chewing tobacco, and e-cigarettes. Tobacco can delay bone healing. If you need help quitting, ask your health care provider.  Take over-the-counter and prescription medicines only as told by your health care provider.  Keep all follow-up visits as told by your health care provider. This is important. Contact a health care provider if:  Your symptoms get worse or do not improve after treatment.  You have pain, numbness, or coldness in your hand.  Your fingernails turn a dark color, such as blue or gray.  You develop new symptoms. This information is not intended to replace advice given to you by your health care provider. Make sure you discuss any questions you have with your health care provider. Document Released: 10/19/2005 Document Revised: 06/21/2016 Document Reviewed: 10/05/2015 Elsevier Interactive Patient Education  2019 [employer].

## 2018-12-27 NOTE — Assessment & Plan Note (Signed)
The patient has left upper extremity pain and cyanosis.  We discussed a wide variety of possible causes for the issue.  This does not have a typical presentation of Raynaud's disease.  He is a heavy smoker, and Buerger's disease is certainly a possibility.  He was also not on antiplatelet or statin therapy and is a heavy smoker so atheroembolization is possible.  His severe cervical spine disease could be causing some neurogenic issues as well.  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  Each of these would have different treatments.  I have added a statin agent and he is now on aspirin.  Smoking cessation is strongly recommended.  Angiogram should be performed to help delineate the disease process and determine further treatment options.  This could be potentially therapeutic as well if a proximal lesion causing atheroembolization is present.  I have discussed the risks and benefits of the procedure.  The patient voices his understanding and is agreeable to proceed.

## 2018-12-27 NOTE — Assessment & Plan Note (Signed)
Some of his left hand symptoms could be from neurogenic issues from his cervical spine disease although that is not typical to cause poorly healing ulceration.  The cyanosis and pain could certainly be from neuropathic disease.

## 2018-12-28 ENCOUNTER — Other Ambulatory Visit (INDEPENDENT_AMBULATORY_CARE_PROVIDER_SITE_OTHER): Payer: Self-pay | Admitting: Nurse Practitioner

## 2018-12-30 ENCOUNTER — Telehealth (INDEPENDENT_AMBULATORY_CARE_PROVIDER_SITE_OTHER): Payer: Self-pay

## 2018-12-30 NOTE — Telephone Encounter (Signed)
Spoke with the patient and gave him his pre-preprocedure instructions as well as his day/time for his procedure with Dr. Lucky Cowboy on 01/02/2019 with a 9:00 am arrival time.

## 2019-01-02 ENCOUNTER — Telehealth (INDEPENDENT_AMBULATORY_CARE_PROVIDER_SITE_OTHER): Payer: Self-pay | Admitting: Vascular Surgery

## 2019-01-02 ENCOUNTER — Other Ambulatory Visit: Payer: Self-pay

## 2019-01-02 ENCOUNTER — Ambulatory Visit
Admission: RE | Admit: 2019-01-02 | Discharge: 2019-01-02 | Disposition: A | Discharge status: Home / Self Care | Payer: 59 | Attending: Vascular Surgery | Admitting: Vascular Surgery

## 2019-01-02 ENCOUNTER — Encounter
Admission: RE | Disposition: A | Discharge status: Home / Self Care | Payer: Self-pay | Source: Home / Self Care | Attending: Vascular Surgery

## 2019-01-02 DIAGNOSIS — R230 Cyanosis: Secondary | ICD-10-CM | POA: Insufficient documentation

## 2019-01-02 DIAGNOSIS — L98499 Non-pressure chronic ulcer of skin of other sites with unspecified severity: Secondary | ICD-10-CM

## 2019-01-02 DIAGNOSIS — Z7982 Long term (current) use of aspirin: Secondary | ICD-10-CM | POA: Diagnosis not present

## 2019-01-02 DIAGNOSIS — I998 Other disorder of circulatory system: Secondary | ICD-10-CM | POA: Diagnosis not present

## 2019-01-02 DIAGNOSIS — Z79899 Other long term (current) drug therapy: Secondary | ICD-10-CM | POA: Diagnosis not present

## 2019-01-02 DIAGNOSIS — E785 Hyperlipidemia, unspecified: Secondary | ICD-10-CM | POA: Diagnosis not present

## 2019-01-02 DIAGNOSIS — K219 Gastro-esophageal reflux disease without esophagitis: Secondary | ICD-10-CM | POA: Diagnosis not present

## 2019-01-02 DIAGNOSIS — F17210 Nicotine dependence, cigarettes, uncomplicated: Secondary | ICD-10-CM | POA: Diagnosis not present

## 2019-01-02 DIAGNOSIS — M5030 Other cervical disc degeneration, unspecified cervical region: Secondary | ICD-10-CM | POA: Diagnosis not present

## 2019-01-02 DIAGNOSIS — J449 Chronic obstructive pulmonary disease, unspecified: Secondary | ICD-10-CM | POA: Insufficient documentation

## 2019-01-02 HISTORY — PX: UPPER EXTREMITY ANGIOGRAPHY: CATH118270

## 2019-01-02 LAB — CREATININE, SERUM
Creatinine, Ser: 1.01 mg/dL (ref 0.61–1.24)
GFR calc Af Amer: >60 mL/min (ref >60)
GFR calc non Af Amer: >60 mL/min (ref >60)

## 2019-01-02 LAB — BUN: BUN: 17 mg/dL (ref 6–20)

## 2019-01-02 SURGERY — UPPER EXTREMITY ANGIOGRAPHY
Anesthesia: Moderate Sedation | Laterality: Left

## 2019-01-02 MED ORDER — HEPARIN (PORCINE) IN NACL 1000-0.9 UT/500ML-% IV SOLN
INTRAVENOUS | Status: AC
  Filled 2019-01-02: qty 1000

## 2019-01-02 MED ORDER — MIDAZOLAM HCL 5 MG/5ML IJ SOLN
INTRAMUSCULAR | Status: AC
  Filled 2019-01-02: qty 5

## 2019-01-02 MED ORDER — FAMOTIDINE 20 MG PO TABS: 40.0000 mg | ORAL_TABLET | Freq: Once | ORAL | Status: DC | PRN

## 2019-01-02 MED ORDER — HEPARIN SODIUM (PORCINE) 1000 UNIT/ML IJ SOLN
INTRAMUSCULAR | Status: DC | PRN
  Administered 2019-01-02: 3000 [IU] via INTRAVENOUS

## 2019-01-02 MED ORDER — SODIUM CHLORIDE 0.9 % IV SOLN
INTRAVENOUS | Status: DC
  Administered 2019-01-02: 09:00:00 via INTRAVENOUS

## 2019-01-02 MED ORDER — HYDROMORPHONE HCL 1 MG/ML IJ SOLN: 1.0000 mg | Freq: Once | INTRAMUSCULAR | Status: DC | PRN

## 2019-01-02 MED ORDER — METHYLPREDNISOLONE SODIUM SUCC 125 MG IJ SOLR: 125.0000 mg | Freq: Once | INTRAMUSCULAR | Status: DC | PRN

## 2019-01-02 MED ORDER — FENTANYL CITRATE (PF) 100 MCG/2ML IJ SOLN
INTRAMUSCULAR | Status: DC | PRN
  Administered 2019-01-02: 50 ug via INTRAVENOUS
  Administered 2019-01-02: 25 ug via INTRAVENOUS

## 2019-01-02 MED ORDER — HEPARIN SODIUM (PORCINE) 1000 UNIT/ML IJ SOLN
INTRAMUSCULAR | Status: AC
  Filled 2019-01-02: qty 1

## 2019-01-02 MED ORDER — CEFAZOLIN SODIUM-DEXTROSE 2-4 GM/100ML-% IV SOLN
INTRAVENOUS | Status: AC
  Filled 2019-01-02: qty 100

## 2019-01-02 MED ORDER — ONDANSETRON HCL 4 MG/2ML IJ SOLN: 4.0000 mg | Freq: Four times a day (QID) | INTRAMUSCULAR | Status: DC | PRN

## 2019-01-02 MED ORDER — LIDOCAINE-EPINEPHRINE (PF) 1 %-1:200000 IJ SOLN
INTRAMUSCULAR | Status: AC
  Filled 2019-01-02: qty 30

## 2019-01-02 MED ORDER — DIPHENHYDRAMINE HCL 50 MG/ML IJ SOLN: 50.0000 mg | Freq: Once | INTRAMUSCULAR | Status: DC | PRN

## 2019-01-02 MED ORDER — MIDAZOLAM HCL 2 MG/ML PO SYRP: 8.0000 mg | ORAL_SOLUTION | Freq: Once | ORAL | Status: DC | PRN

## 2019-01-02 MED ORDER — DEXTROSE 5 % IV SOLN
2.0000 g | Freq: Once | INTRAVENOUS | Status: AC
  Administered 2019-01-02: 2 g via INTRAVENOUS
  Filled 2019-01-02: qty 20

## 2019-01-02 MED ORDER — MIDAZOLAM HCL 2 MG/2ML IJ SOLN
INTRAMUSCULAR | Status: DC | PRN
  Administered 2019-01-02: 2 mg via INTRAVENOUS
  Administered 2019-01-02: 1 mg via INTRAVENOUS

## 2019-01-02 MED ORDER — IOHEXOL 300 MG/ML  SOLN
INTRAMUSCULAR | Status: DC | PRN
  Administered 2019-01-02: 65 mL via INTRAVENOUS

## 2019-01-02 MED ORDER — FENTANYL CITRATE (PF) 100 MCG/2ML IJ SOLN
INTRAMUSCULAR | Status: AC
  Filled 2019-01-02: qty 2

## 2019-01-02 SURGICAL SUPPLY — 17 items
BALLN ULTRVRSE 2X220X150 (BALLOONS) ×2
BALLOON ULTRVRSE 2X220X150 (BALLOONS) ×1 IMPLANT
CATH ANGIO 5F 100CM .035 PIG (CATHETERS) ×2 IMPLANT
CATH BEACON 5 .035 100 H1 TIP (CATHETERS) ×2 IMPLANT
CATH CXI SUPP ANG 4FR 135 (CATHETERS) ×1 IMPLANT
CATH CXI SUPP ANG 4FR 135CM (CATHETERS) ×2
CATH CXI SUPP ST 4FR 135CM (CATHETERS) IMPLANT
DEVICE PRESTO INFLATION (MISCELLANEOUS) ×2 IMPLANT
DEVICE STARCLOSE SE CLOSURE (Vascular Products) ×2 IMPLANT
DEVICE TORQUE .025-.038 (MISCELLANEOUS) ×2 IMPLANT
PACK ANGIOGRAPHY (CUSTOM PROCEDURE TRAY) ×2 IMPLANT
SHEATH BRITE TIP 5FRX11 (SHEATH) ×2 IMPLANT
SYR MEDRAD MARK 7 150ML (SYRINGE) ×2 IMPLANT
TUBING CONTRAST HIGH PRESS 72 (TUBING) ×2 IMPLANT
WIRE AQUATRACK .035X260CM (WIRE) ×2 IMPLANT
WIRE G V18X300CM (WIRE) ×2 IMPLANT
WIRE J 3MM .035X145CM (WIRE) ×2 IMPLANT

## 2019-01-02 NOTE — Op Note (Signed)
OPERATIVE REPORT   PREOPERATIVE DIAGNOSIS: 1.  Left hand ischemia with fifth digital ulceration and cyanosis  POSTOPERATIVE DIAGNOSIS: Same as above  PROCEDURE PERFORMED: 1. Ultrasound guidance vascular access to right femoral artery. 2. Catheter placement to left radial artery and left ulnar arteries  from right femoral approach. 3. Thoracic aortogram and selective left upper extremity angiogram  including selective images of the radial and ulnar arteries. 4.  Percutaneous transluminal angioplasty of the left ulnar artery with 2 mm diameter angioplasty balloon 5. StarClose closure device right femoral artery.  SURGEON: Algernon Huxley, MD  ANESTHESIA: Local with moderate conscious sedation for 35 minutes using 3 mg of Versed and 75 Mcg of Fentanyl  BLOOD LOSS: Minimal.  FLUOROSCOPY TIME:9.3 minutes  INDICATION FOR PROCEDURE: This is a 58 y.o.male who presented to our office with pain, cyanosis, and ulceration of the left fifth finger.  He has had some cyanosis of multiple fingers in the left hand but the fourth and fifth fingers are the most painful.  Multiple possibilities are included in the etiology including Buerger's disease, ulnar hammer syndrome, and embolization so an angiogram is necessary for further evaluation and potential treatment.  Risks and benefits are discussed. Informed consent was obtained.  DESCRIPTION OF PROCEDURE: The patient was brought to the vascular suite. Moderate conscious sedation was administered during a face to face encounter with the patient throughout the procedure with my supervision of the RN administering medicines and monitoring the patient's vital signs, pulse oximetry, telemetry and mental status throughout from the start of the procedure until the patient was taken to the recovery room.  Groins were shaved and prepped and sterile surgical field was created. The right femoral head was localized with fluoroscopy and  the right femoral artery was then visualized with ultrasound and found to be widely patent. It was then accessed under direct ultrasound guidance without difficulty with a Seldinger needle and a permanent image was recorded. A J-wire and 5-French sheath were then placed. Pigtail catheter was placed into the ascending aorta and a thoracic aortogram was then performed in the LAO projection. This demonstrated normal origins to the great vessels without significant proximal stenoses and a normal configuration of the great vessels. The patient was given 3000 units of intravenous heparin and a headhunter catheter was used to selectively cannulate the left subclavian artery without difficulty. This was then sequentially advanced to the brachial artery and to the brachial bifurcation which was actually in the proximal to mid upper arm and a high location.  There was no significant stenosis, aneurysm, or source of embolization in the proximal left subclavian artery, left axillary artery, or left brachial artery proximal to the bifurcation.  After exchanging for a CXI catheter, I cannulated the left ulnar artery and selective imaging was performed.  The left ulnar artery occluded at the wrist and was a smaller vessel than the radial artery.  There were no obvious corkscrews or appearance of Buerger's disease.  Although no obvious aneurysm was seen this had a appearance such as repetitive trauma injury to the ulnar artery at the wrist.  Using the CXI catheter and the 0.018 wire was able to advance a wire into the hand although I cannot get into the true palmar arch.  A 2 mm diameter by 22 cm length angioplasty balloon was inflated from the ulnar artery in the proximal hand back to the mid forearm.  It was inflated to 18 atm for 1 minute.  Completion imaging still showed residual occlusion  of the ulnar artery at the wrist and a short segment although more proximal portion of the ulnar artery now demonstrated flow  down to this point in a much more brisk fashion after angioplasty.  I felt this was likely something that may have to be addressed surgically but I wanted to evaluate the radial artery for further evaluation of his hand perfusion.  Moving back to the upper arm, using the CXI catheter and a V 18 wire was able to selectively cannulate the radial artery and advanced this into the radial artery in the proximal forearm.  The radial artery demonstrated no significant stenosis or abnormalities and was a large vessel continuous to the hand.  It did fill all 5 vessels although the fifth digit was filled very sluggishly in the fourth digit was filled somewhat sluggishly.  At this point, no further endovascular therapy is likely to be of benefit. The diagnostic catheter was removed. Oblique arteriogram was performed of the right femoral artery and StarClose closure device deployed in the usual fashion with excellent hemostatic result. The patient tolerated the procedure well and was taken to the recovery room in stable condition.   Leotis Pain 01/02/2019 11:38 AM

## 2019-01-02 NOTE — H&P (Signed)
[facility] History & Physical Update  The patient was interviewed and re-examined.  The patient's previous History and Physical has been reviewed and is unchanged.  There is no change in the plan of care. We plan to proceed with the scheduled procedure.  Leotis Pain, MD  01/02/2019, 9:03 AM

## 2019-01-03 ENCOUNTER — Encounter: Payer: Self-pay | Admitting: Vascular Surgery

## 2019-01-10 ENCOUNTER — Telehealth (INDEPENDENT_AMBULATORY_CARE_PROVIDER_SITE_OTHER): Payer: Self-pay | Admitting: Vascular Surgery

## 2019-01-10 NOTE — Telephone Encounter (Signed)
This patient was last seen on 12/27/2018. See note below.

## 2019-01-13 NOTE — Telephone Encounter (Signed)
Spoke with the patient and let him know a referral has been placed at orthopedic surgery in [place] and their office will be contacting him to get him scheduled.

## 2019-01-18 ENCOUNTER — Encounter (INDEPENDENT_AMBULATORY_CARE_PROVIDER_SITE_OTHER): Payer: Self-pay | Admitting: Orthopedic Surgery

## 2019-01-18 ENCOUNTER — Other Ambulatory Visit (INDEPENDENT_AMBULATORY_CARE_PROVIDER_SITE_OTHER): Payer: Self-pay

## 2019-01-18 ENCOUNTER — Other Ambulatory Visit: Payer: Self-pay

## 2019-01-18 ENCOUNTER — Ambulatory Visit (INDEPENDENT_AMBULATORY_CARE_PROVIDER_SITE_OTHER): Payer: 59 | Admitting: Orthopedic Surgery

## 2019-01-18 VITALS — Ht 75.0 in | Wt 220.0 lb

## 2019-01-18 DIAGNOSIS — F17200 Nicotine dependence, unspecified, uncomplicated: Secondary | ICD-10-CM | POA: Diagnosis not present

## 2019-01-18 DIAGNOSIS — I70208 Unspecified atherosclerosis of native arteries of extremities, other extremity: Secondary | ICD-10-CM | POA: Diagnosis not present

## 2019-01-18 MED ORDER — PENTOXIFYLLINE ER 400 MG PO TBCR: 400.0000 mg | EXTENDED_RELEASE_TABLET | Freq: Three times a day (TID) | ORAL | 3 refills | Status: DC

## 2019-01-19 ENCOUNTER — Encounter (INDEPENDENT_AMBULATORY_CARE_PROVIDER_SITE_OTHER): Payer: Self-pay | Admitting: Orthopedic Surgery

## 2019-01-19 ENCOUNTER — Telehealth (INDEPENDENT_AMBULATORY_CARE_PROVIDER_SITE_OTHER): Payer: Self-pay | Admitting: Orthopedic Surgery

## 2019-01-19 DIAGNOSIS — S55002A Unspecified injury of ulnar artery at forearm level, left arm, initial encounter: Secondary | ICD-10-CM | POA: Insufficient documentation

## 2019-01-19 NOTE — Telephone Encounter (Signed)
01/18/2019 ov note & demo ,angiogram faxed to Dr Burney Gauze office for appt 01/19/2019

## 2019-01-19 NOTE — Progress Notes (Signed)
Office Visit Note   Patient: Seth [name]           Date of Birth: [birth year]           MRN: [id number] Visit Date: 01/18/2019              Requested by: Algernon Huxley, MD [address] [place], [place] [address] PCP: Olin Hauser, DO  Chief Complaint  Patient presents with   Left Hand - Pain      HPI: Patient is a 58 year old gentleman who was seen for initial evaluation and and consultation from [facility].  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  Patient was seen by Dr. Lucky Cowboy and arterial studies were obtained which showed an occlusion of the ulnar artery at the wrist.  Patient underwent balloon angioplasty but has had recurrent occlusion with pain and a small ulcer over the tip of the left little finger and a slow healing cut on the long finger.   Assessment & Plan: Visit Diagnoses:  1. Obstruction of left ulnar artery (HCC)     Plan: I will start him on Trental 400 mg 3 times a day to help improve his microcirculation.  I discussed that he must stop smoking 100%.  I consulted Dr. Burney Gauze and he states he will follow-up with the patient in consultation for evaluation for arterial grafting at the wrist.  I also discussed the case with Dr. early of vascular vein surgery and he agrees with hand surgery consultation.  Follow-Up Instructions: Return if symptoms worsen or fail to improve.   Ortho Exam  Patient is alert, oriented, no adenopathy, well-dressed, normal affect, normal respiratory effort. Examination patient has slow capillary refill in his fingers on the left hand.  He is right-hand dominant.  Patient has a positive Allen's test with occlusion of the radial artery there is no refill within his hand.  With release of the radial artery he does have complete refill to all digits.  Doppler was used and patient has a strong  triphasic pulse on the radial artery there is a biphasic signal ulnarly most likely with full occlusion, he is currently on 81 mg of aspirin a day patient has a small ischemic ulcer over the tip of the little finger and a cut on the long finger which is slow healing there is no gangrenous changes his hand has good turgor.  His capillary refill is greater than 3 seconds.  Patient is a smoker.  Imaging: No results found. No images are attached to the encounter.  Labs: Lab Results  Component Value Date   HGBA1C 6.3 (H) 08/31/2018   HGBA1C 6.0 (H) 08/18/2017   HGBA1C 6.2 (H) 05/20/2016     Lab Results  Component Value Date   ALBUMIN 4.0 04/05/2018   ALBUMIN 4.1 05/20/2016   ALBUMIN 3.8 12/29/2012    Body mass index is 27.5 kg/m.  Orders:  No orders of the defined types were placed in this encounter.  Meds ordered this encounter  Medications   pentoxifylline (TRENTAL) 400 MG CR tablet    Sig: Take 1 tablet (400 mg total) by mouth 3 (three) times daily with meals.    Dispense:  90 tablet    Refill:  3     Procedures: No procedures performed  Clinical Data: No additional findings.  ROS:  All  other systems negative, except as noted in the HPI. Review of Systems  Objective: Vital Signs: Ht 6\' 3"  (1.905 m)    Wt 220 lb (99.8 kg)    BMI 27.50 kg/m   Specialty Comments:  No specialty comments available.  PMFS History: Patient Active Problem List   Diagnosis Date Noted   Extremity cyanosis 12/27/2018   Allergic rhinitis due to allergen 08/31/2018   Hyperlipidemia 08/18/2017   Degenerative disc disease, lumbar 08/18/2017   Screening for colon cancer 08/18/2017   Screening for prostate cancer 08/18/2017   ANA positive 10/29/2015   Arthralgia of multiple joints 10/29/2015   Bilateral chronic knee pain 10/29/2015   Elevated rheumatoid factor 10/29/2015   COPD (chronic obstructive pulmonary disease) ([facility]) 10/09/2015   Pre-diabetes 06/11/2015   Disc  disease, degenerative, cervical 06/11/2015   Chronic back pain 06/11/2015   Osteoarthritis 06/11/2015   Lung nodules 06/11/2015   Abdominal pain, right lower quadrant 06/11/2015   Gonalgia 06/11/2015   Tobacco abuse 06/11/2015   Past Medical History:  Diagnosis Date   Degenerative disc disease, cervical    Degenerative disc disease, lumbar    GERD (gastroesophageal reflux disease)    Leg pain    Neck pain    Wears partial dentures     Family History  Problem Relation Age of Onset   Diabetes Mother    Cancer Mother        bone   Cancer Father    Heart disease Father     Past Surgical History:  Procedure Laterality Date   BLADDER SURGERY  2002   removed scar tissue   CHOLECYSTECTOMY     COLONOSCOPY     LUMBAR LAMINECTOMY/DECOMPRESSION MICRODISCECTOMY N/A 04/07/2018   Procedure: RIGHT - SIDED LUMBAR FOUR- FIVE MICRODISECTOMY.;  Surgeon: Phylliss Bob, MD;  Location: [facility];  Service: Orthopedics;  Laterality: N/A;  RIGHT - SIDED LUMBAR FOUR- FIVE MICRODISECTOMY.     MULTIPLE TOOTH EXTRACTIONS     UPPER EXTREMITY ANGIOGRAPHY Left 01/02/2019   Procedure: UPPER EXTREMITY ANGIOGRAPHY;  Surgeon: Algernon Huxley, MD;  Location: [facility];  Service: Cardiovascular;  Laterality: Left;   Social History   Occupational History   Not on file  Tobacco Use   Smoking status: Current Every Day Smoker    Packs/day: 1.00    Years: 15.00    Pack years: 15.00    Types: Cigarettes   Smokeless tobacco: Current User  Substance and Sexual Activity   Alcohol use: Yes    Comment: occasionally   Drug use: No   Sexual activity: Yes

## 2019-01-21 ENCOUNTER — Other Ambulatory Visit: Payer: Self-pay | Admitting: Family Medicine

## 2019-01-21 DIAGNOSIS — M5442 Lumbago with sciatica, left side: Principal | ICD-10-CM

## 2019-01-21 DIAGNOSIS — G8929 Other chronic pain: Secondary | ICD-10-CM

## 2019-01-21 DIAGNOSIS — J431 Panlobular emphysema: Secondary | ICD-10-CM

## 2019-01-21 DIAGNOSIS — M5441 Lumbago with sciatica, right side: Principal | ICD-10-CM

## 2019-01-31 ENCOUNTER — Ambulatory Visit (INDEPENDENT_AMBULATORY_CARE_PROVIDER_SITE_OTHER): Payer: 59 | Admitting: Vascular Surgery

## 2019-02-24 ENCOUNTER — Other Ambulatory Visit: Payer: Self-pay | Admitting: Family Medicine

## 2019-02-24 DIAGNOSIS — R7303 Prediabetes: Secondary | ICD-10-CM

## 2019-02-27 ENCOUNTER — Other Ambulatory Visit: Payer: Self-pay

## 2019-02-27 ENCOUNTER — Other Ambulatory Visit: Payer: 59

## 2019-02-27 DIAGNOSIS — R7303 Prediabetes: Secondary | ICD-10-CM

## 2019-02-28 LAB — HEMOGLOBIN A1C
Hgb A1c MFr Bld: 6.4 % of total Hgb — ABNORMAL HIGH (ref <5.7)
Mean Plasma Glucose: 137 (calc)
eAG (mmol/L): 7.6 (calc)

## 2019-03-02 ENCOUNTER — Other Ambulatory Visit: Payer: Self-pay

## 2019-03-02 ENCOUNTER — Other Ambulatory Visit: Payer: Self-pay | Admitting: Family Medicine

## 2019-03-02 ENCOUNTER — Encounter: Payer: Self-pay | Admitting: Family Medicine

## 2019-03-02 ENCOUNTER — Ambulatory Visit (INDEPENDENT_AMBULATORY_CARE_PROVIDER_SITE_OTHER): Payer: 59 | Admitting: Family Medicine

## 2019-03-02 DIAGNOSIS — R351 Nocturia: Secondary | ICD-10-CM

## 2019-03-02 DIAGNOSIS — E782 Mixed hyperlipidemia: Secondary | ICD-10-CM

## 2019-03-02 DIAGNOSIS — R7303 Prediabetes: Secondary | ICD-10-CM

## 2019-03-02 DIAGNOSIS — I70208 Unspecified atherosclerosis of native arteries of extremities, other extremity: Secondary | ICD-10-CM | POA: Insufficient documentation

## 2019-03-02 DIAGNOSIS — Z0000 Encounter for general adult medical examination without abnormal findings: Secondary | ICD-10-CM

## 2019-03-02 NOTE — Progress Notes (Signed)
Subjective:    Patient ID: Seth [name], male    DOB: [birth year], 58 y.o.   MRN: [id number]  Seth [name] is a 58 y.o. male presenting on 03/02/2019 for Hyperlipidemia (pre-diabetes A1C - 6.4% 4/27)  Virtual / Telehealth Encounter - Video Visit via [email or web address] The purpose of this virtual visit is to provide medical care while limiting exposure to the novel coronavirus (COVID19) for both patient and office staff.  Consent was obtained for remote visit:  Yes.   Answered questions that patient had about telehealth interaction:  Yes.   I discussed the limitations, risks, security and privacy concerns of performing an evaluation and management service by video/telephone. I also discussed with the patient that there may be a patient responsible charge related to this service. The patient expressed understanding and agreed to proceed.  Patient Location: Home Provider Location: [facility] (Office)   HPI   Pre-Diabetes Recent lab shows A1c up to 6.4, previously 6.3, then prior 6.0 to 6.2. He attributes to some poor dietary habits more recently with coronavirus pandemic issue. CBGs:None Meds:Never on CurrentlyNOTACEi / ARB Lifestyle: - Diet (plans to resume better low carb sugar diet, drinks mostly water some unsweet drinks) - Exercise (Limited regular exercise, active working as Dealer) Denies hypoglycemia  Follow-up Obstruction of Left Ulnar Artery Recent updates, from specialist orthopedics (Dr Sharol Given) and hand orthopedic (Dr Burney Gauze) and Vascular (Dr Lucky Cowboy), patient was diagnosed a left wrist hyperthenar hammer syndrome and left ulnar artery obstruction after worsening pain and numbness cooler sensation for months, had arteriogram in March 2020, showed artery occlusion, the most recent plan is to follow-up and consider surgical options for considering direct repair or vein grafting.   Depression screen [facility] 2/9 03/02/2019 11/23/2018 08/31/2018  Decreased Interest 0 0  0  Down, Depressed, Hopeless 0 0 0  PHQ - 2 Score 0 0 0    Social History   Tobacco Use  . Smoking status: Current Every Day Smoker    Packs/day: 1.00    Years: 15.00    Pack years: 15.00    Types: Cigarettes  . Smokeless tobacco: Never Used  Substance Use Topics  . Alcohol use: Yes    Comment: occasionally  . Drug use: No    Review of Systems Per HPI unless specifically indicated above     Objective:    There were no vitals taken for this visit.  Wt Readings from Last 3 Encounters:  01/18/19 220 lb (99.8 kg)  01/02/19 220 lb (99.8 kg)  12/27/18 220 lb (99.8 kg)    Physical Exam   Note examination was completely remotely via video observation objective data only  Gen - well-appearing, no acute distress or apparent pain, comfortable HEENT - eyes appear clear without discharge or redness Heart/Lungs - cannot examine virtually - observed no evidence of coughing or labored breathing. MSK - viewed left hand/wrist, difficulty in visualizing area of L wrist, no significant abnormality Skin - face visible today- no rash Neuro - awake, alert, oriented Psych - not anxious appearing   Recent Labs    08/31/18 0930 02/27/19 0854  HGBA1C 6.3* 6.4*     Results for orders placed or performed in visit on 02/27/19  HgB A1c  Result Value Ref Range   Hgb A1c MFr Bld 6.4 (H) <5.7 % of total Hgb   Mean Plasma Glucose 137 (calc)   eAG (mmol/L) 7.6 (calc)      Assessment & Plan:   Problem List Items  Addressed This Visit    Obstruction of left ulnar artery (HCC)    Followed by Hand Ortho / vascular surgery Identified on arteriogram, 01/2019 Underlying issue of LEFT hyperthenar issue  Future f/u with specialist to determine if need other definitive treatment      Pre-diabetes - Primary    Elevated PreDM A1c up to 6.4, at risk of future DM - attributed to poor diet recently  Plan:  1. Not on any therapy currently - offer metformin low dose, declined, will focus on  lifestyle first 2. Encourage improved lifestyle - low carb, low sugar diet, reduce portion size, continue improving regular exercise 3. Follow-up 6 months annual phys and labs          No orders of the defined types were placed in this encounter.   Follow-up: - Return in 6 months for Annual Physical - Future labs for 08/2019 approx  Patient verbalizes understanding with the above medical recommendations including the limitation of remote medical advice.  Specific follow-up and call-back criteria were given for patient to follow-up or seek medical care more urgently if needed.  Total duration of direct patient care provided via video conference: 15 minutes   Nobie Putnam, [facility] 03/02/2019, 8:13 AM

## 2019-03-02 NOTE — Assessment & Plan Note (Signed)
Followed by Hand Ortho / vascular surgery Identified on arteriogram, 01/2019 Underlying issue of LEFT hyperthenar issue  Future f/u with specialist to determine if need other definitive treatment

## 2019-03-02 NOTE — Assessment & Plan Note (Signed)
Elevated PreDM A1c up to 6.4, at risk of future DM - attributed to poor diet recently  Plan:  1. Not on any therapy currently - offer metformin low dose, declined, will focus on lifestyle first 2. Encourage improved lifestyle - low carb, low sugar diet, reduce portion size, continue improving regular exercise 3. Follow-up 6 months annual phys and labs

## 2019-03-02 NOTE — Patient Instructions (Addendum)
Recent Labs    08/31/18 0930 02/27/19 0854  HGBA1C 6.3* 6.4*   Similar to last time, but still slightly elevated - Improve diet low carb low sugar as we discussed.   DUE for FASTING BLOOD WORK (no food or drink after midnight before the lab appointment, only water or coffee without cream/sugar on the morning of)  SCHEDULE "Lab Only" visit in the morning at the clinic for lab draw in 6 MONTHS   - Make sure Lab Only appointment is at about 1 week before your next appointment, so that results will be available  For Lab Results, once available within 2-3 days of blood draw, you can can log in to MyChart online to view your results and a brief explanation. Also, we can discuss results at next follow-up visit.   Please schedule a Follow-up Appointment to: Return in about 6 months (around 09/01/2019) for Annual Physical.  If you have any other questions or concerns, please feel free to call the office or send a message through [place]. You may also schedule an earlier appointment if necessary.  Additionally, you may be receiving a survey about your experience at our office within a few days to 1 week by e-mail or mail. We value your feedback.  Nobie Putnam, DO [place]

## 2019-09-15 ENCOUNTER — Ambulatory Visit (INDEPENDENT_AMBULATORY_CARE_PROVIDER_SITE_OTHER): Payer: 59 | Admitting: Family Medicine

## 2019-09-15 ENCOUNTER — Other Ambulatory Visit: Payer: Self-pay

## 2019-09-15 ENCOUNTER — Encounter: Payer: Self-pay | Admitting: Family Medicine

## 2019-09-15 VITALS — BP 123/87 | HR 96 | Temp 98.7°F | Resp 16 | Ht 75.0 in | Wt 224.0 lb

## 2019-09-15 DIAGNOSIS — B070 Plantar wart: Secondary | ICD-10-CM

## 2019-09-15 DIAGNOSIS — B351 Tinea unguium: Secondary | ICD-10-CM

## 2019-09-15 MED ORDER — TERBINAFINE HCL 250 MG PO TABS: 250.0000 mg | ORAL_TABLET | Freq: Every day | ORAL | 2 refills | Status: DC

## 2019-09-15 NOTE — Progress Notes (Signed)
Subjective:    Patient ID: Seth [name], male    DOB: January 23, 1961, 57 y.o.   MRN: [id number]  Seth [name] is a 58 y.o. male presenting on 09/15/2019 for nails (Fungus toe nails and others)   HPI   Onychomycosis, bilateral feet, great toes In past he had similar issue with toenail fungus thicker, treated with podiatry on oral lamisil for toenail fungus and had plantar wart, saw Dr Sharlotte Alamo at [facility]. Now he has worsening problem again after 4 years, previously treated 2016, has similar issue on toenails and has some discomfort in toes and feet related to this. He has plantar wart that was previously treated now seems returning as well. - He is requesting the oral lamisil again since it worked last time Denies any swelling, redness, drainage of pus or ulceration, fever chills  Health Maintenance: UTD Flu vaccine 09/04/19  Depression screen [facility] 2/9 03/02/2019 11/23/2018 08/31/2018  Decreased Interest 0 0 0  Down, Depressed, Hopeless 0 0 0  PHQ - 2 Score 0 0 0    Social History   Tobacco Use  . Smoking status: Current Every Day Smoker    Packs/day: 1.00    Years: 15.00    Pack years: 15.00    Types: Cigarettes  . Smokeless tobacco: Never Used  Substance Use Topics  . Alcohol use: Yes    Comment: occasionally  . Drug use: No    Review of Systems Per HPI unless specifically indicated above     Objective:    BP 123/87   Pulse 96   Temp 98.7 F (37.1 C) (Oral)   Resp 16   Ht 6\' 3"  (1.905 m)   Wt 224 lb (101.6 kg)   BMI 28.00 kg/m   Wt Readings from Last 3 Encounters:  09/15/19 224 lb (101.6 kg)  01/18/19 220 lb (99.8 kg)  01/02/19 220 lb (99.8 kg)    Physical Exam Vitals signs and nursing note reviewed.  Constitutional:      General: He is not in acute distress.    Appearance: He is well-developed. He is not diaphoretic.     Comments: Well-appearing, comfortable, cooperative  HENT:     Head: Normocephalic and atraumatic.  Eyes:     General:         Right eye: No discharge.        Left eye: No discharge.     Conjunctiva/sclera: Conjunctivae normal.  Cardiovascular:     Rate and Rhythm: Normal rate.  Pulmonary:     Effort: Pulmonary effort is normal.  Skin:    General: Skin is warm and dry.     Findings: No erythema or rash.     Comments: Bilateral feet with toenails great toes thicker and some discoloration, other areas of feet with some callus formation L toes and plantar with wart on L foot  Neurological:     Mental Status: He is alert and oriented to person, place, and time.  Psychiatric:        Behavior: Behavior normal.     Comments: Well groomed, good eye contact, normal speech and thoughts    Results for orders placed or performed in visit on 02/27/19  HgB A1c  Result Value Ref Range   Hgb A1c MFr Bld 6.4 (H) <5.7 % of total Hgb   Mean Plasma Glucose 137 (calc)   eAG (mmol/L) 7.6 (calc)      Assessment & Plan:   Problem List Items Addressed This Visit  None    Visit Diagnoses    Onychomycosis of toenail    -  Primary   Relevant Medications   terbinafine (LAMISIL) 250 MG tablet   Plantar wart of left foot       Relevant Medications   terbinafine (LAMISIL) 250 MG tablet      Previously treated 4 yr ago with oral terbinafine x 3 months, tolerated well Repeat course Terbinafine 250mg  daily x 3 months, 30 day + 2 refill ordered Reviewed future foot care and plans if need can return to [facility] ordered this encounter  Medications  . terbinafine (LAMISIL) 250 MG tablet    Sig: Take 1 tablet (250 mg total) by mouth daily. For up to 3 months    Dispense:  30 tablet    Refill:  2     Follow up plan: Return if symptoms worsen or fail to improve, for as needed, keep apt.   Nobie Putnam, DO [facility] 09/15/2019, 11:36 AM

## 2019-09-15 NOTE — Patient Instructions (Addendum)
Thank you for coming to the office today.  Terbinafine 250mg  daily for 3 months.  Follow up with Dr Cleda Mccreedy if interested - we can consider a referral.  Please schedule a Follow-up Appointment to: Return if symptoms worsen or fail to improve, for as needed, keep apt.  If you have any other questions or concerns, please feel free to call the office or send a message through [place]. You may also schedule an earlier appointment if necessary.  Additionally, you may be receiving a survey about your experience at our office within a few days to 1 week by e-mail or mail. We value your feedback.  Nobie Putnam, DO [place]

## 2019-09-18 ENCOUNTER — Other Ambulatory Visit: Payer: 59

## 2019-09-19 LAB — PSA: PSA: 0.6 ng/mL (ref <=4.0)

## 2019-09-19 LAB — CBC WITH DIFFERENTIAL/PLATELET
Absolute Monocytes: 576 cells/uL (ref 200–950)
Basophils Absolute: 126 cells/uL (ref 0–200)
Basophils Relative: 1.4 %
Eosinophils Absolute: 162 cells/uL (ref 15–500)
Eosinophils Relative: 1.8 %
HCT: 45.3 % (ref 38.5–50.0)
Hemoglobin: 15.2 g/dL (ref 13.2–17.1)
Lymphs Abs: 2646 cells/uL (ref 850–3900)
MCH: 29.6 pg (ref 27.0–33.0)
MCHC: 33.6 g/dL (ref 32.0–36.0)
MCV: 88.3 fL (ref 80.0–100.0)
MPV: 10.2 fL (ref 7.5–12.5)
Monocytes Relative: 6.4 %
Neutro Abs: 5490 cells/uL (ref 1500–7800)
Neutrophils Relative %: 61 %
Platelets: 302 10*3/uL (ref 140–400)
RBC: 5.13 10*6/uL (ref 4.20–5.80)
RDW: 12.4 % (ref 11.0–15.0)
Total Lymphocyte: 29.4 %
WBC: 9 10*3/uL (ref 3.8–10.8)

## 2019-09-19 LAB — COMPLETE METABOLIC PANEL WITH GFR
AG Ratio: 1.7 (calc) (ref 1.0–2.5)
ALT: 13 U/L (ref 9–46)
AST: 12 U/L (ref 10–35)
Albumin: 4.2 g/dL (ref 3.6–5.1)
Alkaline phosphatase (APISO): 52 U/L (ref 35–144)
BUN/Creatinine Ratio: 15 (calc) (ref 6–22)
BUN: 20 mg/dL (ref 7–25)
CO2: 29 mmol/L (ref 20–32)
Calcium: 9.6 mg/dL (ref 8.6–10.3)
Chloride: 103 mmol/L (ref 98–110)
Creat: 1.35 mg/dL — ABNORMAL HIGH (ref 0.70–1.33)
GFR, Est African American: 68 mL/min/{1.73_m2} (ref >=60)
GFR, Est Non African American: 58 mL/min/{1.73_m2} — ABNORMAL LOW (ref >=60)
Globulin: 2.5 g/dL (calc) (ref 1.9–3.7)
Glucose, Bld: 119 mg/dL — ABNORMAL HIGH (ref 65–99)
Potassium: 4.7 mmol/L (ref 3.5–5.3)
Sodium: 138 mmol/L (ref 135–146)
Total Bilirubin: 0.5 mg/dL (ref 0.2–1.2)
Total Protein: 6.7 g/dL (ref 6.1–8.1)

## 2019-09-19 LAB — LIPID PANEL
Cholesterol: 175 mg/dL (ref <200)
HDL: 30 mg/dL — ABNORMAL LOW (ref >=40)
LDL Cholesterol (Calc): 120 mg/dL (calc) — ABNORMAL HIGH
Non-HDL Cholesterol (Calc): 145 mg/dL (calc) — ABNORMAL HIGH (ref <130)
Total CHOL/HDL Ratio: 5.8 (calc) — ABNORMAL HIGH (ref <5.0)
Triglycerides: 133 mg/dL (ref <150)

## 2019-09-19 LAB — HEMOGLOBIN A1C
Hgb A1c MFr Bld: 6.3 % of total Hgb — ABNORMAL HIGH (ref <5.7)
Mean Plasma Glucose: 134 (calc)
eAG (mmol/L): 7.4 (calc)

## 2019-09-25 ENCOUNTER — Ambulatory Visit (INDEPENDENT_AMBULATORY_CARE_PROVIDER_SITE_OTHER): Payer: 59 | Admitting: Family Medicine

## 2019-09-25 ENCOUNTER — Other Ambulatory Visit: Payer: Self-pay

## 2019-09-25 ENCOUNTER — Encounter: Payer: Self-pay | Admitting: Family Medicine

## 2019-09-25 ENCOUNTER — Other Ambulatory Visit: Payer: Self-pay | Admitting: Family Medicine

## 2019-09-25 VITALS — BP 136/87 | HR 103 | Temp 98.0°F | Resp 18 | Ht 75.0 in | Wt 223.6 lb

## 2019-09-25 DIAGNOSIS — E782 Mixed hyperlipidemia: Secondary | ICD-10-CM | POA: Diagnosis not present

## 2019-09-25 DIAGNOSIS — Z0000 Encounter for general adult medical examination without abnormal findings: Secondary | ICD-10-CM

## 2019-09-25 DIAGNOSIS — R7303 Prediabetes: Secondary | ICD-10-CM

## 2019-09-25 DIAGNOSIS — J432 Centrilobular emphysema: Secondary | ICD-10-CM | POA: Diagnosis not present

## 2019-09-25 DIAGNOSIS — R7989 Other specified abnormal findings of blood chemistry: Secondary | ICD-10-CM | POA: Diagnosis not present

## 2019-09-25 NOTE — Assessment & Plan Note (Signed)
Slight improved LDL Limited lifestyle Lipids 09/2019 Elevated ASCVD as smoker  Plan: 1. Discussed ASCVD risk reduction w/ statin - declined at this time. 2. Encourage improved lifestyle - low carb/cholesterol, reduce portion size, continue improving regular exercise

## 2019-09-25 NOTE — Assessment & Plan Note (Signed)
Recent Cr 1.35 Prior trend 1.1 to 1.2 Limited hydration Encourage improve hydration Next lab 4 months repeat

## 2019-09-25 NOTE — Assessment & Plan Note (Signed)
Stable COPD without flare up exac °Active smoker, chronic tobacco abuse °Not on maintenance inhalers ° °Smoking cessation, not ready to quit °Pursue Low dose CT lung scan repeat °Follow-up in future may benefit from PFTs staging severity, maintenance therapy °

## 2019-09-25 NOTE — Patient Instructions (Addendum)
Thank you for coming to the office today.  Recent Labs    02/27/19 0854 09/18/19 0755  HGBA1C 6.4* 6.3*   Slightly improved, keep up good work, goal to get back to A1c < 6  Prostate exam was normal today.  PSA is also normal 0.6  Re-check creatinine kidney function in 4 months. Stay extra hydrated  DUE for FASTING BLOOD WORK (no food or drink after midnight before the lab appointment, only water or coffee without cream/sugar on the morning of)  SCHEDULE "Lab Only" visit in the morning at the clinic for lab draw in 4 MONTHS   - Make sure Lab Only appointment is at about 1 week before your next appointment, so that results will be available  For Lab Results, once available within 2-3 days of blood draw, you can can log in to MyChart online to view your results and a brief explanation. Also, we can discuss results at next follow-up visit.   Please schedule a Follow-up Appointment to: Return in about 4 months (around 01/23/2020) for 4 month PreDM, Creatinine follow-up lab results.  If you have any other questions or concerns, please feel free to call the office or send a message through [place]. You may also schedule an earlier appointment if necessary.  Additionally, you may be receiving a survey about your experience at our office within a few days to 1 week by e-mail or mail. We value your feedback.  Nobie Putnam, DO [place]

## 2019-09-25 NOTE — Assessment & Plan Note (Signed)
Elevated PreDM A1c slight improve from 6.4 to 6.3 Still sub optimal diet  Plan:  1. Not on any therapy currently - offer metformin low dose, declined, will focus on lifestyle first 2. Encourage improved lifestyle - low carb, low sugar diet, reduce portion size, continue improving regular exercise  F/u 4 month s

## 2019-09-25 NOTE — Progress Notes (Signed)
Subjective:    Patient ID: Seth [name], male    DOB: [birth year], 58 y.o.   MRN: [id number]  Seth [name] is a 58 y.o. male presenting on 09/25/2019 for Annual Exam   HPI   Here for Annual Physical and Lab Review.  Pre-Diabetes Recent lab shows A1c down to 6.3 from prior 6.4 He attributes to some poor dietary habits more recently with coronavirus pandemic issue with limited break at work for lunch CBGs:None Meds:Never on CurrentlyNOTACEi / ARB Lifestyle: - Diet (limits carbs starches, drinks mostly water) - Exercise (Limited regular exercise, active working as Dealer) Denies hypoglycemia  Allergic or seasonal Sinusitis Has flonase, best result  HYPERLIPIDEMIA: - Reports no concerns. Last lipid 09/2019,with abnormal results with low HDL and elevated LDL mildly -No cholesterol medicine - He is fasting today only had black coffee, ready for labs  Elevated Creatinine Lab Cr up to 1.3 Not always hydrated  COPD / TOBACCO ABUSE / Pulmonary Nodules - Active smoker, 1ppd for up to 15 years - Previously quit for 10+ years, then restarted. He quit cold [place] before. In past tried NRT with limited results - Not ready to quit - Previous dx of COPD with emphysema, without known complication. He does not use inhalers and no recent flare up - Last imaging 04/10/15 CT wo contrast chest - showed scattered pulmonary nodules, largest up to 45mm - recommended repeat CT in 2 years, he has not done this, interested to repeat  FOLLOW-UP DJD in Cervical and Lumbar Spine / Osteoarthritis in joints hands/knees etc Followed by [facility], Dr Phylliss Bob, recent Lumbar R side microdiscectomy 04/07/18, see op report in chart review. He is doing well post op overall has had some issues and still some problems with generalized aches and pains from arthritis in other joints. Back is improved but still causes some problems. - Describes chronic wear and tear from occupation and  old injuries - He has chronic intermittent pain sometimes in neck and upper extremity R > L with some paresthesia and tingling at times, only intermittent with flares - Currently taking Tramadol 50mg  TID - neck and back - Clement Sayres NP at [facility] (Previously [facility]) - for 10 years for neck - Also taking Venlafaxine XR 150mg  - 24 hr for low back pain R side upper leg at times with radiation of pain and tightening up   Health Maintenance: UTD Flu vaccine  - UTD Hep C negative 2018  -Colon CA Screening: Last Colonoscopy9/14/15(done by [facility]), results withno polyps, good for10years, due in 07/2024. Currently asymptomatic. No known family history of colon CA. - will request copy of record today  -Prostate CA Screening: DRE today. Last PSAtrend from 0.7 to 0.6. Currently asymptomatic. No known family history of prostate CA. Due for screeningPSA   Depression screen [facility] 2/9 09/25/2019 03/02/2019 11/23/2018  Decreased Interest 0 0 0  Down, Depressed, Hopeless 0 0 0  PHQ - 2 Score 0 0 0    Past Medical History:  Diagnosis Date   Degenerative disc disease, cervical    Degenerative disc disease, lumbar    GERD (gastroesophageal reflux disease)    Leg pain    Neck pain    Wears partial dentures    Past Surgical History:  Procedure Laterality Date   BLADDER SURGERY  2002   removed scar tissue   CHOLECYSTECTOMY     COLONOSCOPY     LUMBAR LAMINECTOMY/DECOMPRESSION MICRODISCECTOMY N/A 04/07/2018   Procedure: RIGHT -  SIDED LUMBAR FOUR- FIVE MICRODISECTOMY.;  Surgeon: Phylliss Bob, MD;  Location: [place];  Service: Orthopedics;  Laterality: N/A;  RIGHT - SIDED LUMBAR FOUR- FIVE MICRODISECTOMY.     MULTIPLE TOOTH EXTRACTIONS     UPPER EXTREMITY ANGIOGRAPHY Left 01/02/2019   Procedure: UPPER EXTREMITY ANGIOGRAPHY;  Surgeon: Algernon Huxley, MD;  Location: [facility];  Service: Cardiovascular;  Laterality: Left;    Social History   Socioeconomic History   Marital status: Married    Spouse name: Not on file   Number of children: Not on file   Years of education: Not on file   Highest education level: Not on file  Occupational History   Not on file  Social Needs   Financial resource strain: Not on file   Food insecurity    Worry: Not on file    Inability: Not on file   Transportation needs    Medical: Not on file    Non-medical: Not on file  Tobacco Use   Smoking status: Current Every Day Smoker    Packs/day: 1.00    Years: 15.00    Pack years: 15.00    Types: Cigarettes   Smokeless tobacco: Never Used  Substance and Sexual Activity   Alcohol use: Yes    Comment: occasionally   Drug use: No   Sexual activity: Yes  Lifestyle   Physical activity    Days per week: Not on file    Minutes per session: Not on file   Stress: Not on file  Relationships   Social connections    Talks on phone: Not on file    Gets together: Not on file    Attends religious service: Not on file    Active member of club or organization: Not on file    Attends meetings of clubs or organizations: Not on file    Relationship status: Not on file   Intimate partner violence    Fear of current or ex partner: Not on file    Emotionally abused: Not on file    Physically abused: Not on file    Forced sexual activity: Not on file  Other Topics Concern   Not on file  Social History Narrative   Not on file   Family History  Problem Relation Age of Onset   Diabetes Mother    Cancer Mother        bone   Cancer Father    Heart disease Father    Current Outpatient Medications on File Prior to Visit  Medication Sig   fluticasone (FLONASE) 50 MCG/ACT nasal spray Place 2 sprays into both nostrils daily. Use for 4-6 weeks then stop and use seasonally or as needed.   ibuprofen (ADVIL,MOTRIN) 800 MG tablet    pentoxifylline (TRENTAL) 400 MG CR tablet Take 1 tablet (400 mg total) by  mouth 3 (three) times daily with meals.   terbinafine (LAMISIL) 250 MG tablet Take 1 tablet (250 mg total) by mouth daily. For up to 3 months   traMADol (ULTRAM) 50 MG tablet TK 1 T PO QID   venlafaxine XR (EFFEXOR-XR) 150 MG 24 hr capsule TAKE 1 CAPSULE(150 MG) BY MOUTH DAILY WITH BREAKFAST   albuterol (PROVENTIL HFA;VENTOLIN HFA) 108 (90 Base) MCG/ACT inhaler Inhale 2 puffs into the lungs every 4 (four) hours as needed for wheezing or shortness of breath (cough). (Patient not taking: Reported on 09/25/2019)   aspirin EC 81 MG tablet Take 81 mg by mouth daily.   baclofen (LIORESAL)  10 MG tablet    No current facility-administered medications on file prior to visit.     Review of Systems  Constitutional: Negative for activity change, appetite change, chills, diaphoresis, fatigue and fever.  HENT: Negative for congestion and hearing loss.   Eyes: Negative for visual disturbance.  Respiratory: Negative for cough, chest tightness, shortness of breath and wheezing.   Cardiovascular: Negative for chest pain, palpitations and leg swelling.  Gastrointestinal: Negative for abdominal pain, constipation, diarrhea, nausea and vomiting.  Endocrine: Negative for cold intolerance.  Genitourinary: Negative for dysuria, frequency and hematuria.  Musculoskeletal: Negative for arthralgias and neck pain.  Skin: Negative for rash.  Allergic/Immunologic: Negative for environmental allergies.  Neurological: Negative for dizziness, weakness, light-headedness, numbness and headaches.  Hematological: Negative for adenopathy.  Psychiatric/Behavioral: Negative for behavioral problems, dysphoric mood and sleep disturbance.   Per HPI unless specifically indicated above      Objective:    BP 136/87 (BP Location: Right Arm, Patient Position: Sitting, Cuff Size: Normal)    Pulse (!) 103    Temp 98 F (36.7 C) (Oral)    Resp 18    Ht 6\' 3"  (1.905 m)    Wt 223 lb 9.6 oz (101.4 kg)    SpO2 98%    BMI 27.95  kg/m   Wt Readings from Last 3 Encounters:  09/25/19 223 lb 9.6 oz (101.4 kg)  09/15/19 224 lb (101.6 kg)  01/18/19 220 lb (99.8 kg)    Physical Exam Vitals signs and nursing note reviewed.  Constitutional:      General: He is not in acute distress.    Appearance: He is well-developed. He is not diaphoretic.     Comments: Well-appearing, comfortable, cooperative  HENT:     Head: Normocephalic and atraumatic.  Eyes:     General:        Right eye: No discharge.        Left eye: No discharge.     Conjunctiva/sclera: Conjunctivae normal.     Pupils: Pupils are equal, round, and reactive to light.  Neck:     Musculoskeletal: Normal range of motion and neck supple.     Thyroid: No thyromegaly.  Cardiovascular:     Rate and Rhythm: Normal rate and regular rhythm.     Heart sounds: Normal heart sounds. No murmur.  Pulmonary:     Effort: Pulmonary effort is normal. No respiratory distress.     Breath sounds: Normal breath sounds. No wheezing or rales.  Abdominal:     General: Bowel sounds are normal. There is no distension.     Palpations: Abdomen is soft. There is no mass.     Tenderness: There is no abdominal tenderness.  Genitourinary:    Comments: Rectal/DRE: Normal external exam without hemorrhoids fissures or abnormality. DRE with palpation of mildly enlarged prostate smooth symmetrical without nodule or tenderness. Musculoskeletal: Normal range of motion.        General: No tenderness.     Comments: Upper / Lower Extremities: - Normal muscle tone, strength bilateral upper extremities 5/5, lower extremities 5/5  Lymphadenopathy:     Cervical: No cervical adenopathy.  Skin:    General: Skin is warm and dry.     Findings: No erythema or rash.  Neurological:     Mental Status: He is alert and oriented to person, place, and time.     Comments: Distal sensation intact to light touch all extremities  Psychiatric:        Behavior: Behavior normal.  Comments: Well groomed,  good eye contact, normal speech and thoughts    Results for orders placed or performed in visit on 09/18/19  PSA  Result Value Ref Range   PSA 0.6 < OR = 4.0 ng/mL  Lipid panel  Result Value Ref Range   Cholesterol 175 <200 mg/dL   HDL 30 (L) > OR = 40 mg/dL   Triglycerides 133 <150 mg/dL   LDL Cholesterol (Calc) 120 (H) mg/dL (calc)   Total CHOL/HDL Ratio 5.8 (H) <5.0 (calc)   Non-HDL Cholesterol (Calc) 145 (H) <130 mg/dL (calc)  COMPLETE METABOLIC PANEL WITH GFR  Result Value Ref Range   Glucose, Bld 119 (H) 65 - 99 mg/dL   BUN 20 7 - 25 mg/dL   Creat 1.35 (H) 0.70 - 1.33 mg/dL   GFR, Est Non African American 58 (L) > OR = 60 mL/min/1.41m2   GFR, Est African American 68 > OR = 60 mL/min/1.60m2   BUN/Creatinine Ratio 15 6 - 22 (calc)   Sodium 138 135 - 146 mmol/L   Potassium 4.7 3.5 - 5.3 mmol/L   Chloride 103 98 - 110 mmol/L   CO2 29 20 - 32 mmol/L   Calcium 9.6 8.6 - 10.3 mg/dL   Total Protein 6.7 6.1 - 8.1 g/dL   Albumin 4.2 3.6 - 5.1 g/dL   Globulin 2.5 1.9 - 3.7 g/dL (calc)   AG Ratio 1.7 1.0 - 2.5 (calc)   Total Bilirubin 0.5 0.2 - 1.2 mg/dL   Alkaline phosphatase (APISO) 52 35 - 144 U/L   AST 12 10 - 35 U/L   ALT 13 9 - 46 U/L  CBC with Differential/Platelet  Result Value Ref Range   WBC 9.0 3.8 - 10.8 Thousand/uL   RBC 5.13 4.20 - 5.80 Million/uL   Hemoglobin 15.2 13.2 - 17.1 g/dL   HCT 45.3 38.5 - 50.0 %   MCV 88.3 80.0 - 100.0 fL   MCH 29.6 27.0 - 33.0 pg   MCHC 33.6 32.0 - 36.0 g/dL   RDW 12.4 11.0 - 15.0 %   Platelets 302 140 - 400 Thousand/uL   MPV 10.2 7.5 - 12.5 fL   Neutro Abs 5,490 1,500 - 7,800 cells/uL   Lymphs Abs 2,646 850 - 3,900 cells/uL   Absolute Monocytes 576 200 - 950 cells/uL   Eosinophils Absolute 162 15 - 500 cells/uL   Basophils Absolute 126 0 - 200 cells/uL   Neutrophils Relative % 61 %   Total Lymphocyte 29.4 %   Monocytes Relative 6.4 %   Eosinophils Relative 1.8 %   Basophils Relative 1.4 %  Hemoglobin A1c  Result Value  Ref Range   Hgb A1c MFr Bld 6.3 (H) <5.7 % of total Hgb   Mean Plasma Glucose 134 (calc)   eAG (mmol/L) 7.4 (calc)      Assessment & Plan:   Problem List Items Addressed This Visit    Pre-diabetes    Elevated PreDM A1c slight improve from 6.4 to 6.3 Still sub optimal diet  Plan:  1. Not on any therapy currently - offer metformin low dose, declined, will focus on lifestyle first 2. Encourage improved lifestyle - low carb, low sugar diet, reduce portion size, continue improving regular exercise  F/u 4 month s       Hyperlipidemia    Slight improved LDL Limited lifestyle Lipids 09/2019 Elevated ASCVD as smoker  Plan: 1. Discussed ASCVD risk reduction w/ statin - declined at this time. 2. Encourage improved lifestyle - low  carb/cholesterol, reduce portion size, continue improving regular exercise      Elevated serum creatinine    Recent Cr 1.35 Prior trend 1.1 to 1.2 Limited hydration Encourage improve hydration Next lab 4 months repeat      Centrilobular emphysema (HCC)    Stable COPD without flare up exac Active smoker, chronic tobacco abuse Not on maintenance inhalers  Smoking cessation, not ready to quit Pursue Low dose CT lung scan repeat Follow-up in future may benefit from PFTs staging severity, maintenance therapy       Other Visit Diagnoses    Annual physical exam    -  Primary      Updated Health Maintenance information Reviewed recent lab results with patient Encouraged improvement to lifestyle with diet and exercise - Goal of weight loss   No orders of the defined types were placed in this encounter.    Follow up plan: Return in about 4 months (around 01/23/2020) for 4 month PreDM, Creatinine follow-up lab results.  Future labs ordered 01/2020  Nobie Putnam, [facility] 09/25/2019, 2:24 PM

## 2020-01-25 ENCOUNTER — Encounter: Payer: Self-pay | Admitting: Family Medicine

## 2020-01-25 ENCOUNTER — Other Ambulatory Visit: Payer: Self-pay

## 2020-01-25 ENCOUNTER — Ambulatory Visit
Admission: RE | Admit: 2020-01-25 | Discharge: 2020-01-25 | Disposition: A | Discharge status: Home / Self Care | Payer: BC Managed Care – PPO | Source: Ambulatory Visit | Attending: Family Medicine | Admitting: Family Medicine

## 2020-01-25 ENCOUNTER — Ambulatory Visit: Payer: 59 | Admitting: Family Medicine

## 2020-01-25 VITALS — BP 122/92 | HR 93 | Temp 97.8°F | Resp 16 | Ht 75.0 in | Wt 223.0 lb

## 2020-01-25 DIAGNOSIS — M7989 Other specified soft tissue disorders: Secondary | ICD-10-CM

## 2020-01-25 DIAGNOSIS — R238 Other skin changes: Secondary | ICD-10-CM | POA: Diagnosis not present

## 2020-01-25 DIAGNOSIS — M79651 Pain in right thigh: Secondary | ICD-10-CM

## 2020-01-25 MED ORDER — AMOXICILLIN-POT CLAVULANATE 875-125 MG PO TABS: 1.0000 | ORAL_TABLET | Freq: Two times a day (BID) | ORAL | 0 refills | Status: DC

## 2020-01-25 NOTE — Progress Notes (Signed)
Subjective:    Patient ID: Seth [name], male    DOB: 1960/12/01, 59 y.o.   MRN: [id number]  Seth [name] is a 59 y.o. male presenting on 01/25/2020 for Leg Pain (Right side onset week)   HPI   Right Lower Thigh / Leg Swelling / Redness and Pain Recent onset within past 1 week, acute swelling pain and redness lower R inner thigh, did not notice a bite or injury or break in skin, seemed to worsen over past several days but now seems redness is better. Not spreading to lower leg or calf. He still has pain and soreness. Wanted to get it checked out today. - Prior history of vascular issue with finger with cyanosis had to get angiogram ultimately was determined okay, no other issues, has not had DVT or clot before Denies fever chills chest pain, dyspnea, drainage, oozing, numbness tingling other area of pain or swelling, injury  Depression screen [facility] 2/9 01/25/2020 09/25/2019 03/02/2019  Decreased Interest 0 0 0  Down, Depressed, Hopeless 0 0 0  PHQ - 2 Score 0 0 0    Social History   Tobacco Use  . Smoking status: Current Every Day Smoker    Packs/day: 1.00    Years: 15.00    Pack years: 15.00    Types: Cigarettes  . Smokeless tobacco: Never Used  Substance Use Topics  . Alcohol use: Yes    Comment: occasionally  . Drug use: No    Review of Systems Per HPI unless specifically indicated above     Objective:    BP (!) 122/92   Pulse 93   Temp 97.8 F (36.6 C) (Temporal)   Resp 16   Ht 6\' 3"  (1.905 m)   Wt 223 lb (101.2 kg)   BMI 27.87 kg/m   Wt Readings from Last 3 Encounters:  01/25/20 223 lb (101.2 kg)  09/25/19 223 lb 9.6 oz (101.4 kg)  09/15/19 224 lb (101.6 kg)    Physical Exam Vitals and nursing note reviewed.  Constitutional:      General: He is not in acute distress.    Appearance: He is well-developed. He is not diaphoretic.     Comments: Well-appearing, comfortable, cooperative  HENT:     Head: Normocephalic and atraumatic.  Eyes:   General:        Right eye: No discharge.        Left eye: No discharge.     Conjunctiva/sclera: Conjunctivae normal.  Neck:     Thyroid: No thyromegaly.  Cardiovascular:     Rate and Rhythm: Normal rate and regular rhythm.     Heart sounds: Normal heart sounds. No murmur.  Pulmonary:     Effort: Pulmonary effort is normal. No respiratory distress.     Breath sounds: Normal breath sounds. No wheezing or rales.  Musculoskeletal:        General: Swelling (Localized R inner lower thigh tender and red tracking linearly within this posterior region, no laceration) and tenderness present. Normal range of motion.     Cervical back: Normal range of motion and neck supple.     Right lower leg: Edema present.     Comments: No calf swelling or redness or pain, no calf asymmetry  Lymphadenopathy:     Cervical: No cervical adenopathy.  Skin:    General: Skin is warm and dry.     Findings: No erythema or rash.  Neurological:     Mental Status: He is alert and oriented  to person, place, and time.  Psychiatric:        Behavior: Behavior normal.     Comments: Well groomed, good eye contact, normal speech and thoughts    Results for orders placed or performed in visit on 09/18/19  PSA  Result Value Ref Range   PSA 0.6 < OR = 4.0 ng/mL  Lipid panel  Result Value Ref Range   Cholesterol 175 <200 mg/dL   HDL 30 (L) > OR = 40 mg/dL   Triglycerides 133 <150 mg/dL   LDL Cholesterol (Calc) 120 (H) mg/dL (calc)   Total CHOL/HDL Ratio 5.8 (H) <5.0 (calc)   Non-HDL Cholesterol (Calc) 145 (H) <130 mg/dL (calc)  COMPLETE METABOLIC PANEL WITH GFR  Result Value Ref Range   Glucose, Bld 119 (H) 65 - 99 mg/dL   BUN 20 7 - 25 mg/dL   Creat 1.35 (H) 0.70 - 1.33 mg/dL   GFR, Est Non African American 58 (L) > OR = 60 mL/min/1.66m2   GFR, Est African American 68 > OR = 60 mL/min/1.17m2   BUN/Creatinine Ratio 15 6 - 22 (calc)   Sodium 138 135 - 146 mmol/L   Potassium 4.7 3.5 - 5.3 mmol/L   Chloride 103 98  - 110 mmol/L   CO2 29 20 - 32 mmol/L   Calcium 9.6 8.6 - 10.3 mg/dL   Total Protein 6.7 6.1 - 8.1 g/dL   Albumin 4.2 3.6 - 5.1 g/dL   Globulin 2.5 1.9 - 3.7 g/dL (calc)   AG Ratio 1.7 1.0 - 2.5 (calc)   Total Bilirubin 0.5 0.2 - 1.2 mg/dL   Alkaline phosphatase (APISO) 52 35 - 144 U/L   AST 12 10 - 35 U/L   ALT 13 9 - 46 U/L  CBC with Differential/Platelet  Result Value Ref Range   WBC 9.0 3.8 - 10.8 Thousand/uL   RBC 5.13 4.20 - 5.80 Million/uL   Hemoglobin 15.2 13.2 - 17.1 g/dL   HCT 45.3 38.5 - 50.0 %   MCV 88.3 80.0 - 100.0 fL   MCH 29.6 27.0 - 33.0 pg   MCHC 33.6 32.0 - 36.0 g/dL   RDW 12.4 11.0 - 15.0 %   Platelets 302 140 - 400 Thousand/uL   MPV 10.2 7.5 - 12.5 fL   Neutro Abs 5,490 1,500 - 7,800 cells/uL   Lymphs Abs 2,646 850 - 3,900 cells/uL   Absolute Monocytes 576 200 - 950 cells/uL   Eosinophils Absolute 162 15 - 500 cells/uL   Basophils Absolute 126 0 - 200 cells/uL   Neutrophils Relative % 61 %   Total Lymphocyte 29.4 %   Monocytes Relative 6.4 %   Eosinophils Relative 1.8 %   Basophils Relative 1.4 %  Hemoglobin A1c  Result Value Ref Range   Hgb A1c MFr Bld 6.3 (H) <5.7 % of total Hgb   Mean Plasma Glucose 134 (calc)   eAG (mmol/L) 7.4 (calc)      Assessment & Plan:   Problem List Items Addressed This Visit    None    Visit Diagnoses    Acute pain of right thigh    -  Primary   Relevant Medications   amoxicillin-clavulanate (AUGMENTIN) 875-125 MG tablet   Other Relevant Orders   US Venous Img Lower Unilateral Right   Redness and swelling of thigh       Relevant Medications   amoxicillin-clavulanate (AUGMENTIN) 875-125 MG tablet   Other Relevant Orders   US Venous Img Lower Unilateral Right  Clinically suspicious localized area of lower R thigh swelling redness pain without improvement seems worse in past 1 week, without obvious provoking injury or other cause.  He is smoker and has history of cyanosis or possible berger syndrome in past  per vascular No documented history of DVT Well's Score is 1 (localized tenderness along deep vein system) = LOW RISK  Discussed differential diagnosis - concern for possible DVT or possible superficial thrombophlebitis is possibility  Does not seem consistent with bacteria abscess but cannot rule out a superficial cellulitis  Will proceed with STAT RLE Ultrasound venous to rule out DVT, scheduled for today patient will go now for scan and f/u results, will anticipate call.  He may take NSAID as needed  Empiric Augmentin BID x 10 days sent to pharmacy as back up plan to start, however now he is already scheduled for Ultrasound, we will tell him results first then if negative he can start Augmentin as a precaution against cellulitis  He can follow up with Vascular if not improving.  Return criteria given when to seek care at hospital ED immediately if severe change or worsening.   **UPDATE 12:33pm 01/25/20** He was initially scheduled for STAT US RLE today at 10:15am at [facility] however now it says it was cancelled. I called patient to check status. He said he was going to call his insurance to check and make sure authorization is done or not needed first, and then he will re-schedule the ultrasound for tomorrow Fri 3/26 most likely. I have updated our office staff as well, and will stay tuned for any updates. I advised him if we need to submit any other authorization he can let us know. Otherwise he should re-schedule as soon as he can by tomorrow.  Meds ordered this encounter  Medications  . amoxicillin-clavulanate (AUGMENTIN) 875-125 MG tablet    Sig: Take 1 tablet by mouth 2 (two) times daily. For 10 days    Dispense:  20 tablet    Refill:  0      Follow up plan: Return in about 1 week (around 02/01/2020), or if symptoms worsen or fail to improve, for leg pain swelling.   Nobie Putnam, [facility] 01/25/2020, 9:37 AM

## 2020-01-25 NOTE — Patient Instructions (Addendum)
Thank you for coming to the office today.  Start antibiotic  STAT ultrasound of Right leg / thigh  If concern for worsening symptoms or blood clot, short of breath spreading redness, chest pain, calf swelling then seek care more immediately at hospital  Please schedule a Follow-up Appointment to: Return in about 1 week (around 02/01/2020), or if symptoms worsen or fail to improve, for leg pain swelling.  If you have any other questions or concerns, please feel free to call the office or send a message through [place]. You may also schedule an earlier appointment if necessary.  Additionally, you may be receiving a survey about your experience at our office within a few days to 1 week by e-mail or mail. We value your feedback.  Nobie Putnam, DO [place]

## 2020-01-29 ENCOUNTER — Telehealth: Payer: Self-pay | Admitting: Family Medicine

## 2020-01-29 ENCOUNTER — Other Ambulatory Visit: Payer: Self-pay

## 2020-01-29 ENCOUNTER — Ambulatory Visit
Admission: RE | Admit: 2020-01-29 | Discharge: 2020-01-29 | Disposition: A | Discharge status: Home / Self Care | Payer: BC Managed Care – PPO | Source: Ambulatory Visit | Attending: Family Medicine | Admitting: Family Medicine

## 2020-01-29 DIAGNOSIS — M7989 Other specified soft tissue disorders: Secondary | ICD-10-CM | POA: Diagnosis not present

## 2020-01-29 DIAGNOSIS — R238 Other skin changes: Secondary | ICD-10-CM | POA: Diagnosis present

## 2020-01-29 DIAGNOSIS — M79651 Pain in right thigh: Secondary | ICD-10-CM | POA: Diagnosis present

## 2020-01-29 NOTE — Telephone Encounter (Signed)
Apt is scheduled at 3:15 pm on 01/29/2020 at [facility].

## 2020-01-29 NOTE — Telephone Encounter (Signed)
Pt  Is requesting a call back about his [place] [phone]

## 2020-02-14 ENCOUNTER — Other Ambulatory Visit: Payer: Self-pay | Admitting: Family Medicine

## 2020-02-14 DIAGNOSIS — G8929 Other chronic pain: Secondary | ICD-10-CM

## 2020-02-14 DIAGNOSIS — M5442 Lumbago with sciatica, left side: Secondary | ICD-10-CM

## 2020-02-14 DIAGNOSIS — J431 Panlobular emphysema: Secondary | ICD-10-CM

## 2020-02-14 NOTE — Telephone Encounter (Signed)
Requested Prescriptions  Pending Prescriptions Disp Refills  . venlafaxine XR (EFFEXOR-XR) 150 MG 24 hr capsule [Pharmacy Med Name: VENLAFAXINE 150MG  ER CAPSULES] 90 capsule 0    Sig: TAKE 1 CAPSULE(150 MG) BY MOUTH DAILY WITH BREAKFAST     Psychiatry: Antidepressants - SNRI - desvenlafaxine & venlafaxine Failed - 02/14/2020  3:20 AM      Failed - LDL in normal range and within 360 days    LDL Cholesterol (Calc)  Date Value Ref Range Status  09/18/2019 120 (H) mg/dL (calc) Final    Comment:    Reference range: <100 . Desirable range <100 mg/dL for primary prevention;   <70 mg/dL for patients with CHD or diabetic patients  with > or = 2 CHD risk factors. Marland Kitchen LDL-C is now calculated using the Martin-Hopkins  calculation, which is a validated novel method providing  better accuracy than the Friedewald equation in the  estimation of LDL-C.  Cresenciano Genre et al. Annamaria Helling. [id number]): 2061-2068  (http://education.[facility].com/faq/FAQ164)          Failed - Last BP in normal range    BP Readings from Last 1 Encounters:  01/25/20 (!) 122/92         Passed - Total Cholesterol in normal range and within 360 days    Cholesterol  Date Value Ref Range Status  09/18/2019 175 <200 mg/dL Final         Passed - Triglycerides in normal range and within 360 days    Triglycerides  Date Value Ref Range Status  09/18/2019 133 <150 mg/dL Final         Passed - Valid encounter within last 6 months    Recent Outpatient Visits          2 weeks ago Acute pain of right thigh   [place], DO   4 months ago Annual physical exam   [facility] [place], Devonne Doughty, DO   5 months ago Onychomycosis of toenail   [facility] Olin Hauser, [place]   11 months ago Pre-diabetes   [place], DO   1 year ago COPD exacerbation [facility])   [place], DO             BP elevation at last office visit probably due to hip pain, which he was being evaluated for.  LDL value is lower from previous check.

## 2020-02-27 ENCOUNTER — Telehealth: Payer: Self-pay

## 2020-02-27 DIAGNOSIS — I82811 Embolism and thrombosis of superficial veins of right lower extremities: Secondary | ICD-10-CM

## 2020-02-27 NOTE — Telephone Encounter (Signed)
Copied from [place] [phone]. Topic: General - Other >> Feb 27, 2020  8:32 AM Seth [name] wrote: Patient states pain is no longer in the knee area but is now in the lower calf area on right leg. Patient wanted to make PCP aware the pain location has moved seeking clinical advice

## 2020-02-27 NOTE — Telephone Encounter (Signed)
Patient is requesting office call [facility] and put in verbal referral for patient.  Patient states he already spoke to [facility] and said that the office could get him seen asap, just need referral. Patient call back [phone]

## 2020-02-27 NOTE — Telephone Encounter (Signed)
Patient will call us back later today hopefully with an answer from Vascular specialist and may need me to order Xarelto or referral.  ------------------------------  Called patient, last seen 3/25 for similar issue with acute Right upper thigh with localized swelling and pain.  Lower ext US Venous doppler done few days later 01/29/20 and it was negative for DVT but POSITIVE for superficial venous thrombus see report  IMPRESSION: No femoropopliteal DVT nor evidence of DVT within the visualized calf veins. Suspected superficial venous thrombus within the greater saphenous vein from approximately the proximal thigh to proximal knee.   Electronically Signed   By: Macy Mis M.D.   On: 01/29/2020 15:55  We agreed to observe and treat conservatively.  Now patient called back about 1 month later, he has had some similar symptoms R leg now into lower knee upper calf area some similar discomfort swelling concerns. He has no dyspnea or chest pain or other concerning symptoms.  I advised him, it could be extension of superficial venous thrombus.  He has seen Dr Lucky Cowboy [facility] back in 2-01/2019 for cyanosis issue.  I advised that he should call vascular to see how soon he can get worked in to see them. I am willing to offer a short term anticoagulation course with Xarelto 10mg  daily for 45 days for prophylaxis for avoiding DVT VTE in setting of superficial venous thrombus.  He will check status of apt, let us know promptly if needs urgent referral or if it will be a few days to weeks til he can get in, then he may call and request the blood thinner medicine I can send to his pharmacy.  Follow-up sooner as needed. Return precautions if severe worsening he can go to hospital ED / Urgent Care immediately if need  Nobie Putnam, [facility] 02/27/2020, 12:37 PM

## 2020-02-27 NOTE — Telephone Encounter (Signed)
Thanks will stay tuned for update from Vascular after 4/29  Nobie Putnam, [facility] 02/27/2020, 5:37 PM

## 2020-02-27 NOTE — Telephone Encounter (Signed)
Copied from [name] [phone]. Topic: General - Other >> Feb 27, 2020  8:32 AM Seth [name] wrote: Patient states pain is no longer in the knee area but is now in the lower calf area on right leg. Patient wanted to make PCP aware the pain location has moved seeking clinical advice

## 2020-02-27 NOTE — Telephone Encounter (Signed)
Apt scheduled on 02/29/2020 with [facility].

## 2020-02-27 NOTE — Telephone Encounter (Signed)
Will defer Xarelto anticoagulation for now, as it sounds like he can be seen ASAP by Vascular.  If not have apt by end of week he can contact us for rx anticoagulation or go to hospital/ urgent care.  Urgent referral placed  Reason for Referral: 3/29 US Venous Doppler R LE diagnosed with R thigh superficial venous thrombus R greater saphenous vein, treated conservatively, now 1 month later has change or worsening with lower part of R leg now upper calf and below knee swelling and pain.   Has the referral been discussed with the patient?: Yes   Designated contact for the referral if not the patient (name/phone number): patient [phone] (M)   Has the patient seen a specialist for this issue before?: Not for this issue  If so, who (practice/provider)? Dr Lucky Cowboy [facility] previously in 01/2019   Does the patient have a provider or location preference for the referral?: [facility] - return to Dr Lucky Cowboy - patient has already contacted them and they agree to see him ASAP once receive referral   Would the patient like to see previous specialist if applicable? Yes  Nobie Putnam, [facility] 02/27/2020, 2:19 PM

## 2020-02-29 ENCOUNTER — Ambulatory Visit (INDEPENDENT_AMBULATORY_CARE_PROVIDER_SITE_OTHER): Payer: BC Managed Care – PPO | Admitting: Nurse Practitioner

## 2020-02-29 ENCOUNTER — Encounter (INDEPENDENT_AMBULATORY_CARE_PROVIDER_SITE_OTHER): Payer: Self-pay | Admitting: Nurse Practitioner

## 2020-02-29 ENCOUNTER — Other Ambulatory Visit: Payer: Self-pay

## 2020-02-29 VITALS — BP 137/85 | HR 91 | Resp 16 | Wt 223.8 lb

## 2020-02-29 DIAGNOSIS — Z720 Tobacco use: Secondary | ICD-10-CM

## 2020-02-29 DIAGNOSIS — I8001 Phlebitis and thrombophlebitis of superficial vessels of right lower extremity: Secondary | ICD-10-CM

## 2020-02-29 DIAGNOSIS — I70208 Unspecified atherosclerosis of native arteries of extremities, other extremity: Secondary | ICD-10-CM

## 2020-03-04 ENCOUNTER — Encounter (INDEPENDENT_AMBULATORY_CARE_PROVIDER_SITE_OTHER): Payer: Self-pay | Admitting: Nurse Practitioner

## 2020-03-04 NOTE — Progress Notes (Signed)
Subjective:    Patient ID: Seth [name], male    DOB: 1961/06/28, 59 y.o.   MRN: [id number] Chief Complaint  Patient presents with  . Follow-up    Patient presents today after initially presenting to his PCPs office about a month or so ago with pain and swelling of his right lower extremity.  He states that his inner thigh area became hard and red and felt like it was not in place.  The feeling has since moved down to a little bit lower than his knee.  At the time the patient had an ultrasound which revealed the patient had a superficial thrombophlebitis.  Patient was not started on any anticoagulation at that time.  Today, the patient reports that the pain and hardness within the inner thigh is gone however there is still some tightness below the knee.  However it is not extremely significant.  He denies any chest pain or shortness of breath.  Denies any fever, chills, nausea, vomiting or diarrhea.  Overall the patient seems to be doing well.   Review of Systems  Cardiovascular: Positive for leg swelling.  All other systems reviewed and are negative.      Objective:   Physical Exam Vitals reviewed.  Constitutional:      Appearance: Normal appearance.  Cardiovascular:     Rate and Rhythm: Normal rate and regular rhythm.  Pulmonary:     Effort: Pulmonary effort is normal.     Breath sounds: Normal breath sounds.  Neurological:     Mental Status: He is alert and oriented to person, place, and time.  Psychiatric:        Mood and Affect: Mood normal.        Behavior: Behavior normal.        Thought Content: Thought content normal.        Judgment: Judgment normal.     BP 137/85 (BP Location: Right Arm)   Pulse 91   Resp 16   Wt 223 lb 12.8 oz (101.5 kg)   BMI 27.97 kg/m   Past Medical History:  Diagnosis Date  . Degenerative disc disease, cervical   . Degenerative disc disease, lumbar   . GERD (gastroesophageal reflux disease)   . Leg pain   . Neck pain   . Wears  partial dentures     Social History   Socioeconomic History  . Marital status: Married    Spouse name: Not on file  . Number of children: Not on file  . Years of education: Not on file  . Highest education level: Not on file  Occupational History  . Not on file  Tobacco Use  . Smoking status: Current Every Day Smoker    Packs/day: 1.00    Years: 15.00    Pack years: 15.00    Types: Cigarettes  . Smokeless tobacco: Never Used  Substance and Sexual Activity  . Alcohol use: Yes    Comment: occasionally  . Drug use: No  . Sexual activity: Yes  Other Topics Concern  . Not on file  Social History Narrative  . Not on file   Social Determinants of Health   Financial Resource Strain:   . Difficulty of Paying Living Expenses:   Food Insecurity:   . Worried About Charity fundraiser in the Last Year:   . Arboriculturist in the Last Year:   Transportation Needs:   . Film/video editor (Medical):   Marland Kitchen Lack of Transportation (Non-Medical):  Physical Activity:   . Days of Exercise per Week:   . Minutes of Exercise per Session:   Stress:   . Feeling of Stress :   Social Connections:   . Frequency of Communication with Friends and Family:   . Frequency of Social Gatherings with Friends and Family:   . Attends Religious Services:   . Active Member of Clubs or Organizations:   . Attends Archivist Meetings:   Marland Kitchen Marital Status:   Intimate Partner Violence:   . Fear of Current or Ex-Partner:   . Emotionally Abused:   Marland Kitchen Physically Abused:   . Sexually Abused:     Past Surgical History:  Procedure Laterality Date  . BLADDER SURGERY  2002   removed scar tissue  . CHOLECYSTECTOMY    . COLONOSCOPY    . LUMBAR LAMINECTOMY/DECOMPRESSION MICRODISCECTOMY N/A 04/07/2018   Procedure: RIGHT - SIDED LUMBAR FOUR- FIVE MICRODISECTOMY.;  Surgeon: Phylliss Bob, MD;  Location: [facility];  Service: Orthopedics;  Laterality: N/A;  RIGHT - SIDED LUMBAR FOUR- FIVE MICRODISECTOMY.      . MULTIPLE TOOTH EXTRACTIONS    . UPPER EXTREMITY ANGIOGRAPHY Left 01/02/2019   Procedure: UPPER EXTREMITY ANGIOGRAPHY;  Surgeon: Algernon Huxley, MD;  Location: [facility];  Service: Cardiovascular;  Laterality: Left;    Family History  Problem Relation Age of Onset  . Diabetes Mother   . Cancer Mother        bone  . Cancer Father   . Heart disease Father     No Known Allergies     Assessment & Plan:   1. Obstruction of left ulnar artery [facility]) Patient reports that his arm is doing well.  He denies any pain, numbness of his fingertips or any other issues with his left hand.  Patient will continue with routine follow-up  2. Tobacco abuse Smoking cessation was discussed for at least 3 to 5 minutes regarding smoking cessation.  3. Thrombophlebitis of superficial veins of right lower extremity The patient's thrombophlebitis was initially a month or so ago.  At this time most of the patient's symptoms have subsided.  Typically a thrombus transitions from an acute to a chronic thrombus in 6 to 8 weeks.  Since at this time the patient is having little symptoms we will not start any anticoagulation however the patient is advised to take an aspirin a day for 4 weeks.  Patient is advised to wear medical grade 1 compression stockings in addition elevate shin when possible.  We will have the patient return in about 4 weeks for a follow-up reflux study to evaluate progression of the superficial thrombophlebitis.   Current Outpatient Medications on File Prior to Visit  Medication Sig Dispense Refill  . fluticasone (FLONASE) 50 MCG/ACT nasal spray Place 2 sprays into both nostrils daily. Use for 4-6 weeks then stop and use seasonally or as needed. 16 g 3  . ibuprofen (ADVIL,MOTRIN) 800 MG tablet     . traMADol (ULTRAM) 50 MG tablet TK 1 T PO QID  0  . venlafaxine XR (EFFEXOR-XR) 150 MG 24 hr capsule TAKE 1 CAPSULE(150 MG) BY MOUTH DAILY WITH BREAKFAST 90 capsule 0  . albuterol (PROVENTIL  HFA;VENTOLIN HFA) 108 (90 Base) MCG/ACT inhaler Inhale 2 puffs into the lungs every 4 (four) hours as needed for wheezing or shortness of breath (cough). (Patient not taking: Reported on 02/29/2020) 1 Inhaler 0  . amoxicillin-clavulanate (AUGMENTIN) 875-125 MG tablet Take 1 tablet by mouth 2 (two) times daily.  For 10 days (Patient not taking: Reported on 02/29/2020) 20 tablet 0  . baclofen (LIORESAL) 10 MG tablet     . pentoxifylline (TRENTAL) 400 MG CR tablet Take 1 tablet (400 mg total) by mouth 3 (three) times daily with meals. (Patient not taking: Reported on 01/25/2020) 90 tablet 3   No current facility-administered medications on file prior to visit.    There are no Patient Instructions on file for this visit. No follow-ups on file.   Kris Hartmann, NP

## 2020-03-29 ENCOUNTER — Ambulatory Visit (INDEPENDENT_AMBULATORY_CARE_PROVIDER_SITE_OTHER): Payer: BC Managed Care – PPO | Admitting: Vascular Surgery

## 2020-03-29 ENCOUNTER — Encounter (INDEPENDENT_AMBULATORY_CARE_PROVIDER_SITE_OTHER): Payer: BC Managed Care – PPO

## 2020-04-05 ENCOUNTER — Encounter (INDEPENDENT_AMBULATORY_CARE_PROVIDER_SITE_OTHER): Payer: Self-pay | Admitting: Vascular Surgery

## 2020-04-05 ENCOUNTER — Other Ambulatory Visit: Payer: Self-pay

## 2020-04-05 ENCOUNTER — Ambulatory Visit (INDEPENDENT_AMBULATORY_CARE_PROVIDER_SITE_OTHER): Payer: BC Managed Care – PPO

## 2020-04-05 ENCOUNTER — Ambulatory Visit (INDEPENDENT_AMBULATORY_CARE_PROVIDER_SITE_OTHER): Payer: BC Managed Care – PPO | Admitting: Vascular Surgery

## 2020-04-05 VITALS — BP 128/87 | HR 74 | Resp 16 | Wt 223.4 lb

## 2020-04-05 DIAGNOSIS — I8001 Phlebitis and thrombophlebitis of superficial vessels of right lower extremity: Secondary | ICD-10-CM | POA: Diagnosis not present

## 2020-04-05 DIAGNOSIS — I70208 Unspecified atherosclerosis of native arteries of extremities, other extremity: Secondary | ICD-10-CM | POA: Diagnosis not present

## 2020-04-05 DIAGNOSIS — I809 Phlebitis and thrombophlebitis of unspecified site: Secondary | ICD-10-CM | POA: Insufficient documentation

## 2020-04-05 NOTE — Assessment & Plan Note (Signed)
Status post angioplasty about a year ago with marked improvement in his symptoms.  Should be on antiplatelet therapy for life at this point.  Has been intermittently taking aspirin.

## 2020-04-05 NOTE — Assessment & Plan Note (Signed)
Duplex today shows continued superficial thrombophlebitis from the proximal calf to the proximal thigh but not approaching the saphenofemoral junction.  No obvious reflux was seen in the right lower extremity.  No DVT was seen.  At this point, he needs to take his aspirin every day and not intermittently. Recheck in 3 months.

## 2020-04-05 NOTE — Progress Notes (Signed)
MRN : [id number]  Seth [name] is a 59 y.o. (10-22-1961) male who presents with chief complaint of  Chief Complaint  Patient presents with  . Follow-up    ultrasound follow up  .  History of Present Illness: Patient returns today in follow up of his right leg superficial thrombophlebitis.  He still has a tender cord on the medial aspect of his thigh.  Duplex today shows continued superficial thrombophlebitis from the proximal calf to the proximal thigh but not approaching the saphenofemoral junction.  No obvious reflux was seen in the right lower extremity.  No DVT was seen. He is also a little over a year status post ulnar artery angioplasty for ischemia of the left hand.  His hand has done quite well and never required any further surgery or intervention.  Current Outpatient Medications  Medication Sig Dispense Refill  . fluticasone (FLONASE) 50 MCG/ACT nasal spray Place 2 sprays into both nostrils daily. Use for 4-6 weeks then stop and use seasonally or as needed. 16 g 3  . ibuprofen (ADVIL,MOTRIN) 800 MG tablet     . traMADol (ULTRAM) 50 MG tablet TK 1 T PO QID  0  . venlafaxine XR (EFFEXOR-XR) 150 MG 24 hr capsule TAKE 1 CAPSULE(150 MG) BY MOUTH DAILY WITH BREAKFAST 90 capsule 0  . albuterol (PROVENTIL HFA;VENTOLIN HFA) 108 (90 Base) MCG/ACT inhaler Inhale 2 puffs into the lungs every 4 (four) hours as needed for wheezing or shortness of breath (cough). (Patient not taking: Reported on 02/29/2020) 1 Inhaler 0  . amoxicillin-clavulanate (AUGMENTIN) 875-125 MG tablet Take 1 tablet by mouth 2 (two) times daily. For 10 days (Patient not taking: Reported on 02/29/2020) 20 tablet 0  . baclofen (LIORESAL) 10 MG tablet     . pentoxifylline (TRENTAL) 400 MG CR tablet Take 1 tablet (400 mg total) by mouth 3 (three) times daily with meals. (Patient not taking: Reported on 01/25/2020) 90 tablet 3   No current facility-administered medications for this visit.    Past Medical History:    Diagnosis Date  . Degenerative disc disease, cervical   . Degenerative disc disease, lumbar   . GERD (gastroesophageal reflux disease)   . Leg pain   . Neck pain   . Wears partial dentures     Past Surgical History:  Procedure Laterality Date  . BLADDER SURGERY  2002   removed scar tissue  . CHOLECYSTECTOMY    . COLONOSCOPY    . LUMBAR LAMINECTOMY/DECOMPRESSION MICRODISCECTOMY N/A 04/07/2018   Procedure: RIGHT - SIDED LUMBAR FOUR- FIVE MICRODISECTOMY.;  Surgeon: Phylliss Bob, MD;  Location: [place];  Service: Orthopedics;  Laterality: N/A;  RIGHT - SIDED LUMBAR FOUR- FIVE MICRODISECTOMY.    . MULTIPLE TOOTH EXTRACTIONS    . UPPER EXTREMITY ANGIOGRAPHY Left 01/02/2019   Procedure: UPPER EXTREMITY ANGIOGRAPHY;  Surgeon: Algernon Huxley, MD;  Location: [facility];  Service: Cardiovascular;  Laterality: Left;     Social History   Tobacco Use  . Smoking status: Current Every Day Smoker    Packs/day: 1.00    Years: 15.00    Pack years: 15.00    Types: Cigarettes  . Smokeless tobacco: Never Used  Substance Use Topics  . Alcohol use: Yes    Comment: occasionally  . Drug use: No      Family History  Problem Relation Age of Onset  . Diabetes Mother   . Cancer Mother        bone  . Cancer Father   .  Heart disease Father      No Known Allergies   REVIEW OF SYSTEMS (Negative unless checked)  Constitutional: [] Weight loss  [] Fever  [] Chills Cardiac: [] Chest pain   [] Chest pressure   [] Palpitations   [] Shortness of breath when laying flat   [] Shortness of breath at rest   [] Shortness of breath with exertion. Vascular:  [x] Pain in legs with walking   [x] Pain in legs at rest   [] Pain in legs when laying flat   [] Claudication   [] Pain in feet when walking  [] Pain in feet at rest  [] Pain in feet when laying flat   [] History of DVT   [x] Phlebitis   [x] Swelling in legs   [] Varicose veins   [] Non-healing ulcers Pulmonary:   [] Uses home oxygen   [] Productive cough    [] Hemoptysis   [] Wheeze  [] COPD   [] Asthma Neurologic:  [] Dizziness  [] Blackouts   [] Seizures   [] History of stroke   [] History of TIA  [] Aphasia   [] Temporary blindness   [] Dysphagia   [] Weakness or numbness in arms   [] Weakness or numbness in legs Musculoskeletal:  [x] Arthritis   [] Joint swelling   [x] Joint pain   [] Low back pain Hematologic:  [] Easy bruising  [] Easy bleeding   [] Hypercoagulable state   [] Anemic   Gastrointestinal:  [] Blood in stool   [] Vomiting blood  [] Gastroesophageal reflux/heartburn   [] Abdominal pain Genitourinary:  [] Chronic kidney disease   [] Difficult urination  [] Frequent urination  [] Burning with urination   [] Hematuria Skin:  [] Rashes   [] Ulcers   [] Wounds Psychological:  [] History of anxiety   []  History of major depression.  Physical Examination  BP 128/87 (BP Location: Right Arm)   Pulse 74   Resp 16   Wt 223 lb 6.4 oz (101.3 kg)   BMI 27.92 kg/m  Gen:  WD/WN, NAD Head: [place]/AT, No temporalis wasting. Ear/Nose/Throat: Hearing grossly intact, nares w/o erythema or drainage Eyes: Conjunctiva clear. Sclera non-icteric Neck: Supple.  Trachea midline Pulmonary:  Good air movement, no use of accessory muscles.  Cardiac: RRR, no JVD Vascular:  Vessel Right Left  Radial Palpable Palpable                   Musculoskeletal: M/S 5/5 throughout.  No deformity or atrophy.  Cord is present in the right medial leg consistent with superficial thrombophlebitis of the right great saphenous vein.  Trace right lower extremity edema. Neurologic: Sensation grossly intact in extremities.  Symmetrical.  Speech is fluent.  Psychiatric: Judgment intact, Mood & affect appropriate for pt's clinical situation. Dermatologic: No rashes or ulcers noted.  No cellulitis or open wounds.       Labs No results found for this or any previous visit (from the past 2160 hour(s)).  Radiology No results found.  Assessment/Plan  Obstruction of left ulnar artery (HCC) Status  post angioplasty about a year ago with marked improvement in his symptoms.  Should be on antiplatelet therapy for life at this point.  Has been intermittently taking aspirin.  Superficial thrombophlebitis Duplex today shows continued superficial thrombophlebitis from the proximal calf to the proximal thigh but not approaching the saphenofemoral junction.  No obvious reflux was seen in the right lower extremity.  No DVT was seen.  At this point, he needs to take his aspirin every day and not intermittently. Recheck in 3 months.    Leotis Pain, MD  04/05/2020 9:10 AM    This note was created with Dragon medical transcription system.  Any errors from dictation  are purely unintentional

## 2020-05-27 ENCOUNTER — Other Ambulatory Visit: Payer: Self-pay | Admitting: Family Medicine

## 2020-05-27 DIAGNOSIS — J431 Panlobular emphysema: Secondary | ICD-10-CM

## 2020-05-27 DIAGNOSIS — M5442 Lumbago with sciatica, left side: Secondary | ICD-10-CM

## 2020-05-27 DIAGNOSIS — G8929 Other chronic pain: Secondary | ICD-10-CM

## 2020-05-27 MED ORDER — VENLAFAXINE HCL ER 150 MG PO CP24: ORAL_CAPSULE | ORAL | 0 refills | Status: DC

## 2020-05-27 NOTE — Telephone Encounter (Signed)
Requested Prescriptions  Pending Prescriptions Disp Refills   venlafaxine XR (EFFEXOR-XR) 150 MG 24 hr capsule 90 capsule 0    Sig: TAKE 1 CAPSULE(150 MG) BY MOUTH DAILY WITH BREAKFAST     Psychiatry: Antidepressants - SNRI - desvenlafaxine & venlafaxine Failed - 05/27/2020  8:38 AM      Failed - LDL in normal range and within 360 days    LDL Cholesterol (Calc)  Date Value Ref Range Status  09/18/2019 120 (H) mg/dL (calc) Final    Comment:    Reference range: <100 . Desirable range <100 mg/dL for primary prevention;   <70 mg/dL for patients with CHD or diabetic patients  with > or = 2 CHD risk factors. Marland Kitchen LDL-C is now calculated using the Martin-Hopkins  calculation, which is a validated novel method providing  better accuracy than the Friedewald equation in the  estimation of LDL-C.  Cresenciano Genre et al. Annamaria Helling. 7342;876(81): 2061-2068  (http://education.[facility].com/faq/FAQ164)          Passed - Total Cholesterol in normal range and within 360 days    Cholesterol  Date Value Ref Range Status  09/18/2019 175 <200 mg/dL Final         Passed - Triglycerides in normal range and within 360 days    Triglycerides  Date Value Ref Range Status  09/18/2019 133 <150 mg/dL Final         Passed - Last BP in normal range    BP Readings from Last 1 Encounters:  04/05/20 128/87         Passed - Valid encounter within last 6 months    Recent Outpatient Visits          4 months ago Acute pain of right thigh   [place], DO   8 months ago Annual physical exam   [facility] Olin Hauser, DO   8 months ago Onychomycosis of toenail   [place], Devonne Doughty, DO   1 year ago [place], DO   1 year ago COPD exacerbation [facility])   [place], Devonne Doughty, DO

## 2020-05-27 NOTE — Telephone Encounter (Signed)
Patient requesting venlafaxine XR (EFFEXOR-XR) 150 MG 24 hr capsule, informed patient please allow 48 to 72 hour turn around time  [place], [place] [facility] [place] Phone:  [phone]  Fax:  [phone]

## 2020-06-09 IMAGING — US US EXTREM LOW VENOUS*R*
1 series · 13 of 24 positions shown · non-contrast
Comparison: None.

CLINICAL DATA: Redness and swelling, pain of right thigh

EXAM:
RIGHT LOWER EXTREMITY VENOUS DOPPLER ULTRASOUND
TECHNIQUE: Gray-scale sonography with compression, as well as color and duplex
ultrasound, were performed to evaluate the deep venous system(s)
from the level of the common femoral vein through the popliteal and
proximal calf veins.

[Series 1: us extrem low venous*right* · 0.09mm/px · 13 of 48 slices shown]
[im 1/48]
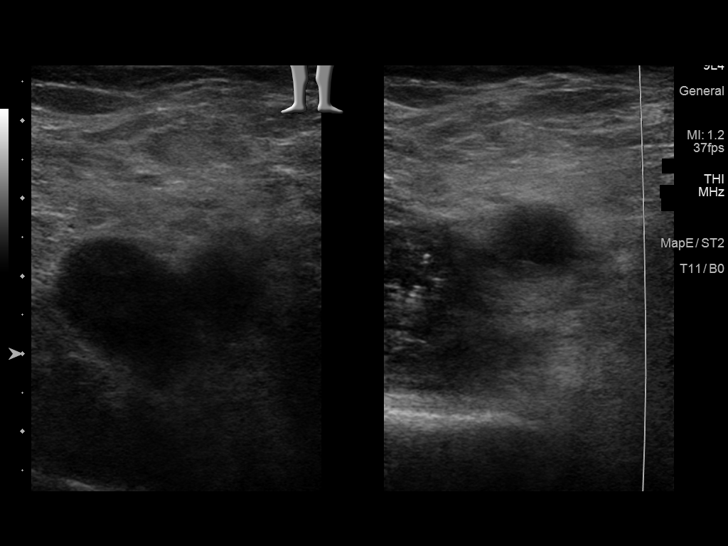
[im 5/48]
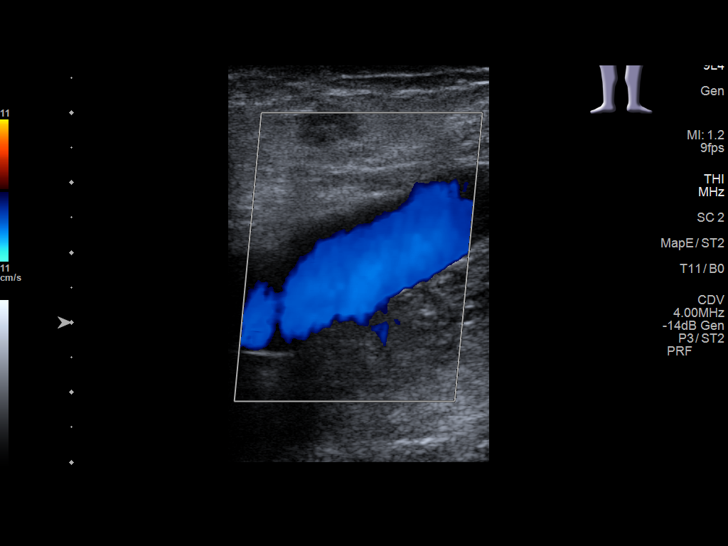
[im 9/48]
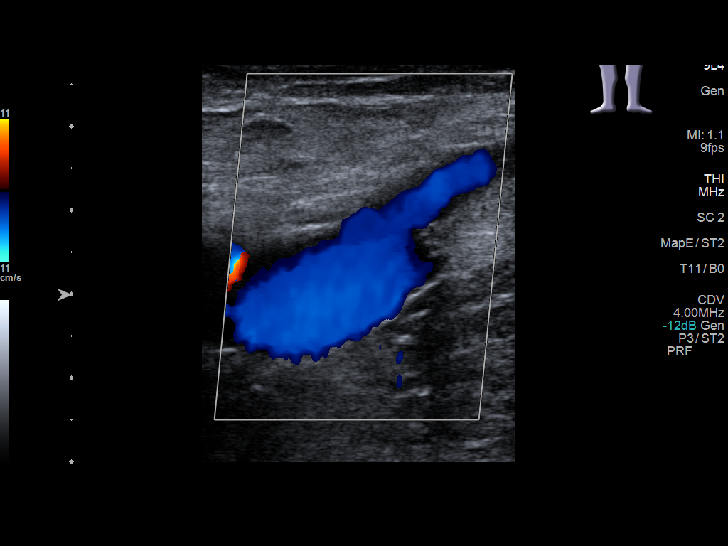
[im 13/48]
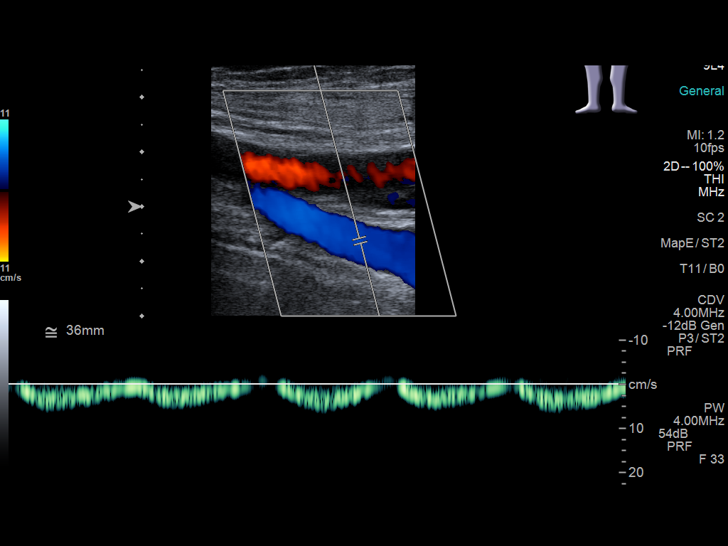
[im 17/48]
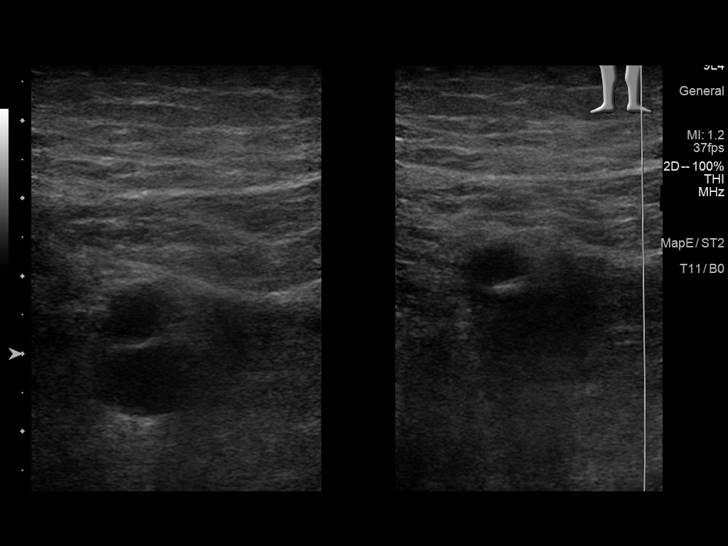
[im 21/48]
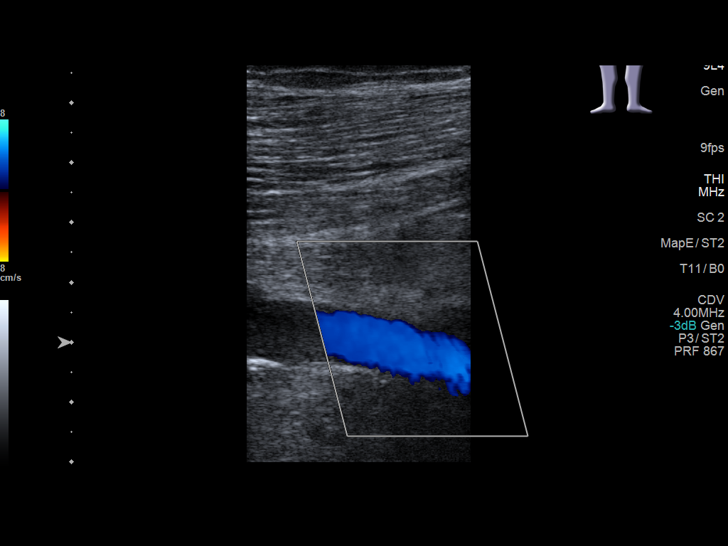
[im 25/48]
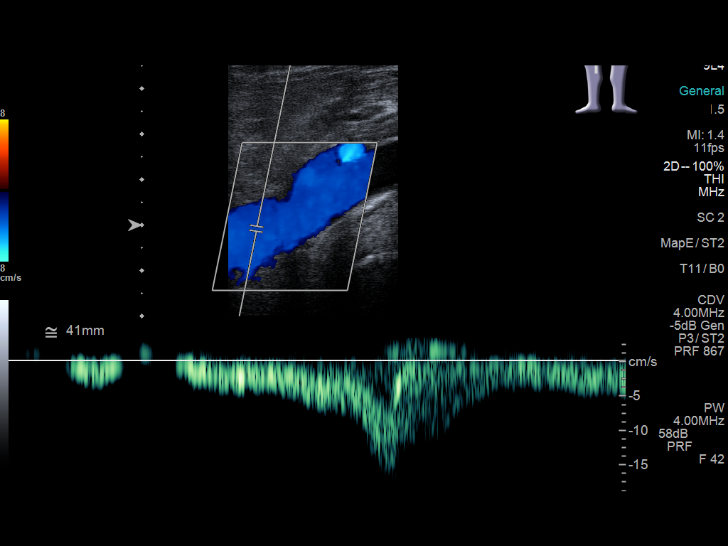
[im 27/48]
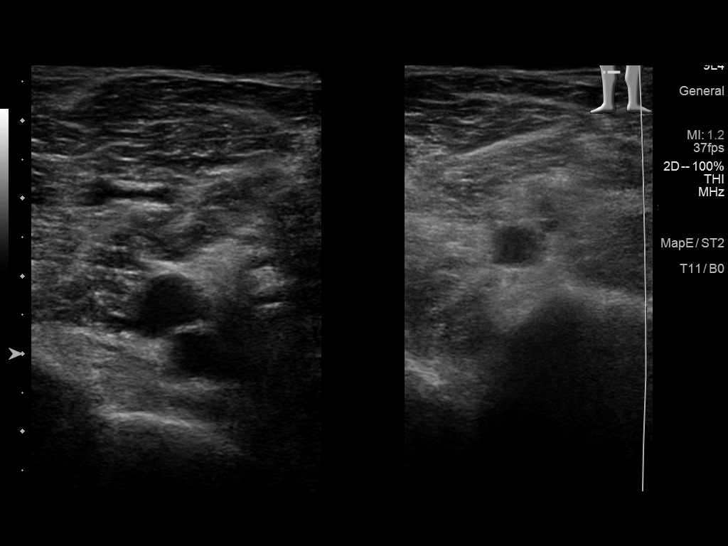
[im 31/48]
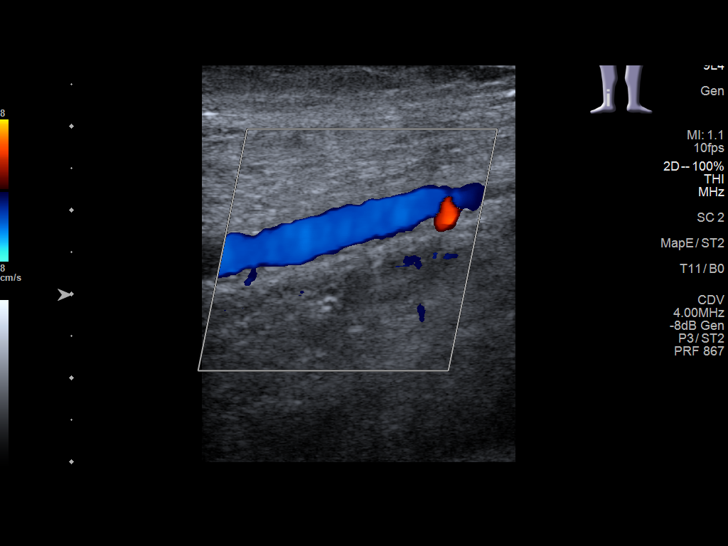
[im 35/48]
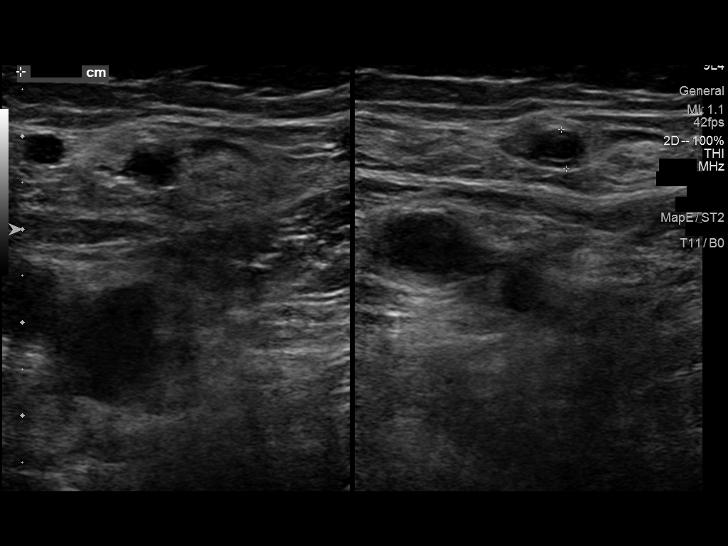
[im 39/48]
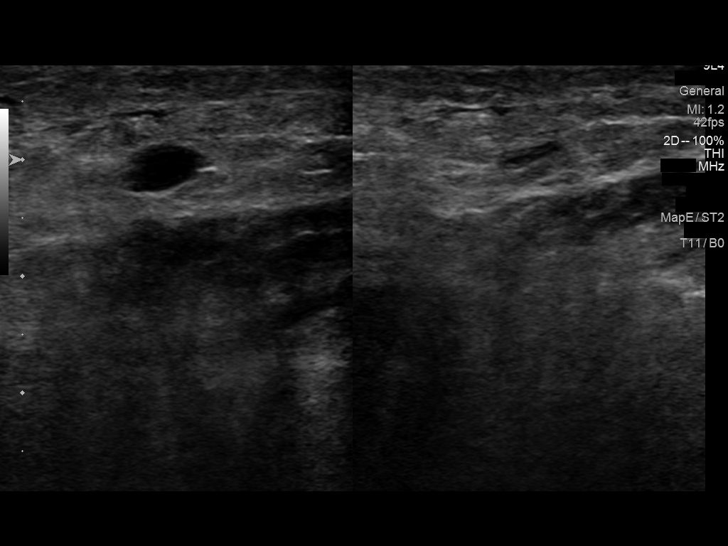
[im 43/48]
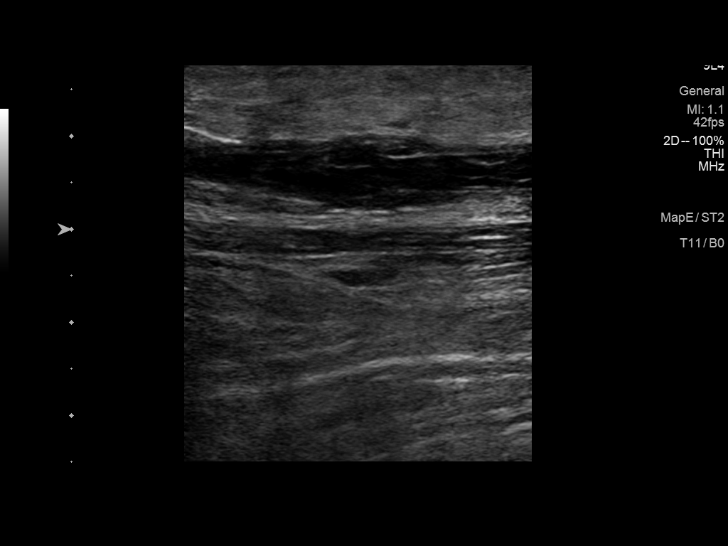
[im 48/48]
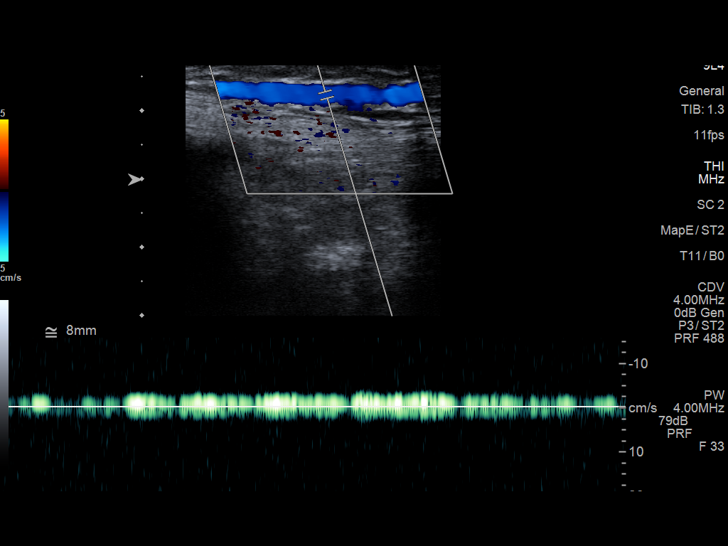

[13 of 24 positions shown; findings below may reference images not displayed]

FINDINGS: VENOUS

Normal compressibility of the common femoral, superficial femoral,
and popliteal veins, as well as the visualized calf veins.
Visualized portions of profunda femoral vein unremarkable. No
filling defects to suggest DVT on grayscale or color Doppler
imaging. Doppler waveforms show normal direction of venous flow,
normal respiratory phasicity and response to augmentation.

Limited views of the contralateral common femoral vein are
unremarkable.

Suspected thrombus within the visualized portions of the greater
saphenous vein from the proximal thigh to proximal knee.

OTHER

None.

Limitations: none
IMPRESSION: No femoropopliteal DVT nor evidence of DVT within the visualized
calf veins. Suspected superficial venous thrombus within the greater
saphenous vein from approximately the proximal thigh to proximal
knee.

## 2020-07-05 ENCOUNTER — Encounter (INDEPENDENT_AMBULATORY_CARE_PROVIDER_SITE_OTHER): Payer: BC Managed Care – PPO

## 2020-07-05 ENCOUNTER — Ambulatory Visit (INDEPENDENT_AMBULATORY_CARE_PROVIDER_SITE_OTHER): Payer: BC Managed Care – PPO | Admitting: Vascular Surgery

## 2020-07-22 ENCOUNTER — Other Ambulatory Visit: Payer: Self-pay

## 2020-07-22 ENCOUNTER — Ambulatory Visit (INDEPENDENT_AMBULATORY_CARE_PROVIDER_SITE_OTHER): Payer: BC Managed Care – PPO

## 2020-07-22 ENCOUNTER — Ambulatory Visit (INDEPENDENT_AMBULATORY_CARE_PROVIDER_SITE_OTHER): Payer: BC Managed Care – PPO | Admitting: Nurse Practitioner

## 2020-07-22 ENCOUNTER — Encounter (INDEPENDENT_AMBULATORY_CARE_PROVIDER_SITE_OTHER): Payer: Self-pay | Admitting: Nurse Practitioner

## 2020-07-22 VITALS — BP 142/91 | HR 81 | Resp 16 | Wt 220.8 lb

## 2020-07-22 DIAGNOSIS — I8001 Phlebitis and thrombophlebitis of superficial vessels of right lower extremity: Secondary | ICD-10-CM | POA: Diagnosis not present

## 2020-07-22 DIAGNOSIS — E782 Mixed hyperlipidemia: Secondary | ICD-10-CM | POA: Diagnosis not present

## 2020-07-22 DIAGNOSIS — S55002D Unspecified injury of ulnar artery at forearm level, left arm, subsequent encounter: Secondary | ICD-10-CM | POA: Diagnosis not present

## 2020-07-29 ENCOUNTER — Encounter (INDEPENDENT_AMBULATORY_CARE_PROVIDER_SITE_OTHER): Payer: Self-pay | Admitting: Nurse Practitioner

## 2020-07-29 NOTE — Progress Notes (Signed)
Subjective:    Patient ID: Seth [name], male    DOB: 11-10-1960, 59 y.o.   MRN: [id number] Chief Complaint  Patient presents with  . Follow-up    ultrasound follow up    Patient returns today for follow-up regarding his right thrombophlebitis.  The patient notes that the tenderness and discomfort he had on his right medial thigh is resolved at this time.  The patient has also had a previous ulnar artery angioplasty following ischemia of the left upper extremity.  The patient notes that his hand is well without any evidence of issue of worsening ischemia.  He denies any excessive swelling of his lower extremity.  Today noninvasive studies show a chronic superficial vein thrombus in the right great saphenous vein.  These are unchanged from the previous exam.  There is chronic thrombophlebitis extends from the right great saphenous vein in the midportion to the calf.  No evidence of DVT seen.      Review of Systems  All other systems reviewed and are negative.      Objective:   Physical Exam Vitals reviewed.  HENT:     Head: Normocephalic.  Cardiovascular:     Rate and Rhythm: Normal rate and regular rhythm.     Pulses: Normal pulses.          Radial pulses are 2+ on the left side.  Pulmonary:     Effort: Pulmonary effort is normal.  Neurological:     Mental Status: He is alert and oriented to person, place, and time.  Psychiatric:        Mood and Affect: Mood normal.        Behavior: Behavior normal.        Thought Content: Thought content normal.        Judgment: Judgment normal.     BP (!) 142/91 (BP Location: Right Arm)   Pulse 81   Resp 16   Wt 220 lb 12.8 oz (100.2 kg)   BMI 27.60 kg/m   Past Medical History:  Diagnosis Date  . Degenerative disc disease, cervical   . Degenerative disc disease, lumbar   . GERD (gastroesophageal reflux disease)   . Leg pain   . Neck pain   . Wears partial dentures     Social History   Socioeconomic History  .  Marital status: Married    Spouse name: Not on file  . Number of children: Not on file  . Years of education: Not on file  . Highest education level: Not on file  Occupational History  . Not on file  Tobacco Use  . Smoking status: Current Every Day Smoker    Packs/day: 1.00    Years: 15.00    Pack years: 15.00    Types: Cigarettes  . Smokeless tobacco: Never Used  Vaping Use  . Vaping Use: Former  Substance and Sexual Activity  . Alcohol use: Yes    Comment: occasionally  . Drug use: No  . Sexual activity: Yes  Other Topics Concern  . Not on file  Social History Narrative  . Not on file   Social Determinants of Health   Financial Resource Strain:   . Difficulty of Paying Living Expenses: Not on file  Food Insecurity:   . Worried About Charity fundraiser in the Last Year: Not on file  . Ran Out of Food in the Last Year: Not on file  Transportation Needs:   . Lack of Transportation (Medical): Not on  file  . Lack of Transportation (Non-Medical): Not on file  Physical Activity:   . Days of Exercise per Week: Not on file  . Minutes of Exercise per Session: Not on file  Stress:   . Feeling of Stress : Not on file  Social Connections:   . Frequency of Communication with Friends and Family: Not on file  . Frequency of Social Gatherings with Friends and Family: Not on file  . Attends Religious Services: Not on file  . Active Member of Clubs or Organizations: Not on file  . Attends Archivist Meetings: Not on file  . Marital Status: Not on file  Intimate Partner Violence:   . Fear of Current or Ex-Partner: Not on file  . Emotionally Abused: Not on file  . Physically Abused: Not on file  . Sexually Abused: Not on file    Past Surgical History:  Procedure Laterality Date  . BLADDER SURGERY  2002   removed scar tissue  . CHOLECYSTECTOMY    . COLONOSCOPY    . LUMBAR LAMINECTOMY/DECOMPRESSION MICRODISCECTOMY N/A 04/07/2018   Procedure: RIGHT - SIDED LUMBAR  FOUR- FIVE MICRODISECTOMY.;  Surgeon: Phylliss Bob, MD;  Location: [facility];  Service: Orthopedics;  Laterality: N/A;  RIGHT - SIDED LUMBAR FOUR- FIVE MICRODISECTOMY.    . MULTIPLE TOOTH EXTRACTIONS    . UPPER EXTREMITY ANGIOGRAPHY Left 01/02/2019   Procedure: UPPER EXTREMITY ANGIOGRAPHY;  Surgeon: Algernon Huxley, MD;  Location: [facility];  Service: Cardiovascular;  Laterality: Left;    Family History  Problem Relation Age of Onset  . Diabetes Mother   . Cancer Mother        bone  . Cancer Father   . Heart disease Father     No Known Allergies     Assessment & Plan:   1. Thrombophlebitis of superficial veins of right lower extremity This has mostly resolved at this time.  The patient denies any further extensive pain or discomfort.  Patient is advised to continue with utilizing medical grade 1 compression stockings.  Patient should contact the office if he has recurrent superficial thrombophlebitis or concern for possible DVT.  2. Injury of left ulnar artery, subsequent encounter Denies any further issues of his left upper extremity.  Patient is advised to contact the office if he begins to experience signs symptoms of steal syndrome, pain or discoloration of the upper extremity.  3. Mixed hyperlipidemia Currently controlled diet and exercise, will continue to follow with PCP for management.   Current Outpatient Medications on File Prior to Visit  Medication Sig Dispense Refill  . baclofen (LIORESAL) 10 MG tablet     . fluticasone (FLONASE) 50 MCG/ACT nasal spray Place 2 sprays into both nostrils daily. Use for 4-6 weeks then stop and use seasonally or as needed. 16 g 3  . ibuprofen (ADVIL,MOTRIN) 800 MG tablet     . traMADol (ULTRAM) 50 MG tablet TK 1 T PO QID  0  . venlafaxine XR (EFFEXOR-XR) 150 MG 24 hr capsule TAKE 1 CAPSULE(150 MG) BY MOUTH DAILY WITH BREAKFAST 90 capsule 0  . albuterol (PROVENTIL HFA;VENTOLIN HFA) 108 (90 Base) MCG/ACT inhaler Inhale 2 puffs into the  lungs every 4 (four) hours as needed for wheezing or shortness of breath (cough). (Patient not taking: Reported on 02/29/2020) 1 Inhaler 0  . amoxicillin-clavulanate (AUGMENTIN) 875-125 MG tablet Take 1 tablet by mouth 2 (two) times daily. For 10 days (Patient not taking: Reported on 02/29/2020) 20 tablet 0  .  pentoxifylline (TRENTAL) 400 MG CR tablet Take 1 tablet (400 mg total) by mouth 3 (three) times daily with meals. (Patient not taking: Reported on 01/25/2020) 90 tablet 3   No current facility-administered medications on file prior to visit.    There are no Patient Instructions on file for this visit. No follow-ups on file.   Kris Hartmann, NP

## 2020-09-04 ENCOUNTER — Other Ambulatory Visit: Payer: Self-pay | Admitting: Family Medicine

## 2020-09-04 DIAGNOSIS — J431 Panlobular emphysema: Secondary | ICD-10-CM

## 2020-09-04 DIAGNOSIS — M5442 Lumbago with sciatica, left side: Secondary | ICD-10-CM

## 2020-09-04 DIAGNOSIS — G8929 Other chronic pain: Secondary | ICD-10-CM

## 2020-09-04 MED ORDER — VENLAFAXINE HCL ER 150 MG PO CP24: ORAL_CAPSULE | ORAL | 0 refills | Status: DC

## 2020-09-04 NOTE — Telephone Encounter (Signed)
Attempted to call patient to schedule follow up appointment- left message to call office. Courtesy RF #[phone] given

## 2020-09-04 NOTE — Telephone Encounter (Signed)
Patient requesting venlafaxine XR (EFFEXOR-XR) 150 MG 24 hr capsule , informed please allow 48 to 72 hour turn around time  [place], [address] Phone:  [phone]  Fax:  [phone]

## 2020-10-09 ENCOUNTER — Other Ambulatory Visit: Payer: Self-pay | Admitting: Family Medicine

## 2020-10-09 ENCOUNTER — Encounter: Payer: Self-pay | Admitting: Family Medicine

## 2020-10-09 ENCOUNTER — Ambulatory Visit (INDEPENDENT_AMBULATORY_CARE_PROVIDER_SITE_OTHER): Payer: BC Managed Care – PPO | Admitting: Family Medicine

## 2020-10-09 ENCOUNTER — Other Ambulatory Visit: Payer: Self-pay

## 2020-10-09 VITALS — BP 140/79 | HR 99 | Temp 98.2°F | Resp 16 | Ht 75.0 in | Wt 219.0 lb

## 2020-10-09 DIAGNOSIS — G8929 Other chronic pain: Secondary | ICD-10-CM

## 2020-10-09 DIAGNOSIS — J432 Centrilobular emphysema: Secondary | ICD-10-CM

## 2020-10-09 DIAGNOSIS — R7303 Prediabetes: Secondary | ICD-10-CM

## 2020-10-09 DIAGNOSIS — Z0000 Encounter for general adult medical examination without abnormal findings: Secondary | ICD-10-CM

## 2020-10-09 DIAGNOSIS — R252 Cramp and spasm: Secondary | ICD-10-CM

## 2020-10-09 DIAGNOSIS — M5441 Lumbago with sciatica, right side: Secondary | ICD-10-CM

## 2020-10-09 DIAGNOSIS — R351 Nocturia: Secondary | ICD-10-CM

## 2020-10-09 DIAGNOSIS — M5442 Lumbago with sciatica, left side: Secondary | ICD-10-CM | POA: Diagnosis not present

## 2020-10-09 DIAGNOSIS — E782 Mixed hyperlipidemia: Secondary | ICD-10-CM

## 2020-10-09 MED ORDER — VENLAFAXINE HCL ER 150 MG PO CP24: 150.0000 mg | ORAL_CAPSULE | Freq: Every day | ORAL | 3 refills | Status: DC

## 2020-10-09 NOTE — Patient Instructions (Addendum)
Thank you for coming to the office today.  Leg cramps - Try spoonful of yellow mustard to relieve leg cramps or try daily to prevent the problem  - OTC natural option is Hyland's Leg Cramps (Dissolving tablet) take as needed for muscle cramps  Keep in mind that more muscle fatigue and strain with less hydration will trigger more cramps.  Limit coffee caffeine as well and drink more water!  In future we can check potassium and magnesium lab.  Upcoming fasting lab for blood work and physical   Please schedule a Follow-up Appointment to: Return if symptoms worsen or fail to improve, for Upcoming 1-3 months when ready for fasting lab then 1 week later Annual Physical.  If you have any other questions or concerns, please feel free to call the office or send a message through [facility]. You may also schedule an earlier appointment if necessary.  Additionally, you may be receiving a survey about your experience at our office within a few days to 1 week by e-mail or mail. We value your feedback.  Nobie Putnam, DO [place]

## 2020-10-09 NOTE — Progress Notes (Signed)
Subjective:    Patient ID: Seth [name], male    DOB: 08/03/1961, 59 y.o.   MRN: [id number]  Seth [name] is a 59 y.o. male presenting on 10/09/2020 for Leg Pain (getting cramps both side from couple of months only at night) and Back Pain (meds refill for Effexor)   HPI  FOLLOW-UPDJD in Cervical and Lumbar Spine / Osteoarthritis in joints hands/knees etc Followed by [facility], Dr Phylliss Bob, recent Lumbar R side microdiscectomy 04/07/18, see op report in chart review.  He is followed by Ortho still with spinal ESI injections Also followed by - Clement Sayres NP [facility] ([facility]) - for 10 years for neck - he is taking Tramadol 50mg  TID for this. - He is on Venlafaxine XR 150mg  daily central chronic / nerve pain with good results. Needs refill today - Describes chronic wear and tear from occupation and old injuries - He has chronic intermittent pain sometimes in neck and upper extremity R > L with some paresthesia and tingling at times, only intermittent with flares  Additionally with bilateral muscle / leg cramping, worse only at night. Often if he moves can trigger, can be cramps, right or left, also some R inner thigh area can trigger. It can be random or sporadic, not every night.   Depression screen [facility] 2/9 10/09/2020 01/25/2020 09/25/2019  Decreased Interest 0 0 0  Down, Depressed, Hopeless 0 0 0  PHQ - 2 Score 0 0 0    Social History   Tobacco Use  . Smoking status: Current Every Day Smoker    Packs/day: 1.00    Years: 15.00    Pack years: 15.00    Types: Cigarettes  . Smokeless tobacco: Never Used  Vaping Use  . Vaping Use: Former  Substance Use Topics  . Alcohol use: Yes    Comment: occasionally  . Drug use: No    Review of Systems Per HPI unless specifically indicated above     Objective:    BP 140/79   Pulse 99   Temp 98.2 F (36.8 C) (Temporal)   Resp 16   Ht 6\' 3"  (1.905 m)   Wt 219 lb  (99.3 kg)   SpO2 96%   BMI 27.37 kg/m   Wt Readings from Last 3 Encounters:  10/09/20 219 lb (99.3 kg)  07/22/20 220 lb 12.8 oz (100.2 kg)  04/05/20 223 lb 6.4 oz (101.3 kg)    Physical Exam Vitals and nursing note reviewed.  Constitutional:      General: He is not in acute distress.    Appearance: He is well-developed. He is not diaphoretic.     Comments: Well-appearing, comfortable, cooperative  HENT:     Head: Normocephalic and atraumatic.  Eyes:     General:        Right eye: No discharge.        Left eye: No discharge.     Conjunctiva/sclera: Conjunctivae normal.  Cardiovascular:     Rate and Rhythm: Normal rate.  Pulmonary:     Effort: Pulmonary effort is normal.  Skin:    General: Skin is warm and dry.     Findings: No erythema or rash.  Neurological:     Mental Status: He is alert and oriented to person, place, and time.  Psychiatric:        Behavior: Behavior normal.     Comments: Well groomed, good eye contact, normal speech and thoughts  Results for orders placed or performed in visit on 09/18/19  PSA  Result Value Ref Range   PSA 0.6 < OR = 4.0 ng/mL  Lipid panel  Result Value Ref Range   Cholesterol 175 <200 mg/dL   HDL 30 (L) > OR = 40 mg/dL   Triglycerides 133 <150 mg/dL   LDL Cholesterol (Calc) 120 (H) mg/dL (calc)   Total CHOL/HDL Ratio 5.8 (H) <5.0 (calc)   Non-HDL Cholesterol (Calc) 145 (H) <130 mg/dL (calc)  COMPLETE METABOLIC PANEL WITH GFR  Result Value Ref Range   Glucose, Bld 119 (H) 65 - 99 mg/dL   BUN 20 7 - 25 mg/dL   Creat 1.35 (H) 0.70 - 1.33 mg/dL   GFR, Est Non African American 58 (L) > OR = 60 mL/min/1.61m2   GFR, Est African American 68 > OR = 60 mL/min/1.30m2   BUN/Creatinine Ratio 15 6 - 22 (calc)   Sodium 138 135 - 146 mmol/L   Potassium 4.7 3.5 - 5.3 mmol/L   Chloride 103 98 - 110 mmol/L   CO2 29 20 - 32 mmol/L   Calcium 9.6 8.6 - 10.3 mg/dL   Total Protein 6.7 6.1 - 8.1 g/dL   Albumin 4.2 3.6 - 5.1 g/dL    Globulin 2.5 1.9 - 3.7 g/dL (calc)   AG Ratio 1.7 1.0 - 2.5 (calc)   Total Bilirubin 0.5 0.2 - 1.2 mg/dL   Alkaline phosphatase (APISO) 52 35 - 144 U/L   AST 12 10 - 35 U/L   ALT 13 9 - 46 U/L  CBC with Differential/Platelet  Result Value Ref Range   WBC 9.0 3.8 - 10.8 Thousand/uL   RBC 5.13 4.20 - 5.80 Million/uL   Hemoglobin 15.2 13.2 - 17.1 g/dL   HCT 45.3 38 - 50 %   MCV 88.3 80.0 - 100.0 fL   MCH 29.6 27.0 - 33.0 pg   MCHC 33.6 32.0 - 36.0 g/dL   RDW 12.4 11.0 - 15.0 %   Platelets 302 140 - 400 Thousand/uL   MPV 10.2 7.5 - 12.5 fL   Neutro Abs 5,490 1,500 - 7,800 cells/uL   Lymphs Abs 2,646 850 - 3,900 cells/uL   Absolute Monocytes 576 200 - 950 cells/uL   Eosinophils Absolute 162 15.0 - 500.0 cells/uL   Basophils Absolute 126 0.0 - 200.0 cells/uL   Neutrophils Relative % 61 %   Total Lymphocyte 29.4 %   Monocytes Relative 6.4 %   Eosinophils Relative 1.8 %   Basophils Relative 1.4 %  Hemoglobin A1c  Result Value Ref Range   Hgb A1c MFr Bld 6.3 (H) <5.7 % of total Hgb   Mean Plasma Glucose 134 (calc)   eAG (mmol/L) 7.4 (calc)      Assessment & Plan:   Problem List Items Addressed This Visit    Chronic back pain - Primary   Relevant Medications   venlafaxine XR (EFFEXOR-XR) 150 MG 24 hr capsule    Other Visit Diagnoses    Muscle cramps at night          Chronic back pain / lumbar/cervical Followed by orthopedics Prior surgery, s/p ESI therapy On med management, chronic pain therapy Tramadol from [facility] medical, checked PDMP Refill Venlafaxine now SNRI atypical pain control  #Muscle cramps Episodic lower extremity, likely due to inc activity working and reduced hydration, strain on muscles Trial on remedy options Hyland leg cramp and mustard Check labs chemistry Mag next month with upcoming physical  Meds ordered this encounter  Medications  . venlafaxine XR (EFFEXOR-XR) 150 MG 24 hr capsule    Sig: Take 1 capsule (150 mg total) by mouth daily with  breakfast.    Dispense:  90 capsule    Refill:  3      Follow up plan: Return if symptoms worsen or fail to improve, for Upcoming 1-3 months when ready for fasting lab then 1 week later Annual Physical.  Future labs ordered for 11/05/20   Nobie Putnam, [facility] 10/09/2020, 2:08 PM

## 2020-11-04 ENCOUNTER — Other Ambulatory Visit: Payer: Self-pay | Admitting: *Deleted

## 2020-11-04 DIAGNOSIS — R252 Cramp and spasm: Secondary | ICD-10-CM

## 2020-11-04 DIAGNOSIS — E782 Mixed hyperlipidemia: Secondary | ICD-10-CM

## 2020-11-04 DIAGNOSIS — Z0000 Encounter for general adult medical examination without abnormal findings: Secondary | ICD-10-CM

## 2020-11-04 DIAGNOSIS — J432 Centrilobular emphysema: Secondary | ICD-10-CM

## 2020-11-04 DIAGNOSIS — R351 Nocturia: Secondary | ICD-10-CM

## 2020-11-04 DIAGNOSIS — R7303 Prediabetes: Secondary | ICD-10-CM

## 2020-11-05 ENCOUNTER — Other Ambulatory Visit: Payer: Self-pay

## 2020-11-05 ENCOUNTER — Other Ambulatory Visit: Payer: BC Managed Care – PPO

## 2020-11-06 LAB — CBC WITH DIFFERENTIAL/PLATELET
Absolute Monocytes: 653 cells/uL (ref 200–950)
Basophils Absolute: 104 cells/uL (ref 0–200)
Basophils Relative: 1.2 %
Eosinophils Absolute: 157 cells/uL (ref 15–500)
Eosinophils Relative: 1.8 %
HCT: 46.3 % (ref 38.5–50.0)
Hemoglobin: 16.2 g/dL (ref 13.2–17.1)
Lymphs Abs: 2584 cells/uL (ref 850–3900)
MCH: 30.9 pg (ref 27.0–33.0)
MCHC: 35 g/dL (ref 32.0–36.0)
MCV: 88.4 fL (ref 80.0–100.0)
MPV: 10 fL (ref 7.5–12.5)
Monocytes Relative: 7.5 %
Neutro Abs: 5203 cells/uL (ref 1500–7800)
Neutrophils Relative %: 59.8 %
Platelets: 290 10*3/uL (ref 140–400)
RBC: 5.24 10*6/uL (ref 4.20–5.80)
RDW: 12.9 % (ref 11.0–15.0)
Total Lymphocyte: 29.7 %
WBC: 8.7 10*3/uL (ref 3.8–10.8)

## 2020-11-06 LAB — COMPLETE METABOLIC PANEL WITH GFR
AG Ratio: 1.6 (calc) (ref 1.0–2.5)
ALT: 13 U/L (ref 9–46)
AST: 11 U/L (ref 10–35)
Albumin: 4.6 g/dL (ref 3.6–5.1)
Alkaline phosphatase (APISO): 62 U/L (ref 35–144)
BUN: 15 mg/dL (ref 7–25)
CO2: 30 mmol/L (ref 20–32)
Calcium: 9.7 mg/dL (ref 8.6–10.3)
Chloride: 103 mmol/L (ref 98–110)
Creat: 1.32 mg/dL (ref 0.70–1.33)
GFR, Est African American: 69 mL/min/{1.73_m2} (ref >=60)
GFR, Est Non African American: 59 mL/min/{1.73_m2} — ABNORMAL LOW (ref >=60)
Globulin: 2.8 g/dL (calc) (ref 1.9–3.7)
Glucose, Bld: 111 mg/dL — ABNORMAL HIGH (ref 65–99)
Potassium: 4.5 mmol/L (ref 3.5–5.3)
Sodium: 137 mmol/L (ref 135–146)
Total Bilirubin: 0.5 mg/dL (ref 0.2–1.2)
Total Protein: 7.4 g/dL (ref 6.1–8.1)

## 2020-11-06 LAB — LIPID PANEL
Cholesterol: 216 mg/dL — ABNORMAL HIGH (ref <200)
HDL: 35 mg/dL — ABNORMAL LOW (ref >=40)
LDL Cholesterol (Calc): 151 mg/dL (calc) — ABNORMAL HIGH
Non-HDL Cholesterol (Calc): 181 mg/dL (calc) — ABNORMAL HIGH (ref <130)
Total CHOL/HDL Ratio: 6.2 (calc) — ABNORMAL HIGH (ref <5.0)
Triglycerides: 166 mg/dL — ABNORMAL HIGH (ref <150)

## 2020-11-06 LAB — MAGNESIUM: Magnesium: 2.3 mg/dL (ref 1.5–2.5)

## 2020-11-06 LAB — HEMOGLOBIN A1C
Hgb A1c MFr Bld: 6.4 % of total Hgb — ABNORMAL HIGH (ref <5.7)
Mean Plasma Glucose: 137 mg/dL
eAG (mmol/L): 7.6 mmol/L

## 2020-11-06 LAB — PSA: PSA: 1.03 ng/mL (ref <=4.0)

## 2020-11-06 LAB — TSH: TSH: 2.44 mIU/L (ref 0.40–4.50)

## 2020-11-12 ENCOUNTER — Other Ambulatory Visit: Payer: Self-pay

## 2020-11-12 ENCOUNTER — Ambulatory Visit (INDEPENDENT_AMBULATORY_CARE_PROVIDER_SITE_OTHER): Payer: BC Managed Care – PPO | Admitting: Family Medicine

## 2020-11-12 ENCOUNTER — Encounter: Payer: Self-pay | Admitting: Family Medicine

## 2020-11-12 VITALS — BP 126/85 | HR 102 | Ht 74.0 in | Wt 219.4 lb

## 2020-11-12 DIAGNOSIS — R911 Solitary pulmonary nodule: Secondary | ICD-10-CM

## 2020-11-12 DIAGNOSIS — Z720 Tobacco use: Secondary | ICD-10-CM

## 2020-11-12 DIAGNOSIS — L918 Other hypertrophic disorders of the skin: Secondary | ICD-10-CM

## 2020-11-12 DIAGNOSIS — E782 Mixed hyperlipidemia: Secondary | ICD-10-CM

## 2020-11-12 DIAGNOSIS — J432 Centrilobular emphysema: Secondary | ICD-10-CM

## 2020-11-12 DIAGNOSIS — Z0000 Encounter for general adult medical examination without abnormal findings: Secondary | ICD-10-CM | POA: Diagnosis not present

## 2020-11-12 DIAGNOSIS — R7303 Prediabetes: Secondary | ICD-10-CM

## 2020-11-12 DIAGNOSIS — D229 Melanocytic nevi, unspecified: Secondary | ICD-10-CM

## 2020-11-12 NOTE — Progress Notes (Signed)
Subjective:    Patient ID: Seth [name], male    DOB: [birth year], 60 y.o.   MRN: [id number]  Seth [name] is a 60 y.o. male presenting on 11/12/2020 for Annual Exam   HPI  Here for Annual Physical and Lab Review.  Pre-Diabetes Recent lab shows A1c up to 6.4, prior range 6.3 to 6.4 in past 2+ years. CBGs:None Meds:Never on CurrentlyNOTACEi / ARB Lifestyle: - Diet (limits carbs starches, drinks mostly water - but now he admits eating phase of more starches carbs, he says long hour work shift, often skips meal during long shift then has larger meal later) - Exercise (Limited regular exercise, active working as Dealer) Denies hypoglycemia  Allergic or seasonal Sinusitis Has flonase, best result  HYPERLIPIDEMIA: - Reports no concerns. Last lipid 11/2020,with abnormal results with low HDL and elevated LDL up to 151 prior range 120-130s. Total Cholesterol 216 -No cholesterol medicine -He is fasting today only had black coffee, ready for labs  Elevated Creatinine Lab Cr up to 1.3 Not always hydrated  COPD / TOBACCO ABUSE/ Pulmonary Nodules (Left Lower Lobe) Allergic Rhinosinusitis - Active smoker, 1ppd for up to 15 years - Previously quit for 10+years, then restarted. He quit cold [place] before. In past tried NRT with limited results - Not ready to quit - Previous dx of COPD with emphysema, without known complication. He does not use inhalers and no recent flare up - Last imaging 04/10/15 CT wo contrast chest - showed scattered pulmonary nodules, largest up to 40mm - recommended repeat CT in 2 years, he has not done this, interested to repeat - He is using Flonase regularly and breathe right strips  FOLLOW-UPDJD in Cervical and Lumbar Spine / Osteoarthritis in joints hands/knees etc Followed by [facility], Dr Phylliss Bob, recent Lumbar R side microdiscectomy 04/07/18, see op report in chart review. He is doing well post op overall has had some  issues and still some problems with generalized aches and pains from arthritis in other joints. Back is improved but still causes some problems. - Describes chronic wear and tear from occupation and old injuries - He has chronic intermittent pain sometimes in neck and upper extremity R > L with some paresthesia and tingling at times, only intermittent with flares - Currently taking Tramadol 50mg  TID - neck and back - Clement Sayres NP at [facility] (Previously [facility]) - for 10 years for neck - Also taking Venlafaxine XR 150mg  - 24 hr for low back pain R side upper leg at times with radiation of pain and tightening up   Health Maintenance: UTD Flu vaccine  - UTD Hep C negative 2018  -Colon CA Screening: Last Colonoscopy9/14/15(done by [facility]), results withno polyps, good for10years, due in 07/2024. Currently asymptomatic. No known family history of colon CA.- will request copy of record today  -Prostate CA Screening: Last PSA 1.03 (11/2020) prior 0.6 to 0.7. Currently asymptomatic. No known family history of prostate CA. Due for screeningPSA   Depression screen [facility] 2/9 10/09/2020 01/25/2020 09/25/2019  Decreased Interest 0 0 0  Down, Depressed, Hopeless 0 0 0  PHQ - 2 Score 0 0 0    Past Medical History:  Diagnosis Date  . Degenerative disc disease, cervical   . Degenerative disc disease, lumbar   . GERD (gastroesophageal reflux disease)   . Leg pain   . Neck pain   . Wears partial dentures    Past Surgical History:  Procedure Laterality Date  .  BLADDER SURGERY  2002   removed scar tissue  . CHOLECYSTECTOMY    . COLONOSCOPY    . LUMBAR LAMINECTOMY/DECOMPRESSION MICRODISCECTOMY N/A 04/07/2018   Procedure: RIGHT - SIDED LUMBAR FOUR- FIVE MICRODISECTOMY.;  Surgeon: Phylliss Bob, MD;  Location: [facility];  Service: Orthopedics;  Laterality: N/A;  RIGHT - SIDED LUMBAR FOUR- FIVE MICRODISECTOMY.    . MULTIPLE TOOTH EXTRACTIONS    .  UPPER EXTREMITY ANGIOGRAPHY Left 01/02/2019   Procedure: UPPER EXTREMITY ANGIOGRAPHY;  Surgeon: Algernon Huxley, MD;  Location: [facility];  Service: Cardiovascular;  Laterality: Left;   Social History   Socioeconomic History  . Marital status: Married    Spouse name: Not on file  . Number of children: Not on file  . Years of education: Not on file  . Highest education level: Not on file  Occupational History  . Not on file  Tobacco Use  . Smoking status: Current Every Day Smoker    Packs/day: 1.00    Years: 15.00    Pack years: 15.00    Types: Cigarettes  . Smokeless tobacco: Never Used  Vaping Use  . Vaping Use: Former  Substance and Sexual Activity  . Alcohol use: Yes    Comment: occasionally  . Drug use: No  . Sexual activity: Yes  Other Topics Concern  . Not on file  Social History Narrative  . Not on file   Social Determinants of Health   Financial Resource Strain: Not on file  Food Insecurity: Not on file  Transportation Needs: Not on file  Physical Activity: Not on file  Stress: Not on file  Social Connections: Not on file  Intimate Partner Violence: Not on file   Family History  Problem Relation Age of Onset  . Diabetes Mother   . Cancer Mother        bone  . Cancer Father   . Heart disease Father    Current Outpatient Medications on File Prior to Visit  Medication Sig  . albuterol (PROVENTIL HFA;VENTOLIN HFA) 108 (90 Base) MCG/ACT inhaler Inhale 2 puffs into the lungs every 4 (four) hours as needed for wheezing or shortness of breath (cough).  . baclofen (LIORESAL) 10 MG tablet   . fluticasone (FLONASE) 50 MCG/ACT nasal spray Place 2 sprays into both nostrils daily. Use for 4-6 weeks then stop and use seasonally or as needed.  Marland Kitchen ibuprofen (ADVIL,MOTRIN) 800 MG tablet   . NICORETTE 4 MG gum SMARTSIG:1 Gum By Mouth Daily  . pentoxifylline (TRENTAL) 400 MG CR tablet Take 1 tablet (400 mg total) by mouth 3 (three) times daily with meals.  .  venlafaxine XR (EFFEXOR-XR) 150 MG 24 hr capsule Take 1 capsule (150 mg total) by mouth daily with breakfast.   No current facility-administered medications on file prior to visit.    Review of Systems  Constitutional: Negative for activity change, appetite change, chills, diaphoresis, fatigue and fever.  HENT: Negative for congestion and hearing loss.   Eyes: Negative for visual disturbance.  Respiratory: Negative for apnea, cough, chest tightness, shortness of breath and wheezing.   Cardiovascular: Negative for chest pain, palpitations and leg swelling.  Gastrointestinal: Negative for abdominal pain, constipation, diarrhea, nausea and vomiting.  Endocrine: Negative for cold intolerance.  Genitourinary: Negative for decreased urine volume, difficulty urinating, dysuria, frequency and hematuria.  Musculoskeletal: Negative for arthralgias and neck pain.  Skin: Negative for rash.       Mole, atypical changes on penis  Neurological: Negative for dizziness, weakness,  light-headedness, numbness and headaches.  Hematological: Negative for adenopathy.  Psychiatric/Behavioral: Negative for behavioral problems, dysphoric mood and sleep disturbance.   Per HPI unless specifically indicated above      Objective:    BP 126/85   Pulse (!) 102   Ht 6\' 2"  (1.88 m)   Wt 219 lb 6.4 oz (99.5 kg)   SpO2 96%   BMI 28.17 kg/m   Wt Readings from Last 3 Encounters:  11/12/20 219 lb 6.4 oz (99.5 kg)  10/09/20 219 lb (99.3 kg)  07/22/20 220 lb 12.8 oz (100.2 kg)    Physical Exam Vitals and nursing note reviewed.  Constitutional:      General: He is not in acute distress.    Appearance: He is well-developed and well-nourished. He is not diaphoretic.     Comments: Well-appearing, comfortable, cooperative  HENT:     Head: Normocephalic and atraumatic.     Mouth/Throat:     Mouth: Oropharynx is clear and moist.  Eyes:     General:        Right eye: No discharge.        Left eye: No discharge.      Extraocular Movements: EOM normal.     Conjunctiva/sclera: Conjunctivae normal.     Pupils: Pupils are equal, round, and reactive to light.  Neck:     Thyroid: No thyromegaly.     Vascular: No carotid bruit.  Cardiovascular:     Rate and Rhythm: Normal rate and regular rhythm.     Pulses: Intact distal pulses.     Heart sounds: Normal heart sounds. No murmur heard.   Pulmonary:     Effort: Pulmonary effort is normal. No respiratory distress.     Breath sounds: Normal breath sounds. No wheezing or rales.  Abdominal:     General: Bowel sounds are normal. There is no distension.     Palpations: Abdomen is soft. There is no mass.     Tenderness: There is no abdominal tenderness.  Musculoskeletal:        General: No tenderness or edema. Normal range of motion.     Cervical back: Normal range of motion and neck supple.     Right lower leg: No edema.     Left lower leg: No edema.     Comments: Upper / Lower Extremities: - Normal muscle tone, strength bilateral upper extremities 5/5, lower extremities 5/5  Lymphadenopathy:     Cervical: No cervical adenopathy.  Skin:    General: Skin is warm and dry.     Findings: No erythema or rash.  Neurological:     Mental Status: He is alert and oriented to person, place, and time.     Comments: Distal sensation intact to light touch all extremities  Psychiatric:        Mood and Affect: Mood and affect normal.        Behavior: Behavior normal.     Comments: Well groomed, good eye contact, normal speech and thoughts      I have personally reviewed the radiology report from 04/10/2015 on CT wo contrast.  CLINICAL DATA:  Right chest and mediastinal pain for 1 year. Small left pulmonary nodules on previous abdominal CT. History of smoking.  EXAM: CT CHEST WITHOUT CONTRAST  TECHNIQUE: Multidetector CT imaging of the chest was performed following the standard protocol without IV contrast.  COMPARISON:  Abdominal CT  09/12/2013  FINDINGS: There is no significant lymphadenopathy on this non contrast examination. No significant pericardial  or pleural effusion. No acute abnormality in the upper abdominal structures. No significant large enlargement of the thoracic aorta or main pulmonary arteries.  The trachea and mainstem bronchi are patent. There are multiple blebs throughout both lungs and findings are suggestive for paraseptal emphysema.Small focal nodular density along the posterior right lower lobe on sequence 4, image 54 could represent atelectasis and was not present on the previous examination. Punctate nodule in the medial right upper lobe on sequence 4, image 19. Again noted are two small nodular densities in the periphery of the left lower lobe which were present on the previous abdominal CT and no significant change. 3 mm nodule in the left upper lobe on sequence 4, image 23. 5 mm nodule in the left lower lobe on sequence 4, image 47 and minimally changed from the previous examination. No large areas of consolidation.  The central pulmonary vascular structures are prominent and tortuous. Some of the vessels have a beaded appearance and may be aneurysmal. Difficult to exclude vascular malformations. This process appears to be bilateral, particularly in the lower lobes. Representative areas on sequence 4, image 35 and sequence 4, image 34. No acute bone abnormality.  IMPRESSION: Paraseptal emphysema with prominent and tortuous central pulmonary vessels. Findings could be related to the paraseptal emphysema. Many of the vessels have a beaded and nodular appearance. Difficult to exclude small vascular malformations, small aneurysms or vasculitis. Recommend a CTA of the chest to evaluate the pulmonary vasculature for better characterization.  There are scattered small pulmonary nodules throughout the lungs and difficult to differentiate small nodules from the ectatic  pulmonary vasculature. Largest definite pulmonary nodule measures 5 mm. Based on the history of smoking and paraseptal emphysema, recommend continued follow-up to ensure at least 2 years of stability of these nodules.   Electronically Signed   By: Markus Daft M.D.   On: 04/10/2015 09:30  Results for orders placed or performed in visit on 11/04/20  TSH  Result Value Ref Range   TSH 2.44 0.40 - 4.50 mIU/L  Magnesium  Result Value Ref Range   Magnesium 2.3 1.5 - 2.5 mg/dL  PSA  Result Value Ref Range   PSA 1.03 < OR = 4.0 ng/mL  Lipid panel  Result Value Ref Range   Cholesterol 216 (H) <200 mg/dL   HDL 35 (L) > OR = 40 mg/dL   Triglycerides 166 (H) <150 mg/dL   LDL Cholesterol (Calc) 151 (H) mg/dL (calc)   Total CHOL/HDL Ratio 6.2 (H) <5.0 (calc)   Non-HDL Cholesterol (Calc) 181 (H) <130 mg/dL (calc)  COMPLETE METABOLIC PANEL WITH GFR  Result Value Ref Range   Glucose, Bld 111 (H) 65 - 99 mg/dL   BUN 15 7 - 25 mg/dL   Creat 1.32 0.70 - 1.33 mg/dL   GFR, Est Non African American 59 (L) > OR = 60 mL/min/1.92m2   GFR, Est African American 69 > OR = 60 mL/min/1.50m2   BUN/Creatinine Ratio NOT APPLICABLE 6 - 22 (calc)   Sodium 137 135 - 146 mmol/L   Potassium 4.5 3.5 - 5.3 mmol/L   Chloride 103 98 - 110 mmol/L   CO2 30 20 - 32 mmol/L   Calcium 9.7 8.6 - 10.3 mg/dL   Total Protein 7.4 6.1 - 8.1 g/dL   Albumin 4.6 3.6 - 5.1 g/dL   Globulin 2.8 1.9 - 3.7 g/dL (calc)   AG Ratio 1.6 1.0 - 2.5 (calc)   Total Bilirubin 0.5 0.2 - 1.2 mg/dL   Alkaline  phosphatase (APISO) 62 35 - 144 U/L   AST 11 10 - 35 U/L   ALT 13 9 - 46 U/L  CBC with Differential/Platelet  Result Value Ref Range   WBC 8.7 3.8 - 10.8 Thousand/uL   RBC 5.24 4.20 - 5.80 Million/uL   Hemoglobin 16.2 13.2 - 17.1 g/dL   HCT 46.3 38.5 - 50.0 %   MCV 88.4 80.0 - 100.0 fL   MCH 30.9 27.0 - 33.0 pg   MCHC 35.0 32.0 - 36.0 g/dL   RDW 12.9 11.0 - 15.0 %   Platelets 290 140 - 400 Thousand/uL   MPV 10.0 7.5 - 12.5  fL   Neutro Abs 5,203 1,500 - 7,800 cells/uL   Lymphs Abs 2,584 850 - 3,900 cells/uL   Absolute Monocytes 653 200 - 950 cells/uL   Eosinophils Absolute 157 15 - 500 cells/uL   Basophils Absolute 104 0 - 200 cells/uL   Neutrophils Relative % 59.8 %   Total Lymphocyte 29.7 %   Monocytes Relative 7.5 %   Eosinophils Relative 1.8 %   Basophils Relative 1.2 %  Hemoglobin A1c  Result Value Ref Range   Hgb A1c MFr Bld 6.4 (H) <5.7 % of total Hgb   Mean Plasma Glucose 137 mg/dL   eAG (mmol/L) 7.6 mmol/L      Assessment & Plan:   Problem List Items Addressed This Visit    Tobacco abuse   Pre-diabetes    Elevated PreDM A1c 6.3 to 6.4 Still sub optimal diet  Plan:  1. Not on any therapy currently - future reconsider offer metformin low dose 2. Encourage improved lifestyle - low carb, low sugar diet, reduce portion size, continue improving regular exercise       Hyperlipidemia    Lipids 11/2020 The 10-year ASCVD risk score Mikey Bussing DC Jr., et al., 2013) is: 16.2%  Plan: 1. Discussed ASCVD risk reduction w/ statin - again declined at this time. 2. Encourage improved lifestyle - low carb/cholesterol, reduce portion size, continue improving regular exercise      Centrilobular emphysema (HCC)    Stable COPD without flare up exac Active smoker, chronic tobacco abuse Not on maintenance inhalers  Smoking cessation, not ready to quit Pursue Low dose CT lung scan repeat Follow-up in future may benefit from PFTs staging severity, maintenance therapy      Relevant Medications   NICORETTE 4 MG gum   Other Relevant Orders   AMB  Referral to Pulmonary Nodule Clinic    Other Visit Diagnoses    Annual physical exam    -  Primary   Left lower lobe pulmonary nodule       Relevant Orders   AMB  Referral to Pulmonary Nodule Clinic   Atypical mole       Relevant Orders   Ambulatory referral to Dermatology   Skin tag       Relevant Orders   Ambulatory referral to Dermatology       Updated Health Maintenance information - Declines COVID vaccine - UTD Flu Vaccine - Referral to [facility] for surveillance CT / smoker - not ready to quit, prior scan 2016, will need yearly CT wo contrast - Refer Dermatology for routine skin cancer screening - also for other issues skin tags on face and mole atypical on penis Reviewed recent lab results with patient Encouraged improvement to lifestyle with diet and exercise - Goal of weight loss   No orders of the defined types were placed in this encounter.  Follow up plan: Return in about 6 months (around 05/12/2021) for 6 month PreDM A1c.  Nobie Putnam, [facility] 11/12/2020, 2:11 PM

## 2020-11-12 NOTE — Assessment & Plan Note (Signed)
Lipids 11/2020 The 10-year ASCVD risk score Mikey Bussing DC Jr., et al., 2013) is: 16.2%  Plan: 1. Discussed ASCVD risk reduction w/ statin - again declined at this time. 2. Encourage improved lifestyle - low carb/cholesterol, reduce portion size, continue improving regular exercise

## 2020-11-12 NOTE — Assessment & Plan Note (Signed)
Elevated PreDM A1c 6.3 to 6.4 Still sub optimal diet  Plan:  1. Not on any therapy currently - future reconsider offer metformin low dose 2. Encourage improved lifestyle - low carb, low sugar diet, reduce portion size, continue improving regular exercise

## 2020-11-12 NOTE — Assessment & Plan Note (Signed)
Stable COPD without flare up exac °Active smoker, chronic tobacco abuse °Not on maintenance inhalers ° °Smoking cessation, not ready to quit °Pursue Low dose CT lung scan repeat °Follow-up in future may benefit from PFTs staging severity, maintenance therapy °

## 2020-11-12 NOTE — Patient Instructions (Addendum)
Thank you for coming to the office today.  Referral to [facility] for repeat CT scanning order.  Recommend Cholesterol medicine in future  Recent Labs    11/05/20 0806  HGBA1C 6.4*   At risk of type 2 diabetes Keep improving diet as discussed  STay tuned for apt for Dermatologist  [facility]   [place], [place] [address] Hours: 8AM-5PM Phone: [phone]  Please schedule a Follow-up Appointment to: Return in about 6 months (around 05/12/2021) for 6 month PreDM A1c.  If you have any other questions or concerns, please feel free to call the office or send a message through [place]. You may also schedule an earlier appointment if necessary.  Additionally, you may be receiving a survey about your experience at our office within a few days to 1 week by e-mail or mail. We value your feedback.  Nobie Putnam, DO [place]

## 2020-11-25 ENCOUNTER — Other Ambulatory Visit: Payer: Self-pay | Admitting: Oncology

## 2020-11-25 DIAGNOSIS — R918 Other nonspecific abnormal finding of lung field: Secondary | ICD-10-CM

## 2020-11-25 NOTE — Progress Notes (Signed)
  Pulmonary Nodule Clinic Telephone Note [place]   Received referral from Dr. Parks Ranger  HPI: [name] is a 60 year old male with past medical history significant for emphysema, osteoarthritis, chronic back pain, hyperlipidemia, tobacco abuse and diabetes who has history of pulmonary nodules dating back to 2016.  He was seen recently by his PCP who recommended follow-up.  Review and Recommendations: I personally reviewed all patient's previous imaging including most recent chest x-ray from June 2019 and CT chest from June 2016.  I recommend follow-up with noncontrast ct chest in the next week or so.  Social History: Patient is a current everyday smoker.  Tobacco Use: High Risk  . Smoking Tobacco Use: Current Every Day Smoker  . Smokeless Tobacco Use: Never Used    High risk factors include: History of heavy smoking, exposure to asbestos, radium or uranium, personal family history of lung cancer, older age, sex (females greater than males), race (black and native [place] greater than weight), marginal speculation, upper lobe location, multiplicity (less than 5 nodules increases risk for malignancy) and emphysema and/or pulmonary fibrosis.   This recommendation follows the consensus statement: Guidelines for Management of Incidental Pulmonary Nodules Detected on CT Images: From the Fleischner Society 2017; Radiology 2017; 284:228-243.    I have placed order for CT scan without contrast to be completed in the next 1 to 2 weeks.  Disposition: Order placed for repeat CT chest.  Lung nodule Will notify [place] in scheduling. [place] to call patient with appointment date and time. Return to pulmonary nodule clinic a few days after his repeat imaging to discuss results and plan moving forward.  Faythe Casa, NP 11/25/2020 4:17 PM

## 2020-11-26 ENCOUNTER — Telehealth: Payer: Self-pay | Admitting: *Deleted

## 2020-11-26 NOTE — Telephone Encounter (Signed)
Pt made aware of referral to Lung Nodule Clinic from PCP. Pt is in agreement to have upcoming CT scan and follow up as recommended.  Pt has been made aware of upcoming appts for follow up CT scan and follow up appt with Jennifer Burns, NP in the Lung Nodule Clinic. Pt verbalized understanding. Nothing further needed at this time.  

## 2020-12-03 ENCOUNTER — Ambulatory Visit: Payer: BC Managed Care – PPO | Attending: Oncology

## 2020-12-04 ENCOUNTER — Inpatient Hospital Stay: Payer: BC Managed Care – PPO | Admitting: Oncology

## 2020-12-04 NOTE — Progress Notes (Signed)
This encounter was created in error - please disregard.

## 2020-12-06 ENCOUNTER — Telehealth: Payer: Self-pay | Admitting: *Deleted

## 2020-12-06 NOTE — Telephone Encounter (Signed)
No show for appts in the lung nodule clinic this past week. Left message with patient to reschedule missed appts. Awaiting call back.

## 2021-01-02 ENCOUNTER — Telehealth: Payer: Self-pay

## 2021-01-02 NOTE — Telephone Encounter (Signed)
Copied from [place] [id number]. Topic: General - Other >> Jan 02, 2021 11:54 AM Tessa Lerner A wrote: Reason for CRM: Patient is requesting to have orders for a CT scan sent to [facility] at Fax: [phone] Please contact if necessary.   The pt called requesting that you place an order for a CT lung scan with [facility]. He said he contacted his insurance company and they state this scan is covered at this location.

## 2021-01-02 NOTE — Telephone Encounter (Signed)
Hey Shawn,   Could we just fast track him to LDCT screening program to keep him in our health system? I believe he would qualify. He is over the age of 8, current everyday smoker.   Faythe Casa, NP 01/02/2021 2:55 PM

## 2021-01-02 NOTE — Telephone Encounter (Signed)
I have called patient to clarify his request.  He said that due to insurance cost - imaging at location he requested through [facility] would be $500 compared to $1500 from [facility].  He is asking if he can get the CT image at [facility] and then follow back up with [facility].  Anderson Malta - would you be able to assist in submitting an order to [facility] so that you would receive the result?  Or do you need me to send an order and try to forward to you?  Is this something that can be done with outside image, and then still follow-up with your clinic?  CC: Burgess Estelle RN  Thanks for your input  Nobie Putnam, [facility] 01/02/2021, 2:48 PM

## 2021-01-03 ENCOUNTER — Encounter: Payer: Self-pay | Admitting: *Deleted

## 2021-01-03 NOTE — Telephone Encounter (Addendum)
Yes, from what I see he would be an excellent participant for lung cancer screening. I'll contact him today and it should be available for no out of pocked expense as long as we are in network, which I believe we are.  Thanks, Derek Laughter  Received referral for low dose lung cancer screening CT scan. Message left at phone number listed in EMR for patient to call me back to facilitate scheduling scan. Will also send Mychart message to patient.

## 2021-01-03 NOTE — Telephone Encounter (Signed)
Thanks Shawn.   Faythe Casa, NP 01/03/2021 9:47 AM

## 2021-01-03 NOTE — Telephone Encounter (Signed)
Thank you both. Yes cost savings and stay in network, and keep follow up with your clinic will all be excellent.  Seth [name], [facility] 01/03/2021, 11:40 AM

## 2021-01-06 ENCOUNTER — Telehealth: Payer: Self-pay | Admitting: *Deleted

## 2021-01-06 NOTE — Telephone Encounter (Signed)
Received referral for low dose lung cancer screening CT scan. Message left at phone number listed in EMR for patient to call me back to facilitate scheduling scan.  

## 2021-01-07 ENCOUNTER — Telehealth: Payer: Self-pay | Admitting: *Deleted

## 2021-01-07 DIAGNOSIS — Z87891 Personal history of nicotine dependence: Secondary | ICD-10-CM

## 2021-01-07 DIAGNOSIS — F17200 Nicotine dependence, unspecified, uncomplicated: Secondary | ICD-10-CM

## 2021-01-07 DIAGNOSIS — Z122 Encounter for screening for malignant neoplasm of respiratory organs: Secondary | ICD-10-CM

## 2021-01-07 NOTE — Telephone Encounter (Signed)
Received referral for initial lung cancer screening scan. Contacted patient and obtained smoking history,(current, 30 pack year) as well as answering questions related to screening process. Patient denies signs of lung cancer such as weight loss or hemoptysis. Patient denies comorbidity that would prevent curative treatment if lung cancer were found. Patient is scheduled for shared decision making visit and CT scan on 01/20/21.

## 2021-01-20 ENCOUNTER — Ambulatory Visit
Admission: RE | Admit: 2021-01-20 | Discharge: 2021-01-20 | Disposition: A | Discharge status: Home / Self Care | Payer: BC Managed Care – PPO | Source: Ambulatory Visit | Attending: Oncology | Admitting: Oncology

## 2021-01-20 ENCOUNTER — Inpatient Hospital Stay: Payer: BC Managed Care – PPO | Attending: Oncology | Admitting: Nurse Practitioner

## 2021-01-20 ENCOUNTER — Other Ambulatory Visit: Payer: Self-pay

## 2021-01-20 DIAGNOSIS — Z87891 Personal history of nicotine dependence: Secondary | ICD-10-CM

## 2021-01-20 DIAGNOSIS — Z122 Encounter for screening for malignant neoplasm of respiratory organs: Secondary | ICD-10-CM | POA: Insufficient documentation

## 2021-01-20 DIAGNOSIS — F17200 Nicotine dependence, unspecified, uncomplicated: Secondary | ICD-10-CM | POA: Diagnosis present

## 2021-01-20 NOTE — Progress Notes (Signed)
Virtual Visit via Video Enabled Telemedicine Note   I connected with Seth [name] on 01/20/21 at 11:30 AM EST by video enabled telemedicine visit and verified that I am speaking with the correct person using two identifiers.   I discussed the limitations, risks, security and privacy concerns of performing an evaluation and management service by telemedicine and the availability of in-person appointments. I also discussed with the patient that there may be a patient responsible charge related to this service. The patient expressed understanding and agreed to proceed.   Other persons participating in the visit and their role in the encounter: Burgess Estelle, RN- checking in patient & navigation  Patient's location: work  Provider's location: Clinic  Chief Complaint: Low Dose CT Screening  Patient agreed to evaluation by telemedicine to discuss shared decision making for consideration of low dose CT lung cancer screening.    In accordance with CMS guidelines, patient has met eligibility criteria including age, absence of signs or symptoms of lung cancer.  Social History   Tobacco Use  . Smoking status: Current Every Day Smoker    Packs/day: 1.00    Years: 30.00    Pack years: 30.00    Types: Cigarettes  . Smokeless tobacco: Never Used  Substance Use Topics  . Alcohol use: Yes    Comment: occasionally     A shared decision-making session was conducted prior to the performance of CT scan. This includes one or more decision aids, includes benefits and harms of screening, follow-up diagnostic testing, over-diagnosis, false positive rate, and total radiation exposure.   Counseling on the importance of adherence to annual lung cancer LDCT screening, impact of co-morbidities, and ability or willingness to undergo diagnosis and treatment is imperative for compliance of the program.   Counseling on the importance of continued smoking cessation for former smokers; the importance of smoking  cessation for current smokers, and information about tobacco cessation interventions have been given to patient including [place] and [employer] [facility] [employer] programs.   Written order for lung cancer screening with LDCT has been given to the patient and any and all questions have been answered to the best of my abilities.    Yearly follow up will be coordinated by Burgess Estelle, Thoracic Navigator.  I discussed the assessment and treatment plan with the patient. The patient was provided an opportunity to ask questions and all were answered. The patient agreed with the plan and demonstrated an understanding of the instructions.   The patient was advised to call back or seek an in-person evaluation if the symptoms worsen or if the condition fails to improve as anticipated.   I provided 15 minutes of face-to-face video visit time dedicated to the care of this patient on the date of this encounter to include pre-visit review of smoking history, face-to-face time with the patient, and post visit ordering of testing/documentation.   Beckey Rutter, DNP, AGNP-C [place] at [facility] [phone] (clinic)

## 2021-01-27 ENCOUNTER — Encounter: Payer: Self-pay | Admitting: *Deleted

## 2021-03-12 ENCOUNTER — Ambulatory Visit: Payer: BC Managed Care – PPO | Admitting: Dermatology

## 2021-03-12 ENCOUNTER — Other Ambulatory Visit: Payer: Self-pay

## 2021-03-12 DIAGNOSIS — D485 Neoplasm of uncertain behavior of skin: Secondary | ICD-10-CM

## 2021-03-12 DIAGNOSIS — L820 Inflamed seborrheic keratosis: Secondary | ICD-10-CM

## 2021-03-12 DIAGNOSIS — L918 Other hypertrophic disorders of the skin: Secondary | ICD-10-CM

## 2021-03-12 NOTE — Patient Instructions (Signed)

## 2021-03-12 NOTE — Progress Notes (Signed)
   New Patient Visit  Subjective  Seth [name] is a 60 y.o. male who presents for the following: Other (Skin tags face, groin area). Referred by Dr. Parks Ranger  The following portions of the chart were reviewed this encounter and updated as appropriate:   Tobacco  Allergies  Meds  Problems  Med Hx  Surg Hx  Fam Hx     Review of Systems:  No other skin or systemic complaints except as noted in HPI or Assessment and Plan.  Objective  Well appearing patient in no apparent distress; mood and affect are within normal limits.  A focused examination was performed including face, pubic area. Relevant physical exam findings are noted in the Assessment and Plan.  Objective  Left Ear sup antihelix: Erythematous keratotic or waxy stuck-on papule or plaque.   Objective  Left Upper Eyelid: Fleshy, skin-colored pedunculated papules.    Objective  Right Penile Shaft: Owens Shark verrucous papule  Objective  Left Temple: Flesh colored papule   Assessment & Plan  Inflamed seborrheic keratosis Left Ear sup antihelix Destruction of lesion - Left Ear sup antihelix Complexity: simple   Destruction method: cryotherapy   Informed consent: discussed and consent obtained   Timeout:  patient name, date of birth, surgical site, and procedure verified Lesion destroyed using liquid nitrogen: Yes   Region frozen until ice ball extended beyond lesion: Yes   Outcome: patient tolerated procedure well with no complications   Post-procedure details: wound care instructions given    Skin tag Left Upper Eyelid Advised patient not likely to be covered by insurance and discussed non covered fee.   Non covered consent form signed today.  Epidermal / dermal shaving - Left Upper Eyelid  Informed consent: discussed and consent obtained   Patient was prepped and draped in usual sterile fashion: Area prepped with alcohol. Anesthesia: the lesion was anesthetized in a standard fashion   Anesthetic:   1% lidocaine w/ epinephrine 1-100,000 buffered w/ 8.4% NaHCO3 Instrument used: scissors   Hemostasis achieved with: pressure, aluminum chloride and electrodesiccation   Outcome: patient tolerated procedure well   Post-procedure details: wound care instructions given    Neoplasm of uncertain behavior of skin (2) Right Penile Shaft Left Temple Viral wart / Condyloma vs ISK of right penile shaft - will plan shave removal on follow up.  Irritated nevus R/O dysplasia of left temple - will plan shave removal on follow up. Pt wanted to wait until he had some time off work to recover.  Return if symptoms worsen or fail to improve, for 2-3 weeks for shave removal x 2.  I, Ashok Cordia, CMA, am acting as scribe for Sarina Ser, MD .  Documentation: I have reviewed the above documentation for accuracy and completeness, and I agree with the above.  Sarina Ser, MD

## 2021-03-18 ENCOUNTER — Encounter: Payer: Self-pay | Admitting: Dermatology

## 2021-04-10 ENCOUNTER — Ambulatory Visit: Payer: BC Managed Care – PPO | Admitting: Dermatology

## 2021-05-12 ENCOUNTER — Other Ambulatory Visit: Payer: Self-pay | Admitting: Family Medicine

## 2021-05-12 ENCOUNTER — Ambulatory Visit: Payer: BC Managed Care – PPO | Admitting: Family Medicine

## 2021-05-12 ENCOUNTER — Other Ambulatory Visit: Payer: Self-pay

## 2021-05-12 ENCOUNTER — Encounter: Payer: Self-pay | Admitting: Family Medicine

## 2021-05-12 VITALS — BP 135/84 | HR 92 | Ht 74.0 in | Wt 217.0 lb

## 2021-05-12 DIAGNOSIS — Z0000 Encounter for general adult medical examination without abnormal findings: Secondary | ICD-10-CM

## 2021-05-12 DIAGNOSIS — J432 Centrilobular emphysema: Secondary | ICD-10-CM

## 2021-05-12 DIAGNOSIS — R351 Nocturia: Secondary | ICD-10-CM

## 2021-05-12 DIAGNOSIS — Z6827 Body mass index (BMI) 27.0-27.9, adult: Secondary | ICD-10-CM | POA: Diagnosis not present

## 2021-05-12 DIAGNOSIS — R7303 Prediabetes: Secondary | ICD-10-CM

## 2021-05-12 DIAGNOSIS — E782 Mixed hyperlipidemia: Secondary | ICD-10-CM

## 2021-05-12 LAB — POCT GLYCOSYLATED HEMOGLOBIN (HGB A1C): Hemoglobin A1C: 6 % — AB (ref 4.0–5.6)

## 2021-05-12 NOTE — Patient Instructions (Addendum)
Thank you for coming to the office today.  Recent Labs    11/05/20 0806 05/12/21 1520  HGBA1C 6.4* 6.0*   Keep up the great work!  DUE for FASTING BLOOD WORK (no food or drink after midnight before the lab appointment, only water or coffee without cream/sugar on the morning of)  SCHEDULE "Lab Only" visit in the morning at the clinic for lab draw in 6 MONTHS   - Make sure Lab Only appointment is at about 1 week before your next appointment, so that results will be available  For Lab Results, once available within 2-3 days of blood draw, you can can log in to MyChart online to view your results and a brief explanation. Also, we can discuss results at next follow-up visit.   Please schedule a Follow-up Appointment to: Return in about 6 months (around 11/12/2021) for 6 month fasting lab only then 1 week later Annual Physical.  If you have any other questions or concerns, please feel free to call the office or send a message through [employer]. You may also schedule an earlier appointment if necessary.  Additionally, you may be receiving a survey about your experience at our office within a few days to 1 week by e-mail or mail. We value your feedback.  Nobie Putnam, DO [place]

## 2021-05-12 NOTE — Progress Notes (Signed)
Subjective:    Patient ID: Seth [name], male    DOB: June 11, 1961, 60 y.o.   MRN: [id number]  Seth [name] is a 60 y.o. male presenting on 05/12/2021 for Pre-Diabetes   HPI  Pre-Diabetes 6 month f/u Previous A1c 6.4, now result down to 6.0 on lab. He has reduced sodas significantly, avoiding fast food, and improved dinner meals cooked Reduced carbs/starches CBGs: None Meds: Never on Currently NOT ACEi / ARB Lifestyle: - Diet (limits carbs starches, drinks mostly water - but now he admits eating phase of more starches carbs, he says long hour work shift, often skips meal during long shift then has larger meal later) - Exercise (Limited regular exercise, active working as Dealer) Denies hypoglycemia   Additional topics  Muscle cramping likely - from warmer temperature, sweating, inc work exercise.  7/9 R shoulder injury with chainsaw, has some neck issues on R side overall but shoulder feels better. some improvement now, he may go to ortho   Depression screen [facility] 2/9 10/09/2020 01/25/2020 09/25/2019  Decreased Interest 0 0 0  Down, Depressed, Hopeless 0 0 0  PHQ - 2 Score 0 0 0    Social History   Tobacco Use   Smoking status: Every Day    Packs/day: 1.00    Years: 30.00    Pack years: 30.00    Types: Cigarettes   Smokeless tobacco: Never  Vaping Use   Vaping Use: Former  Substance Use Topics   Alcohol use: Yes    Comment: occasionally   Drug use: No    Review of Systems Per HPI unless specifically indicated above     Objective:    BP 135/84   Pulse 92   Ht 6\' 2"  (1.88 m)   Wt 217 lb (98.4 kg)   SpO2 96%   BMI 27.86 kg/m   Wt Readings from Last 3 Encounters:  05/12/21 217 lb (98.4 kg)  01/20/21 219 lb (99.3 kg)  11/12/20 219 lb 6.4 oz (99.5 kg)    Physical Exam Vitals and nursing note reviewed.  Constitutional:      General: He is not in acute distress.    Appearance: Normal appearance. He is well-developed. He is not diaphoretic.      Comments: Well-appearing, comfortable, cooperative  HENT:     Head: Normocephalic and atraumatic.  Eyes:     General:        Right eye: No discharge.        Left eye: No discharge.     Conjunctiva/sclera: Conjunctivae normal.  Neck:     Comments: Lower paraspinal cervical muscles with hypertonicity and spasm. He has some spasm into trapezius as well impacting R side of neck shoulder. Cardiovascular:     Rate and Rhythm: Normal rate.  Pulmonary:     Effort: Pulmonary effort is normal.  Musculoskeletal:     Comments: RIGHT Shoulder Inspection: Normal appearance bilateral symmetrical Palpation: Non-tender to palpation over anterior, lateral, or posterior shoulder  ROM: Full intact active ROM forward flexion, abduction, internal / external rotation, symmetrical Special Testing: Rotator cuff testing negative for weakness with supraspinatus full can and empty can test, Hawkin's AC impingement negative for pain Strength: Normal strength 5/5 flex/ext, ext rot / int rot, grip, rotator cuff str testing. Neurovascular: Distally intact pulses, sensation to light touch   Skin:    General: Skin is warm and dry.     Findings: No erythema or rash.  Neurological:     Mental Status: He  is alert and oriented to person, place, and time.  Psychiatric:        Mood and Affect: Mood normal.        Behavior: Behavior normal.        Thought Content: Thought content normal.     Comments: Well groomed, good eye contact, normal speech and thoughts      Results for orders placed or performed in visit on 05/12/21  POCT HgB A1C  Result Value Ref Range   Hemoglobin A1C 6.0 (A) 4.0 - 5.6 %      Assessment & Plan:   Problem List Items Addressed This Visit     Pre-diabetes - Primary    Improved PreDM A1c to 6.0 now Improving diet lifestyle   Plan:  1. Not on any therapy currently - future reconsider offer metformin low dose 2. Encourage improved lifestyle - low carb, low sugar diet, reduce portion  size, continue improving regular exercise        Relevant Orders   POCT HgB A1C (Completed)   Other Visit Diagnoses     BMI 27.0-27.9,adult          R Shoulder likely more trapezius / paraspinal neck spasm hypertonicity No sign of rotator cuff problem at this time May return to ortho if unresolved.    No orders of the defined types were placed in this encounter.    Follow up plan: Return in about 6 months (around 11/12/2021) for 6 month fasting lab only then 1 week later Annual Physical.  Future labs ordered for 11/2021  Nobie Putnam, [facility] 05/12/2021, 3:19 PM

## 2021-05-12 NOTE — Assessment & Plan Note (Signed)
Improved PreDM A1c to 6.0 now Improving diet lifestyle   Plan:  1. Not on any therapy currently - future reconsider offer metformin low dose 2. Encourage improved lifestyle - low carb, low sugar diet, reduce portion size, continue improving regular exercise

## 2021-06-01 IMAGING — CT CT CHEST LUNG CANCER SCREENING LOW DOSE W/O CM
2 of 5 series · 15 of 40 positions shown, 18 images · non-contrast
Comparison: 05/21/2015.

CLINICAL DATA: Lung cancer screening. Current smoker. 30 pack-year
history

EXAM:
CT CHEST WITHOUT CONTRAST LOW-DOSE FOR LUNG CANCER SCREENING
TECHNIQUE: Multidetector CT imaging of the chest was performed following the
standard protocol without IV contrast.

[Series 3: lung 1.00 · axial · 0.69mm/px · z∈[-1219,-918]mm · 12 of 333 slices shown, 15 images]
[im 16/333  mediastinal]
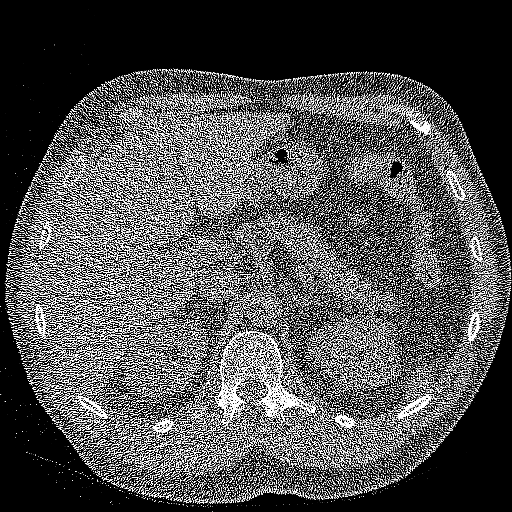
[im 16/333  lung]
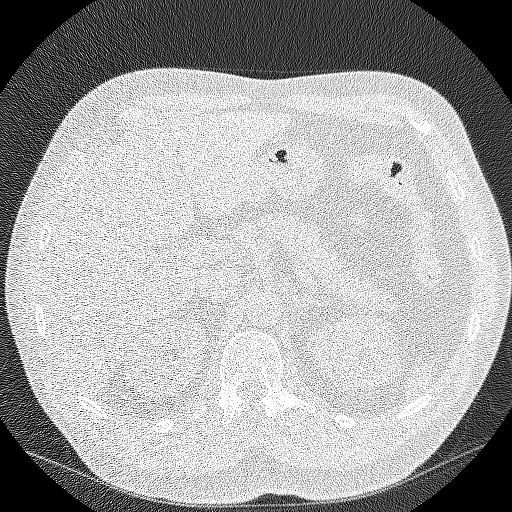
[im 48/333  lung]
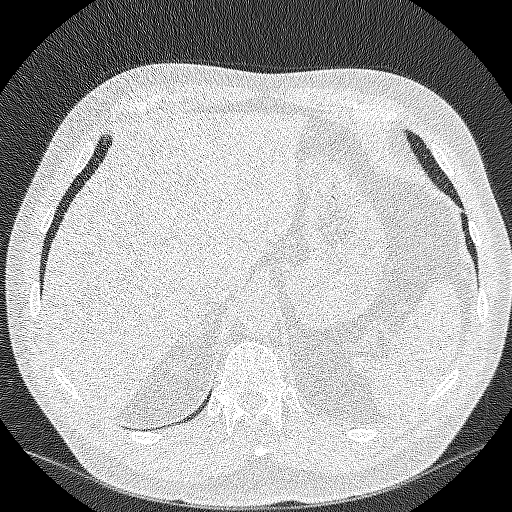
[im 80/333  lung]
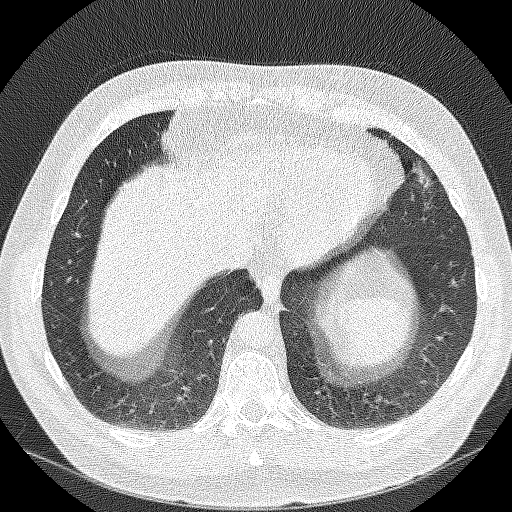
[im 95/333  lung]
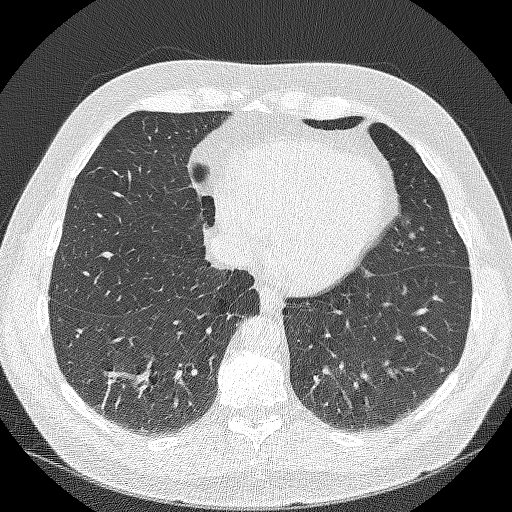
[im 127/333  mediastinal]
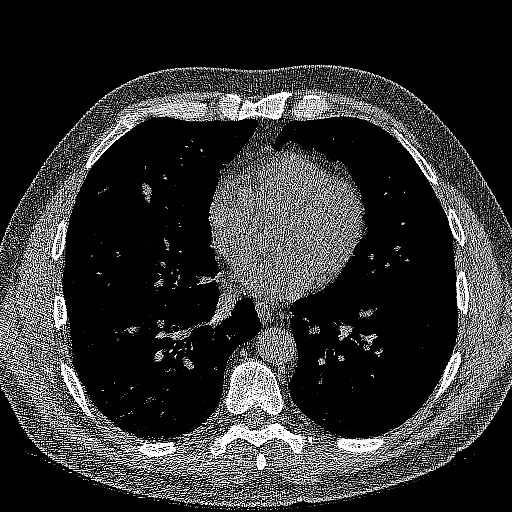
[im 127/333  lung]
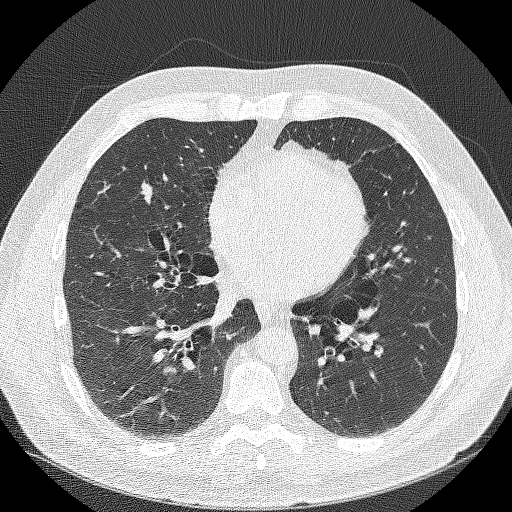
[im 159/333  lung]
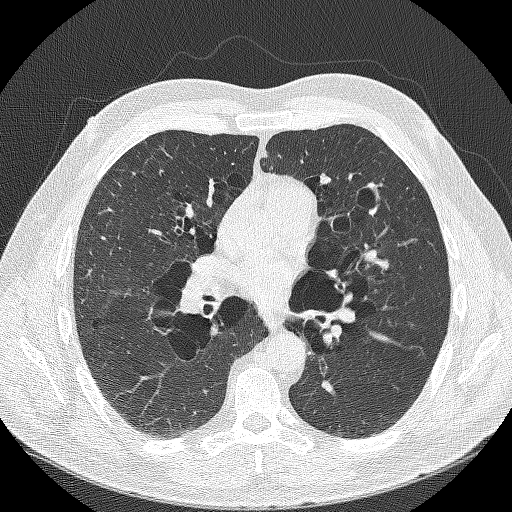
[im 174/333  lung]
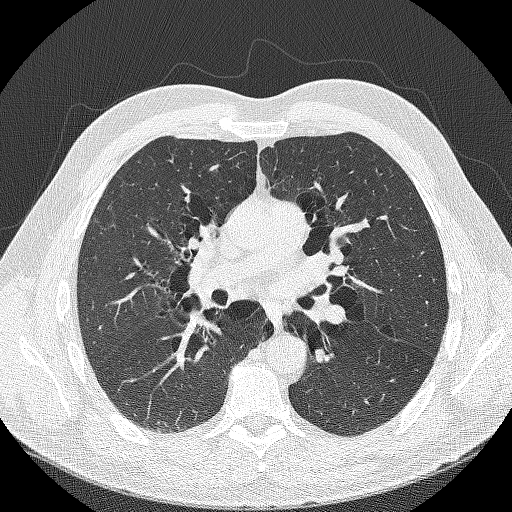
[im 206/333  lung]
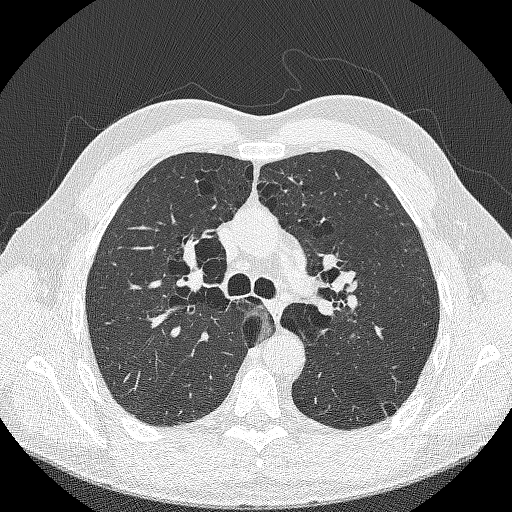
[im 238/333  mediastinal]
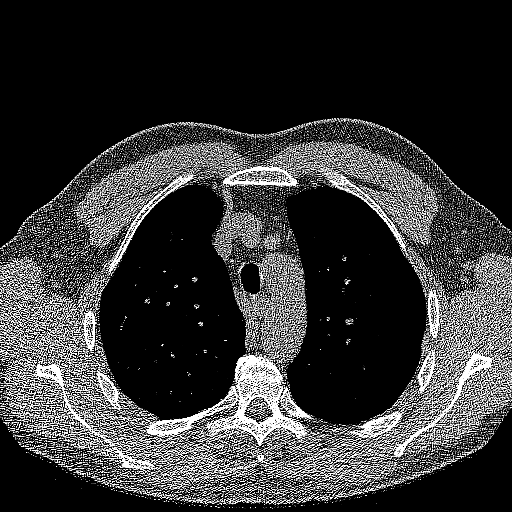
[im 238/333  lung]
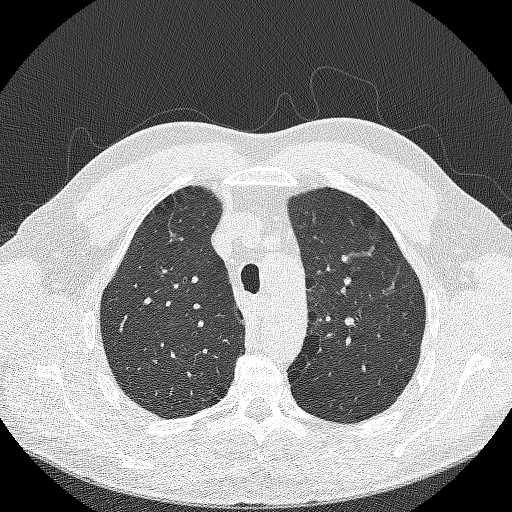
[im 253/333  lung]
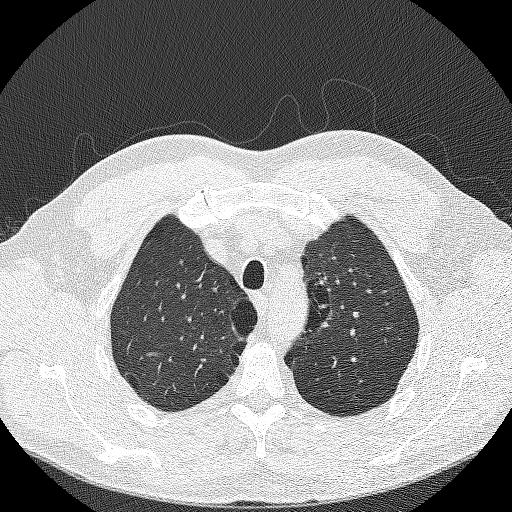
[im 285/333  lung]
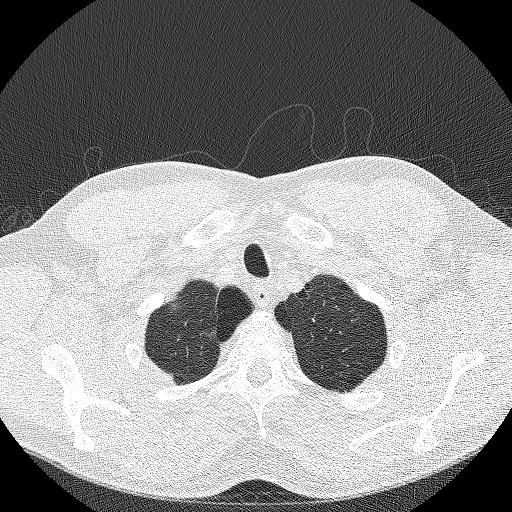
[im 317/333  lung]
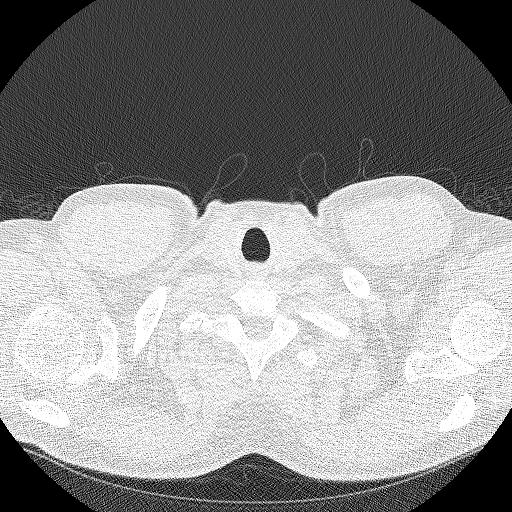

[Series 5: coronals lung 1.00 cor · coronal · 0.65mm/px · 3 of 303 slices shown]
[im 61/303  lung]
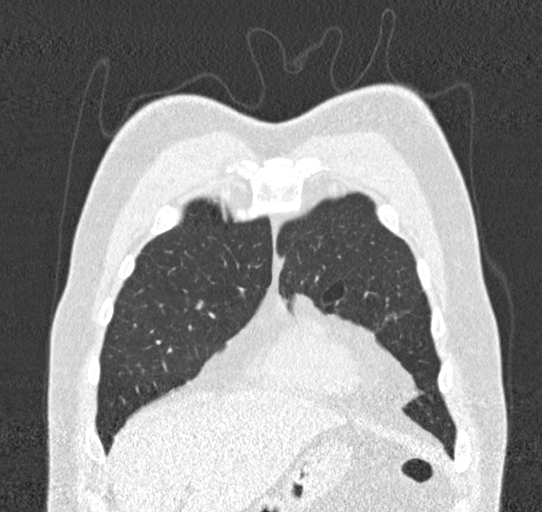
[im 121/303  lung]
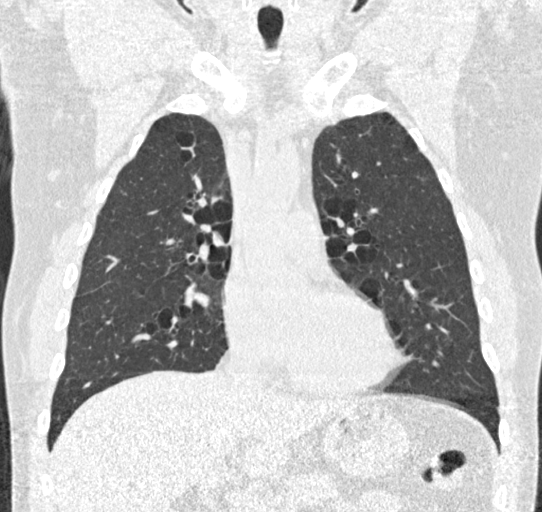
[im 182/303  lung]
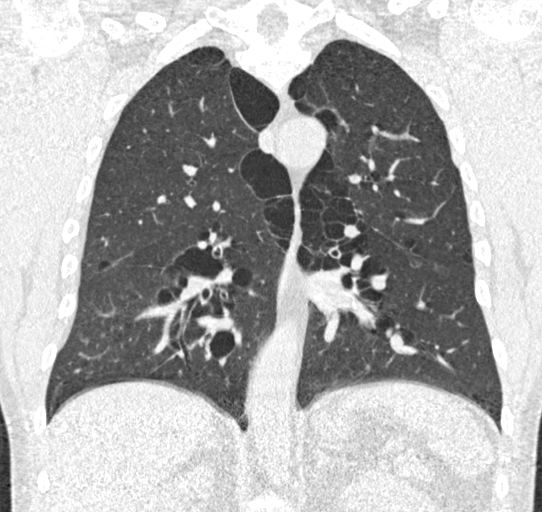

[15 of 40 positions shown; findings below may reference images not displayed]

FINDINGS: Cardiovascular: Normal heart size. No pericardial effusion. Aortic
atherosclerosis. Coronary artery calcifications.

Mediastinum/Nodes: No enlarged mediastinal, hilar, or axillary lymph
nodes. Thyroid gland, trachea, and esophagus demonstrate no
significant findings.

Lungs/Pleura: Advanced paraseptal and moderate centrilobular
emphysema. Diffuse bronchial wall thickening. No pleural effusion,
airspace consolidation, or atelectasis. Numerous nodular densities
are identified throughout both lungs. These appear unchanged when
compared with 05/21/2015 and are favored to represent multiple
arteriovenous malformations. No suspicious lung nodules identified
at this time.

Upper Abdomen: No acute abnormality.

Musculoskeletal: No chest wall mass or suspicious bone lesions
identified.
IMPRESSION: 1. Lung-RADS 2, benign appearance or behavior. Continue annual
screening with low-dose chest CT without contrast in 12 months.
2. Coronary artery calcifications noted.
3. Diffuse bronchial wall thickening with emphysema, as above;
imaging findings suggestive of underlying COPD. Emphysema
(S2M5N-HBL.S).

Aortic Atherosclerosis (S2M5N-KLU.U) and Emphysema (S2M5N-HBL.S).

## 2021-11-04 ENCOUNTER — Other Ambulatory Visit: Payer: Self-pay

## 2021-11-04 DIAGNOSIS — E782 Mixed hyperlipidemia: Secondary | ICD-10-CM

## 2021-11-04 DIAGNOSIS — R351 Nocturia: Secondary | ICD-10-CM

## 2021-11-04 DIAGNOSIS — J432 Centrilobular emphysema: Secondary | ICD-10-CM

## 2021-11-04 DIAGNOSIS — R7303 Prediabetes: Secondary | ICD-10-CM

## 2021-11-04 DIAGNOSIS — Z0000 Encounter for general adult medical examination without abnormal findings: Secondary | ICD-10-CM

## 2021-11-05 ENCOUNTER — Other Ambulatory Visit: Payer: Self-pay

## 2021-11-05 ENCOUNTER — Other Ambulatory Visit: Payer: BC Managed Care – PPO

## 2021-11-06 ENCOUNTER — Other Ambulatory Visit: Payer: Self-pay | Admitting: Family Medicine

## 2021-11-06 DIAGNOSIS — M5442 Lumbago with sciatica, left side: Secondary | ICD-10-CM

## 2021-11-06 DIAGNOSIS — G8929 Other chronic pain: Secondary | ICD-10-CM

## 2021-11-06 LAB — CBC WITH DIFFERENTIAL/PLATELET
Absolute Monocytes: 575 cells/uL (ref 200–950)
Basophils Absolute: 113 cells/uL (ref 0–200)
Basophils Relative: 1.4 %
Eosinophils Absolute: 227 cells/uL (ref 15–500)
Eosinophils Relative: 2.8 %
HCT: 44.7 % (ref 38.5–50.0)
Hemoglobin: 15 g/dL (ref 13.2–17.1)
Lymphs Abs: 2600 cells/uL (ref 850–3900)
MCH: 30.4 pg (ref 27.0–33.0)
MCHC: 33.6 g/dL (ref 32.0–36.0)
MCV: 90.5 fL (ref 80.0–100.0)
MPV: 10 fL (ref 7.5–12.5)
Monocytes Relative: 7.1 %
Neutro Abs: 4585 cells/uL (ref 1500–7800)
Neutrophils Relative %: 56.6 %
Platelets: 307 10*3/uL (ref 140–400)
RBC: 4.94 10*6/uL (ref 4.20–5.80)
RDW: 12.5 % (ref 11.0–15.0)
Total Lymphocyte: 32.1 %
WBC: 8.1 10*3/uL (ref 3.8–10.8)

## 2021-11-06 LAB — COMPLETE METABOLIC PANEL WITH GFR
AG Ratio: 1.6 (calc) (ref 1.0–2.5)
ALT: 13 U/L (ref 9–46)
AST: 13 U/L (ref 10–35)
Albumin: 4.3 g/dL (ref 3.6–5.1)
Alkaline phosphatase (APISO): 63 U/L (ref 35–144)
BUN: 19 mg/dL (ref 7–25)
CO2: 27 mmol/L (ref 20–32)
Calcium: 9.4 mg/dL (ref 8.6–10.3)
Chloride: 103 mmol/L (ref 98–110)
Creat: 1.26 mg/dL (ref 0.70–1.30)
Globulin: 2.7 g/dL (calc) (ref 1.9–3.7)
Glucose, Bld: 113 mg/dL — ABNORMAL HIGH (ref 65–99)
Potassium: 4.7 mmol/L (ref 3.5–5.3)
Sodium: 138 mmol/L (ref 135–146)
Total Bilirubin: 0.5 mg/dL (ref 0.2–1.2)
Total Protein: 7 g/dL (ref 6.1–8.1)
eGFR: 66 mL/min/{1.73_m2} (ref >=60)

## 2021-11-06 LAB — PSA: PSA: 0.64 ng/mL (ref <=4.00)

## 2021-11-06 LAB — LIPID PANEL
Cholesterol: 181 mg/dL (ref <200)
HDL: 36 mg/dL — ABNORMAL LOW (ref >=40)
LDL Cholesterol (Calc): 119 mg/dL (calc) — ABNORMAL HIGH
Non-HDL Cholesterol (Calc): 145 mg/dL (calc) — ABNORMAL HIGH (ref <130)
Total CHOL/HDL Ratio: 5 (calc) — ABNORMAL HIGH (ref <5.0)
Triglycerides: 150 mg/dL — ABNORMAL HIGH (ref <150)

## 2021-11-06 LAB — HEMOGLOBIN A1C
Hgb A1c MFr Bld: 6.2 % of total Hgb — ABNORMAL HIGH (ref <5.7)
Mean Plasma Glucose: 131 mg/dL
eAG (mmol/L): 7.3 mmol/L

## 2021-11-06 LAB — TSH: TSH: 3.13 mIU/L (ref 0.40–4.50)

## 2021-11-07 NOTE — Telephone Encounter (Signed)
Requested medication (s) are due for refill today: yes  Requested medication (s) are on the active medication list: yes  Last refill:  07/31/21  Future visit scheduled: 11/12/21  Notes to clinic:  taking med for back pain, last addressed 10/09/20, please assess.  Requested Prescriptions  Pending Prescriptions Disp Refills   venlafaxine XR (EFFEXOR-XR) 150 MG 24 hr capsule [Pharmacy Med Name: VENLAFAXINE 150MG  ER CAPSULES] 90 capsule 3    Sig: TAKE 1 CAPSULE(150 MG) BY MOUTH DAILY WITH BREAKFAST     Psychiatry: Antidepressants - SNRI - desvenlafaxine & venlafaxine Failed - 11/06/2021 12:51 PM      Failed - LDL in normal range and within 360 days    LDL Cholesterol (Calc)  Date Value Ref Range Status  11/05/2021 119 (H) mg/dL (calc) Final    Comment:    Reference range: <100 . Desirable range <100 mg/dL for primary prevention;   <70 mg/dL for patients with CHD or diabetic patients  with > or = 2 CHD risk factors. Marland Kitchen LDL-C is now calculated using the Martin-Hopkins  calculation, which is a validated novel method providing  better accuracy than the Friedewald equation in the  estimation of LDL-C.  Cresenciano Genre et al. Annamaria Helling. 1017;510(25): 2061-2068  (http://education.[facility].com/faq/FAQ164)           Failed - Triglycerides in normal range and within 360 days    Triglycerides  Date Value Ref Range Status  11/05/2021 150 (H) <150 mg/dL Final          Passed - Total Cholesterol in normal range and within 360 days    Cholesterol  Date Value Ref Range Status  11/05/2021 181 <200 mg/dL Final          Passed - Last BP in normal range    BP Readings from Last 1 Encounters:  05/12/21 135/84          Passed - Valid encounter within last 6 months    Recent Outpatient Visits           5 months ago Pre-diabetes   [place], DO   12 months ago Annual physical exam   [place], DO   1  year ago Chronic bilateral low back pain with bilateral sciatica   [place], DO   1 year ago Acute pain of right thigh   [facility] Olin Hauser, DO   2 years ago Annual physical exam   [place], Devonne Doughty, DO       Future Appointments             In 5 days Parks Ranger, Devonne Doughty, [facility], [facility]

## 2021-11-12 ENCOUNTER — Ambulatory Visit (INDEPENDENT_AMBULATORY_CARE_PROVIDER_SITE_OTHER): Payer: BC Managed Care – PPO | Admitting: Family Medicine

## 2021-11-12 ENCOUNTER — Other Ambulatory Visit: Payer: Self-pay

## 2021-11-12 ENCOUNTER — Encounter: Payer: Self-pay | Admitting: Family Medicine

## 2021-11-12 VITALS — BP 138/84 | HR 93 | Ht 74.0 in | Wt 223.6 lb

## 2021-11-12 DIAGNOSIS — Z23 Encounter for immunization: Secondary | ICD-10-CM | POA: Diagnosis not present

## 2021-11-12 DIAGNOSIS — J432 Centrilobular emphysema: Secondary | ICD-10-CM

## 2021-11-12 DIAGNOSIS — Z0000 Encounter for general adult medical examination without abnormal findings: Secondary | ICD-10-CM

## 2021-11-12 DIAGNOSIS — R7303 Prediabetes: Secondary | ICD-10-CM | POA: Diagnosis not present

## 2021-11-12 DIAGNOSIS — E782 Mixed hyperlipidemia: Secondary | ICD-10-CM | POA: Diagnosis not present

## 2021-11-12 NOTE — Patient Instructions (Addendum)
Thank you for coming to the office today.  Recent Labs    05/12/21 1520 11/05/21 0818  HGBA1C 6.0* 6.2*   Shingles vaccine today, next in 2-6 months, can do nurse visit.  Upcoming Lung CA Screening CT.  Goal to chip away at smoking, to help keep BP in control as well  Varenicline - Chantix Dosing: Duration Dosage  Initial 3 days  0.5mg  daily  Days 4-7 0.5mg  twice daily  Day 8 through the end of therapy  1mg  twice daily   Prescribing Instructions: Use of Chantix - Starting Month Pak and Chantix - Continuing Month Pak provide easy to use blister packaging.  Black Box Warning: Mood Change -Serious neuropsychiatric events have been reported. Stop Drug and contact health care provider if patient agitation, hostility, depressed mood or changes in thinking or behavior that are not typical for the patient are observed, or if the patient develops suicidal ideation / behavior. Document potential mood change.  Patients can continue to smoke while initiating varenicline. A target quit date should be set on calendar approximately Day 8 to max Day 30 (of the treatment) - PREFERRED QUIT DATE - 1 to 2 weeks from start of medicine. Advise to take this medication WITH food.  Minimizing the nausea (typically mild and transient).  If nausea occurs, consider dose reduction or temporary cessation of treatment.   The treatment should be continued for 12 weeks.  An additional 12 weeks of therapy can be used at the prescribers discretion.   Chantix can change the way people react to alcohol (decreased tolerance, increased drunkenness, unusual or aggressive behavior, memory loss) Seizures occurred in patients that had no history of seizures and in patients that had a seizure disorder that had been well controlled  Discuss Cardiovascular Safety   Please schedule a Follow-up Appointment to: Return in about 6 months (around 05/12/2022) for 6 month PreDM A1c.  If you have any other questions or  concerns, please feel free to call the office or send a message through [place]. You may also schedule an earlier appointment if necessary.  Additionally, you may be receiving a survey about your experience at our office within a few days to 1 week by e-mail or mail. We value your feedback.  Nobie Putnam, DO [place]

## 2021-11-12 NOTE — Progress Notes (Signed)
Subjective:    Patient ID: Seth [name], male    DOB: 07/11/1961, 61 y.o.   MRN: [id number]  Seth [name] is a 61 y.o. male presenting on 11/12/2021 for Annual Exam   HPI  Here for Annual Physical and Lab Review.  Pre-Diabetes Overweight BMI 28 A1c up to 6.2, previous range 6.0 to 6.4 He has reduced sodas significantly, avoiding fast food, and improved dinner meals cooked Reduced carbs/starches CBGs: None Meds: Never on Currently NOT ACEi / ARB Lifestyle: - Diet (limits carbs starches, drinks mostly water - but now he admits eating phase of more starches carbs, he says long hour work shift, often skips meal during long shift then has larger meal later) - Exercise (Limited regular exercise, active working as Dealer) Denies hypoglycemia  Allergic or seasonal Sinusitis Has flonase, best result   HYPERLIPIDEMIA: - Reports no concerns. Last lipid 11/2021, with abnormal results with low HDL and elevated LDL up to 119. Total Cholesterol 181, TG 150. - No cholesterol medicine - He is fasting today only had black coffee, ready for labs   Elevated Creatinine Lab Cr improved to 1.26 Improved hydration  COPD / TOBACCO ABUSE / Pulmonary Nodules (Left Lower Lobe) Allergic Rhinosinusitis - Active smoker, 1ppd for up to 15 years - Previously quit for 10+ years, then restarted.  He quit cold [place] before. In past tried NRT with limited results - Not ready to quit - Previous dx of COPD with emphysema, without known complication. He does not use inhalers and no recent flare up - Last imaging 04/10/15 CT wo contrast chest - showed scattered pulmonary nodules, largest up to 76m - recommended repeat CT now scheduled in January 2023 - He is using Flonase regularly and breathe right strips   FOLLOW-UP DJD in Cervical and Lumbar Spine / Osteoarthritis in joints hands/knees etc [facility] Dr MPhylliss Bob recent Lumbar R side microdiscectomy 04/07/18, see op report  in chart review. He is doing well post op overall has had some issues and still some problems with generalized aches and pains from arthritis in other joints. Back is improved but still causes some problems. - Describes chronic wear and tear from occupation and old injuries - He has chronic intermittent pain sometimes in neck and upper extremity R > L with some paresthesia and tingling at times, only intermittent with flares - Currently taking Tramadol 529mTID - neck and back [facility] - for 10 years for neck - Had recent Lumbar MRI. Dx DDD disc - Also taking Venlafaxine XR 15030m 24 hr for low back pain R side upper leg at times with radiation of pain and tightening up      Health Maintenance: UTD Flu vaccine   - UTD Hep C negative 2018   - Colon CA Screening: Last Colonoscopy 07/16/14 ([facility] Dr PauVerdie Shireresults with no polyps, good for 10 years, due in 07/2024. Currently asymptomatic. No known family history of colon CA   - Prostate CA Screening: Last PSA 0.64 (11/2021) prior 0.6 to 1. Currently asymptomatic. No known family history of prostate CA. Due for screening PSA  [facility] 11/12/2021 10/09/2020 01/25/2020  Decreased Interest 0 0 0  Down, Depressed, Hopeless 0 0 0  PHQ - 2 Score 0 0 0  Altered sleeping 1 - -  Tired, decreased energy 1 - -  Change in appetite 0 - -  Feeling  bad or failure about yourself  0 - -  Trouble concentrating 1 - -  Moving slowly or fidgety/restless 0 - -  Suicidal thoughts 0 - -  PHQ-9 Score 3 - -  Difficult doing work/chores Not difficult at all - -    Past Medical History:  Diagnosis Date   Degenerative disc disease, cervical    Degenerative disc disease, lumbar    GERD (gastroesophageal reflux disease)    Leg pain    Neck pain    Wears partial dentures    Past Surgical History:  Procedure Laterality Date   BLADDER SURGERY  2002   removed  scar tissue   CHOLECYSTECTOMY     COLONOSCOPY     LUMBAR LAMINECTOMY/DECOMPRESSION MICRODISCECTOMY N/A 04/07/2018   Procedure: RIGHT - SIDED LUMBAR FOUR- FIVE MICRODISECTOMY.;  Surgeon: Phylliss Bob, MD;  Location: [facility];  Service: Orthopedics;  Laterality: N/A;  RIGHT - SIDED LUMBAR FOUR- FIVE MICRODISECTOMY.     MULTIPLE TOOTH EXTRACTIONS     UPPER EXTREMITY ANGIOGRAPHY Left 01/02/2019   Procedure: UPPER EXTREMITY ANGIOGRAPHY;  Surgeon: Algernon Huxley, MD;  Location: [facility];  Service: Cardiovascular;  Laterality: Left;   Social History   Socioeconomic History   Marital status: Married    Spouse name: Not on file   Number of children: Not on file   Years of education: Not on file   Highest education level: Not on file  Occupational History   Not on file  Tobacco Use   Smoking status: Every Day    Packs/day: 1.00    Years: 30.00    Pack years: 30.00    Types: Cigarettes   Smokeless tobacco: Never  Vaping Use   Vaping Use: Former  Substance and Sexual Activity   Alcohol use: Yes    Comment: occasionally   Drug use: No   Sexual activity: Yes  Other Topics Concern   Not on file  Social History Narrative   Not on file   Social Determinants of Health   Financial Resource Strain: Not on file  Food Insecurity: Not on file  Transportation Needs: Not on file  Physical Activity: Not on file  Stress: Not on file  Social Connections: Not on file  Intimate Partner Violence: Not on file   Family History  Problem Relation Age of Onset   Diabetes Mother    Cancer Mother        bone   Cancer Father    Heart disease Father    Current Outpatient Medications on File Prior to Visit  Medication Sig   albuterol (PROVENTIL HFA;VENTOLIN HFA) 108 (90 Base) MCG/ACT inhaler Inhale 2 puffs into the lungs every 4 (four) hours as needed for wheezing or shortness of breath (cough).   baclofen (LIORESAL) 10 MG tablet    fluticasone (FLONASE) 50 MCG/ACT nasal spray Place 2 sprays  into both nostrils daily. Use for 4-6 weeks then stop and use seasonally or as needed.   ibuprofen (ADVIL,MOTRIN) 800 MG tablet    NICORETTE 4 MG gum SMARTSIG:1 Gum By Mouth Daily   pentoxifylline (TRENTAL) 400 MG CR tablet Take 1 tablet (400 mg total) by mouth 3 (three) times daily with meals.   traMADol (ULTRAM) 50 MG tablet Take 50 mg by mouth 4 (four) times daily.   venlafaxine XR (EFFEXOR-XR) 150 MG 24 hr capsule TAKE 1 CAPSULE(150 MG) BY MOUTH DAILY WITH BREAKFAST   No current facility-administered medications on file prior to visit.    Review of  Systems  Constitutional:  Negative for activity change, appetite change, chills, diaphoresis, fatigue and fever.  HENT:  Negative for congestion and hearing loss.   Eyes:  Negative for visual disturbance.  Respiratory:  Negative for cough, chest tightness, shortness of breath and wheezing.   Cardiovascular:  Negative for chest pain, palpitations and leg swelling.  Gastrointestinal:  Negative for abdominal pain, constipation, diarrhea, nausea and vomiting.  Genitourinary:  Negative for dysuria, frequency and hematuria.  Musculoskeletal:  Negative for arthralgias and neck pain.  Skin:  Negative for rash.  Neurological:  Negative for dizziness, weakness, light-headedness, numbness and headaches.  Hematological:  Negative for adenopathy.  Psychiatric/Behavioral:  Negative for behavioral problems, dysphoric mood and sleep disturbance.   Per HPI unless specifically indicated above      Objective:    BP 138/84 (BP Location: Left Arm, Cuff Size: Normal)    Pulse 93    Ht '6\' 2"'  (1.88 m)    Wt 223 lb 9.6 oz (101.4 kg)    SpO2 96%    BMI 28.71 kg/m   Wt Readings from Last 3 Encounters:  11/12/21 223 lb 9.6 oz (101.4 kg)  05/12/21 217 lb (98.4 kg)  01/20/21 219 lb (99.3 kg)    Physical Exam Vitals and nursing note reviewed.  Constitutional:      General: He is not in acute distress.    Appearance: He is well-developed. He is not  diaphoretic.     Comments: Well-appearing, comfortable, cooperative  HENT:     Head: Normocephalic and atraumatic.  Eyes:     General:        Right eye: No discharge.        Left eye: No discharge.     Conjunctiva/sclera: Conjunctivae normal.     Pupils: Pupils are equal, round, and reactive to light.  Neck:     Thyroid: No thyromegaly.     Vascular: No carotid bruit.  Cardiovascular:     Rate and Rhythm: Normal rate and regular rhythm.     Pulses: Normal pulses.     Heart sounds: Normal heart sounds. No murmur heard. Pulmonary:     Effort: Pulmonary effort is normal. No respiratory distress.     Breath sounds: Normal breath sounds. No wheezing or rales.  Abdominal:     General: Bowel sounds are normal. There is no distension.     Palpations: Abdomen is soft. There is no mass.     Tenderness: There is no abdominal tenderness.  Musculoskeletal:        General: No tenderness. Normal range of motion.     Cervical back: Normal range of motion and neck supple.     Right lower leg: No edema.     Left lower leg: No edema.     Comments: Upper / Lower Extremities: - Normal muscle tone, strength bilateral upper extremities 5/5, lower extremities 5/5  Lymphadenopathy:     Cervical: No cervical adenopathy.  Skin:    General: Skin is warm and dry.     Findings: No erythema or rash.  Neurological:     Mental Status: He is alert and oriented to person, place, and time.     Comments: Distal sensation intact to light touch all extremities  Psychiatric:        Mood and Affect: Mood normal.        Behavior: Behavior normal.        Thought Content: Thought content normal.     Comments: Well groomed, good eye  contact, normal speech and thoughts   Results for orders placed or performed in visit on 11/04/21  TSH  Result Value Ref Range   TSH 3.13 0.40 - 4.50 mIU/L  PSA  Result Value Ref Range   PSA 0.64 < OR = 4.00 ng/mL  Hemoglobin A1c  Result Value Ref Range   Hgb A1c MFr Bld 6.2 (H)  <5.7 % of total Hgb   Mean Plasma Glucose 131 mg/dL   eAG (mmol/L) 7.3 mmol/L  CBC with Differential/Platelet  Result Value Ref Range   WBC 8.1 3.8 - 10.8 Thousand/uL   RBC 4.94 4.20 - 5.80 Million/uL   Hemoglobin 15.0 13.2 - 17.1 g/dL   HCT 44.7 38.5 - 50.0 %   MCV 90.5 80.0 - 100.0 fL   MCH 30.4 27.0 - 33.0 pg   MCHC 33.6 32.0 - 36.0 g/dL   RDW 12.5 11.0 - 15.0 %   Platelets 307 140 - 400 Thousand/uL   MPV 10.0 7.5 - 12.5 fL   Neutro Abs 4,585 1,500 - 7,800 cells/uL   Lymphs Abs 2,600 850 - 3,900 cells/uL   Absolute Monocytes 575 200 - 950 cells/uL   Eosinophils Absolute 227 15 - 500 cells/uL   Basophils Absolute 113 0 - 200 cells/uL   Neutrophils Relative % 56.6 %   Total Lymphocyte 32.1 %   Monocytes Relative 7.1 %   Eosinophils Relative 2.8 %   Basophils Relative 1.4 %  Lipid panel  Result Value Ref Range   Cholesterol 181 <200 mg/dL   HDL 36 (L) > OR = 40 mg/dL   Triglycerides 150 (H) <150 mg/dL   LDL Cholesterol (Calc) 119 (H) mg/dL (calc)   Total CHOL/HDL Ratio 5.0 (H) <5.0 (calc)   Non-HDL Cholesterol (Calc) 145 (H) <130 mg/dL (calc)  COMPLETE METABOLIC PANEL WITH GFR  Result Value Ref Range   Glucose, Bld 113 (H) 65 - 99 mg/dL   BUN 19 7 - 25 mg/dL   Creat 1.26 0.70 - 1.30 mg/dL   eGFR 66 > OR = 60 mL/min/1.75m   BUN/Creatinine Ratio NOT APPLICABLE 6 - 22 (calc)   Sodium 138 135 - 146 mmol/L   Potassium 4.7 3.5 - 5.3 mmol/L   Chloride 103 98 - 110 mmol/L   CO2 27 20 - 32 mmol/L   Calcium 9.4 8.6 - 10.3 mg/dL   Total Protein 7.0 6.1 - 8.1 g/dL   Albumin 4.3 3.6 - 5.1 g/dL   Globulin 2.7 1.9 - 3.7 g/dL (calc)   AG Ratio 1.6 1.0 - 2.5 (calc)   Total Bilirubin 0.5 0.2 - 1.2 mg/dL   Alkaline phosphatase (APISO) 63 35 - 144 U/L   AST 13 10 - 35 U/L   ALT 13 9 - 46 U/L      Assessment & Plan:   Problem List Items Addressed This Visit     Pre-diabetes    Stable PreDM A1c 6.2 Improving diet lifestyle   Plan:  1. Not on any therapy currently - future  reconsider offer metformin low dose 2. Encourage improved lifestyle - low carb, low sugar diet, reduce portion size, continue improving regular exercise       Hyperlipidemia    Reviewed labs 11/2021 The 10-year ASCVD risk score (Arnett DK, et al., 2019) is: 19.4%  Plan: 1. Discussed ASCVD risk reduction w/ statin - again declined at this time. 2. Encourage improved lifestyle - low carb/cholesterol, reduce portion size, continue improving regular exercise      Centrilobular emphysema (  [place])    Stable COPD without flare up exac Active smoker, chronic tobacco abuse Not on maintenance inhalers  Smoking cessation, not ready to quit Pursue Low dose CT lung scan repeat Follow-up in future may benefit from PFTs staging severity, maintenance therapy      Other Visit Diagnoses     Annual physical exam    -  Primary   Need for shingles vaccine       Relevant Orders   Varicella-zoster vaccine IM (Completed)       Updated Health Maintenance information Next Colon CA Screening 2025 Shingrix dose#1 today then 3 months nurse visit dose #2 Due LDCT screening Reviewed recent lab results with patient Encouraged improvement to lifestyle with diet and exercise Goal of weight loss  #Tobacco Counseling he is not ready to quit, did not pursue active tobacco cessation today.   No orders of the defined types were placed in this encounter.     Follow up plan: Return in about 6 months (around 05/12/2022) for return 3 months nurse visit Shingrix #2, then 6 month PreDM A1c.  Nobie Putnam, [facility] 11/12/2021, 8:27 AM

## 2021-11-12 NOTE — Assessment & Plan Note (Signed)
Stable COPD without flare up exac Active smoker, chronic tobacco abuse Not on maintenance inhalers  Smoking cessation, not ready to quit Pursue Low dose CT lung scan repeat Follow-up in future may benefit from PFTs staging severity, maintenance therapy

## 2021-11-12 NOTE — Assessment & Plan Note (Signed)
Reviewed labs 11/2021 The 10-year ASCVD risk score (Arnett DK, et al., 2019) is: 19.4%  Plan: 1. Discussed ASCVD risk reduction w/ statin - again declined at this time. 2. Encourage improved lifestyle - low carb/cholesterol, reduce portion size, continue improving regular exercise

## 2021-11-12 NOTE — Assessment & Plan Note (Signed)
Stable PreDM A1c 6.2 Improving diet lifestyle   Plan:  1. Not on any therapy currently - future reconsider offer metformin low dose 2. Encourage improved lifestyle - low carb, low sugar diet, reduce portion size, continue improving regular exercise

## 2022-02-10 ENCOUNTER — Telehealth: Payer: Self-pay | Admitting: *Deleted

## 2022-02-10 NOTE — Telephone Encounter (Signed)
LMTC to schedule Yearly Lung CA CT Scan. 

## 2022-02-13 ENCOUNTER — Ambulatory Visit (INDEPENDENT_AMBULATORY_CARE_PROVIDER_SITE_OTHER): Payer: BC Managed Care – PPO

## 2022-02-13 DIAGNOSIS — Z23 Encounter for immunization: Secondary | ICD-10-CM | POA: Diagnosis not present

## 2022-04-29 ENCOUNTER — Other Ambulatory Visit: Payer: Self-pay

## 2022-04-29 DIAGNOSIS — F17210 Nicotine dependence, cigarettes, uncomplicated: Secondary | ICD-10-CM

## 2022-04-29 DIAGNOSIS — Z87891 Personal history of nicotine dependence: Secondary | ICD-10-CM

## 2022-04-29 DIAGNOSIS — Z122 Encounter for screening for malignant neoplasm of respiratory organs: Secondary | ICD-10-CM

## 2022-05-11 ENCOUNTER — Ambulatory Visit
Admission: RE | Admit: 2022-05-11 | Discharge: 2022-05-11 | Disposition: A | Discharge status: Home / Self Care | Payer: BC Managed Care – PPO | Source: Ambulatory Visit | Attending: Acute Care | Admitting: Acute Care

## 2022-05-11 DIAGNOSIS — Z87891 Personal history of nicotine dependence: Secondary | ICD-10-CM | POA: Diagnosis present

## 2022-05-11 DIAGNOSIS — Z122 Encounter for screening for malignant neoplasm of respiratory organs: Secondary | ICD-10-CM | POA: Insufficient documentation

## 2022-05-11 DIAGNOSIS — F17210 Nicotine dependence, cigarettes, uncomplicated: Secondary | ICD-10-CM | POA: Insufficient documentation

## 2022-05-12 ENCOUNTER — Other Ambulatory Visit: Payer: Self-pay

## 2022-05-12 DIAGNOSIS — Z87891 Personal history of nicotine dependence: Secondary | ICD-10-CM

## 2022-05-12 DIAGNOSIS — Z122 Encounter for screening for malignant neoplasm of respiratory organs: Secondary | ICD-10-CM

## 2022-05-12 DIAGNOSIS — F17210 Nicotine dependence, cigarettes, uncomplicated: Secondary | ICD-10-CM

## 2022-05-15 ENCOUNTER — Ambulatory Visit: Payer: BC Managed Care – PPO | Admitting: Family Medicine

## 2022-05-25 ENCOUNTER — Ambulatory Visit: Payer: BC Managed Care – PPO | Admitting: Family Medicine

## 2022-08-31 ENCOUNTER — Encounter (INDEPENDENT_AMBULATORY_CARE_PROVIDER_SITE_OTHER): Payer: Self-pay

## 2022-11-17 ENCOUNTER — Encounter: Payer: BC Managed Care – PPO | Admitting: Family Medicine

## 2022-11-25 ENCOUNTER — Encounter: Payer: Self-pay | Admitting: Family Medicine

## 2022-11-25 ENCOUNTER — Ambulatory Visit (INDEPENDENT_AMBULATORY_CARE_PROVIDER_SITE_OTHER): Payer: BC Managed Care – PPO | Admitting: Family Medicine

## 2022-11-25 VITALS — BP 120/88 | HR 88 | Ht 74.0 in | Wt 217.2 lb

## 2022-11-25 DIAGNOSIS — R351 Nocturia: Secondary | ICD-10-CM

## 2022-11-25 DIAGNOSIS — E782 Mixed hyperlipidemia: Secondary | ICD-10-CM | POA: Diagnosis not present

## 2022-11-25 DIAGNOSIS — I700 Atherosclerosis of aorta: Secondary | ICD-10-CM

## 2022-11-25 DIAGNOSIS — E559 Vitamin D deficiency, unspecified: Secondary | ICD-10-CM

## 2022-11-25 DIAGNOSIS — M5442 Lumbago with sciatica, left side: Secondary | ICD-10-CM

## 2022-11-25 DIAGNOSIS — R7303 Prediabetes: Secondary | ICD-10-CM

## 2022-11-25 DIAGNOSIS — M5441 Lumbago with sciatica, right side: Secondary | ICD-10-CM

## 2022-11-25 DIAGNOSIS — Z6827 Body mass index (BMI) 27.0-27.9, adult: Secondary | ICD-10-CM

## 2022-11-25 DIAGNOSIS — Z0000 Encounter for general adult medical examination without abnormal findings: Secondary | ICD-10-CM

## 2022-11-25 DIAGNOSIS — J432 Centrilobular emphysema: Secondary | ICD-10-CM

## 2022-11-25 DIAGNOSIS — G8929 Other chronic pain: Secondary | ICD-10-CM

## 2022-11-25 MED ORDER — VENLAFAXINE HCL ER 150 MG PO CP24: 150.0000 mg | ORAL_CAPSULE | Freq: Every day | ORAL | 3 refills | Status: DC

## 2022-11-25 NOTE — Patient Instructions (Addendum)
Thank you for coming to the office today.  Last Colonoscopy 07/2014 next due in 10 years or 07/2024 Or we can consider home kit Cologuard good for 3 years.  Refilled Venlafaxine for 1 year  Fasting labs ordered today, stay tuned for results. On MyChart  If A1c >6.5, then you are at risk of type 2 diabetes, goal is to manage the sugar / low carb starches etc back down into Pre Diabetic range, we can double check in 6 months.  If A1c 6.4 or less than you are stable in that same range and goal is to maintain  For smoking  1 [id number]  You tried chantix, but we can try the alternative wellbutrin.   Please schedule a Follow-up Appointment to: Return in about 6 months (around 05/26/2023) for 6 month PreDM A1c, Smoking, COPD.  If you have any other questions or concerns, please feel free to call the office or send a message through [place]. You may also schedule an earlier appointment if necessary.  Additionally, you may be receiving a survey about your experience at our office within a few days to 1 week by e-mail or mail. We value your feedback.  Nobie Putnam, DO [place]

## 2022-11-25 NOTE — Progress Notes (Signed)
Subjective:    Patient ID: Seth [name], male    DOB: 08/26/1961, 62 y.o.   MRN: [id number]  Seth [name] is a 62 y.o. male presenting on 11/25/2022 for Annual Exam   HPI  Here for Annual Physical and Lab Review.   Pre-Diabetes Overweight BMI 28 A1c up to 6.2, previous range 6.0 to 6.4 CBGs: None Meds: Never on med Currently NOT ACEi / ARB Lifestyle: - Diet (limits carbs starches, drinks mostly water - but now he admits eating phase of more starches carbs, he says long hour work shift, often skips meal during long shift then has larger meal later) - Exercise (Limited regular exercise, active working as Dealer) Denies hypoglycemia   Allergic or seasonal Sinusitis Has flonase, best result   HYPERLIPIDEMIA: - Reports no concerns. Last lipid 11/2021, with abnormal results with low HDL and elevated LDL up to 119. Total Cholesterol 181, TG 150. Due for labs today - No cholesterol medicine - He is fasting today only had black coffee, ready for labs   Elevated Creatinine Lab Cr improved to 1.26 Due for repeat lab Improved hydration   COPD / TOBACCO ABUSE / Pulmonary Nodules (Left Lower Lobe) Allergic Rhinosinusitis - Active smoker, 1ppd for up to 15 years - Previously quit for 10+ years, then restarted.  He quit cold [place] before. In past tried NRT with limited results - Not ready to quit - Previous dx of COPD with emphysema, without known complication. He does not use inhalers and no recent flare up Prior LDCT imaging still smoking 1 ppd, goal to quit but unable to now Failed chantix NRT   FOLLOW-UP DJD in Cervical and Lumbar Spine / Osteoarthritis in joints hands/knees etc Followed by [facility], Dr Phylliss Bob, recent Lumbar R side microdiscectomy 04/07/18, see op report in chart review. He is doing well post op overall has had some issues and still some problems with generalized aches and pains from arthritis in other joints. Back is improved but still  causes some problems. - Describes chronic wear and tear from occupation and old injuries - He has chronic intermittent pain sometimes in neck and upper extremity R > L with some paresthesia and tingling at times, only intermittent with flares - Currently taking Tramadol '50mg'$  TID - neck and back - On Baclofen AS NEEDED - Clement Sayres NP at [facility] (Previously [facility]) - for 10 years for neck - Had recent Lumbar MRI. Dx DDD disc - Also taking Venlafaxine XR '150mg'$  - 24 hr for low back pain R side upper leg at times with radiation of pain and tightening up      Health Maintenance: UTD Flu vaccine   - UTD Hep C negative 2018   - Colon CA Screening: Last Colonoscopy 07/16/14 (done by [facility] GI Dr Verdie Shire), results with no polyps, good for 10 years, due in 07/2024. Currently asymptomatic. No known family history of colon CA   - Prostate CA Screening: Last PSA 0.64 (11/2021) prior 0.6 to 1. Currently asymptomatic. No known family history of prostate CA. Due for screening PSA     11/12/2021    8:20 AM 10/09/2020    1:57 PM 01/25/2020    9:24 AM  Depression screen PHQ 2/9  Decreased Interest 0 0 0  Down, Depressed, Hopeless 0 0 0  PHQ - 2 Score 0 0 0  Altered sleeping 1    Tired, decreased energy 1    Change in appetite 0  Feeling bad or failure about yourself  0    Trouble concentrating 1    Moving slowly or fidgety/restless 0    Suicidal thoughts 0    PHQ-9 Score 3    Difficult doing work/chores Not difficult at all     Orders Placed This Encounter  Procedures   COMPLETE METABOLIC PANEL WITH GFR   CBC with Differential/Platelet   Lipid panel    Order Specific Question:   Has the patient fasted?    Answer:   Yes   Hemoglobin A1c   PSA   TSH   VITAMIN D 25 Hydroxy (Vit-D Deficiency, Fractures)     Past Medical History:  Diagnosis Date   Degenerative disc disease, cervical    Degenerative disc disease, lumbar    GERD (gastroesophageal reflux  disease)    Leg pain    Neck pain    Wears partial dentures    Past Surgical History:  Procedure Laterality Date   BLADDER SURGERY  2002   removed scar tissue   CHOLECYSTECTOMY     COLONOSCOPY     LUMBAR LAMINECTOMY/DECOMPRESSION MICRODISCECTOMY N/A 04/07/2018   Procedure: RIGHT - SIDED LUMBAR FOUR- FIVE MICRODISECTOMY.;  Surgeon: Phylliss Bob, MD;  Location: [place];  Service: Orthopedics;  Laterality: N/A;  RIGHT - SIDED LUMBAR FOUR- FIVE MICRODISECTOMY.     MULTIPLE TOOTH EXTRACTIONS     UPPER EXTREMITY ANGIOGRAPHY Left 01/02/2019   Procedure: UPPER EXTREMITY ANGIOGRAPHY;  Surgeon: Algernon Huxley, MD;  Location: [place] [facility];  Service: Cardiovascular;  Laterality: Left;   Social History   Socioeconomic History   Marital status: Married    Spouse name: Not on file   Number of children: Not on file   Years of education: Not on file   Highest education level: Not on file  Occupational History   Not on file  Tobacco Use   Smoking status: Every Day    Packs/day: 1.00    Years: 30.00    Total pack years: 30.00    Types: Cigarettes   Smokeless tobacco: Never  Vaping Use   Vaping Use: Former  Substance and Sexual Activity   Alcohol use: Yes    Comment: occasionally   Drug use: No   Sexual activity: Yes  Other Topics Concern   Not on file  Social History Narrative   Not on file   Social Determinants of Health   Financial Resource Strain: Not on file  Food Insecurity: Not on file  Transportation Needs: Not on file  Physical Activity: Not on file  Stress: Not on file  Social Connections: Not on file  Intimate Partner Violence: Not on file   Family History  Problem Relation Age of Onset   Diabetes Mother    Cancer Mother        bone   Cancer Father    Heart disease Father    Current Outpatient Medications on File Prior to Visit  Medication Sig   albuterol (PROVENTIL HFA;VENTOLIN HFA) 108 (90 Base) MCG/ACT inhaler Inhale 2 puffs into the lungs every 4 (four)  hours as needed for wheezing or shortness of breath (cough).   baclofen (LIORESAL) 10 MG tablet    fluticasone (FLONASE) 50 MCG/ACT nasal spray Place 2 sprays into both nostrils daily. Use for 4-6 weeks then stop and use seasonally or as needed.   ibuprofen (ADVIL,MOTRIN) 800 MG tablet    traMADol (ULTRAM) 50 MG tablet Take 50 mg by mouth 4 (four) times daily.   No  current facility-administered medications on file prior to visit.    Review of Systems  Constitutional:  Negative for activity change, appetite change, chills, diaphoresis, fatigue and fever.  HENT:  Negative for congestion and hearing loss.   Eyes:  Negative for visual disturbance.  Respiratory:  Negative for cough, chest tightness, shortness of breath and wheezing.   Cardiovascular:  Negative for chest pain, palpitations and leg swelling.  Gastrointestinal:  Negative for abdominal pain, constipation, diarrhea, nausea and vomiting.  Genitourinary:  Negative for dysuria, frequency and hematuria.  Musculoskeletal:  Negative for arthralgias and neck pain.  Skin:  Negative for rash.  Neurological:  Negative for dizziness, weakness, light-headedness, numbness and headaches.  Hematological:  Negative for adenopathy.  Psychiatric/Behavioral:  Negative for behavioral problems, dysphoric mood and sleep disturbance.    Per HPI unless specifically indicated above      Objective:    BP 120/88   Pulse 88   Ht '6\' 2"'$  (1.88 m)   Wt 217 lb 3.2 oz (98.5 kg)   SpO2 100%   BMI 27.89 kg/m   Wt Readings from Last 3 Encounters:  11/25/22 217 lb 3.2 oz (98.5 kg)  05/11/22 220 lb (99.8 kg)  11/12/21 223 lb 9.6 oz (101.4 kg)    Physical Exam Vitals and nursing note reviewed.  Constitutional:      General: He is not in acute distress.    Appearance: He is well-developed. He is not diaphoretic.     Comments: Well-appearing, comfortable, cooperative  HENT:     Head: Normocephalic and atraumatic.  Eyes:     General:        Right eye:  No discharge.        Left eye: No discharge.     Conjunctiva/sclera: Conjunctivae normal.     Pupils: Pupils are equal, round, and reactive to light.  Neck:     Thyroid: No thyromegaly.     Vascular: No carotid bruit.  Cardiovascular:     Rate and Rhythm: Normal rate and regular rhythm.     Pulses: Normal pulses.     Heart sounds: Normal heart sounds. No murmur heard. Pulmonary:     Effort: Pulmonary effort is normal. No respiratory distress.     Breath sounds: Normal breath sounds. No wheezing or rales.  Abdominal:     General: Bowel sounds are normal. There is no distension.     Palpations: Abdomen is soft. There is no mass.     Tenderness: There is no abdominal tenderness.  Musculoskeletal:        General: No tenderness. Normal range of motion.     Cervical back: Normal range of motion and neck supple.     Right lower leg: No edema.     Left lower leg: No edema.     Comments: Upper / Lower Extremities: - Normal muscle tone, strength bilateral upper extremities 5/5, lower extremities 5/5  Lymphadenopathy:     Cervical: No cervical adenopathy.  Skin:    General: Skin is warm and dry.     Findings: No erythema or rash.  Neurological:     Mental Status: He is alert and oriented to person, place, and time.     Comments: Distal sensation intact to light touch all extremities  Psychiatric:        Mood and Affect: Mood normal.        Behavior: Behavior normal.        Thought Content: Thought content normal.     Comments:  Well groomed, good eye contact, normal speech and thoughts     I have personally reviewed the radiology report from 05/11/22 on LDCT.  CT CHEST LUNG CA SCREEN LOW DOSE W/O CM [[id number]] Resulted: 05/11/22 1414  Order Status: Completed Updated: 05/11/22 1416  Narrative:    CLINICAL DATA:  Current smoker, 31 pack-year history.  EXAM: CT CHEST WITHOUT CONTRAST LOW-DOSE FOR LUNG CANCER SCREENING  TECHNIQUE: Multidetector CT imaging of the chest was  performed following the standard protocol without IV contrast.  RADIATION DOSE REDUCTION: This exam was performed according to the departmental dose-optimization program which includes automated exposure control, adjustment of the mA and/or kV according to patient size and/or use of iterative reconstruction technique.  COMPARISON:  01/20/2021.  FINDINGS: Cardiovascular: Atherosclerotic calcification of the aorta and coronary arteries. Heart size normal. No pericardial effusion.  Mediastinum/Nodes: No pathologically enlarged mediastinal or axillary lymph nodes. Hilar regions are difficult to definitively evaluate without IV contrast but appear grossly unremarkable. Esophagus is grossly unremarkable.  Lungs/Pleura: Centrilobular and paraseptal emphysema. Mild smoking related respiratory bronchiolitis. Extensive vascular tortuosity and/or arteriovenous malformations simulate pulmonary nodules. No new or suspicious pulmonary nodules. No pleural fluid. Minimal adherent debris in the airway.  Upper Abdomen: Visualized portions of the liver, adrenal glands, kidneys, spleen, pancreas, stomach and bowel are grossly unremarkable.  Musculoskeletal: Degenerative changes in the spine. No worrisome lytic or sclerotic lesions.  IMPRESSION: 1. Lung-RADS 2, benign appearance or behavior. Continue annual screening with low-dose chest CT without contrast in 12 months. 2. Aortic atherosclerosis (ICD10-I70.0). Coronary artery calcification. 3.  Emphysema (ICD10-J43.9).   Electronically Signed   By: Lorin Picket M.D.   On: 05/11/2022 14:14    Results for orders placed or performed in visit on 11/04/21  TSH  Result Value Ref Range   TSH 3.13 0.40 - 4.50 mIU/L  PSA  Result Value Ref Range   PSA 0.64 < OR = 4.00 ng/mL  Hemoglobin A1c  Result Value Ref Range   Hgb A1c MFr Bld 6.2 (H) <5.7 % of total Hgb   Mean Plasma Glucose 131 mg/dL   eAG (mmol/L) 7.3 mmol/L  CBC with  Differential/Platelet  Result Value Ref Range   WBC 8.1 3.8 - 10.8 Thousand/uL   RBC 4.94 4.20 - 5.80 Million/uL   Hemoglobin 15.0 13.2 - 17.1 g/dL   HCT 44.7 38.5 - 50.0 %   MCV 90.5 80.0 - 100.0 fL   MCH 30.4 27.0 - 33.0 pg   MCHC 33.6 32.0 - 36.0 g/dL   RDW 12.5 11.0 - 15.0 %   Platelets 307 140 - 400 Thousand/uL   MPV 10.0 7.5 - 12.5 fL   Neutro Abs 4,585 1,500 - 7,800 cells/uL   Lymphs Abs 2,600 850 - 3,900 cells/uL   Absolute Monocytes 575 200 - 950 cells/uL   Eosinophils Absolute 227 15 - 500 cells/uL   Basophils Absolute 113 0 - 200 cells/uL   Neutrophils Relative % 56.6 %   Total Lymphocyte 32.1 %   Monocytes Relative 7.1 %   Eosinophils Relative 2.8 %   Basophils Relative 1.4 %  Lipid panel  Result Value Ref Range   Cholesterol 181 <200 mg/dL   HDL 36 (L) > OR = 40 mg/dL   Triglycerides 150 (H) <150 mg/dL   LDL Cholesterol (Calc) 119 (H) mg/dL (calc)   Total CHOL/HDL Ratio 5.0 (H) <5.0 (calc)   Non-HDL Cholesterol (Calc) 145 (H) <130 mg/dL (calc)  COMPLETE METABOLIC PANEL WITH GFR  Result Value Ref Range  Glucose, Bld 113 (H) 65 - 99 mg/dL   BUN 19 7 - 25 mg/dL   Creat 1.26 0.70 - 1.30 mg/dL   eGFR 66 > OR = 60 mL/min/1.87m   BUN/Creatinine Ratio NOT APPLICABLE 6 - 22 (calc)   Sodium 138 135 - 146 mmol/L   Potassium 4.7 3.5 - 5.3 mmol/L   Chloride 103 98 - 110 mmol/L   CO2 27 20 - 32 mmol/L   Calcium 9.4 8.6 - 10.3 mg/dL   Total Protein 7.0 6.1 - 8.1 g/dL   Albumin 4.3 3.6 - 5.1 g/dL   Globulin 2.7 1.9 - 3.7 g/dL (calc)   AG Ratio 1.6 1.0 - 2.5 (calc)   Total Bilirubin 0.5 0.2 - 1.2 mg/dL   Alkaline phosphatase (APISO) 63 35 - 144 U/L   AST 13 10 - 35 U/L   ALT 13 9 - 46 U/L      Assessment & Plan:   Problem List Items Addressed This Visit     Centrilobular emphysema (HCC)   Chronic back pain   Relevant Medications   venlafaxine XR (EFFEXOR-XR) 150 MG 24 hr capsule   Hyperlipidemia   Relevant Orders   COMPLETE METABOLIC PANEL WITH GFR   Lipid  panel   TSH   Pre-diabetes   Relevant Orders   Hemoglobin A1c   Other Visit Diagnoses     Annual physical exam    -  Primary   Relevant Orders   COMPLETE METABOLIC PANEL WITH GFR   CBC with Differential/Platelet   Lipid panel   Hemoglobin A1c   PSA   TSH   BMI 27.0-27.9,adult       Aortic atherosclerosis (HCC)       Relevant Orders   CBC with Differential/Platelet   Lipid panel   Nocturia       Relevant Orders   PSA   Vitamin D deficiency       Relevant Orders   VITAMIN D 25 Hydroxy (Vit-D Deficiency, Fractures)       Updated Health Maintenance information Fasting labs today, pending results. Encouraged improvement to lifestyle with diet and exercise Goal of weight loss  Last Colonoscopy 07/2014 next due in 10 years or 07/2024 Or we can consider home kit Cologuard good for 3 years.  Chronic Back Pain SNRI therapy with improvement Refilled Venlafaxine for 1 year  Pre Diabetes Elevated A1c previously 6.0 to 6.4 range Not on therapy Admits not adherent to lifestyle  Discussed risk of progression to DM If A1c >6.5, then you are at risk of type 2 diabetes, goal is to manage the sugar / low carb starches etc back down into Pre Diabetic range, we can double check in 6 months.  If A1c 6.4 or less than you are stable in that same range and goal is to maintain  For smoking cessation Past failed Chantix, NRT Consider Wellbutrin Recommend 1 [id number]  Meds ordered this encounter  Medications   venlafaxine XR (EFFEXOR-XR) 150 MG 24 hr capsule    Sig: Take 1 capsule (150 mg total) by mouth daily with breakfast.    Dispense:  90 capsule    Refill:  3      Follow up plan: Return in about 6 months (around 05/26/2023) for 6 month PreDM A1c, Smoking, COPD.  ANobie Putnam [facility] 11/25/2022, 8:42 AM

## 2022-11-26 LAB — CBC WITH DIFFERENTIAL/PLATELET
Absolute Monocytes: 498 cells/uL (ref 200–950)
Basophils Absolute: 91 cells/uL (ref 0–200)
Basophils Relative: 1.1 %
Eosinophils Absolute: 191 cells/uL (ref 15–500)
Eosinophils Relative: 2.3 %
HCT: 45.2 % (ref 38.5–50.0)
Hemoglobin: 15.7 g/dL (ref 13.2–17.1)
Lymphs Abs: 2366 cells/uL (ref 850–3900)
MCH: 31.3 pg (ref 27.0–33.0)
MCHC: 34.7 g/dL (ref 32.0–36.0)
MCV: 90.2 fL (ref 80.0–100.0)
MPV: 10.5 fL (ref 7.5–12.5)
Monocytes Relative: 6 %
Neutro Abs: 5154 cells/uL (ref 1500–7800)
Neutrophils Relative %: 62.1 %
Platelets: 286 10*3/uL (ref 140–400)
RBC: 5.01 10*6/uL (ref 4.20–5.80)
RDW: 12.6 % (ref 11.0–15.0)
Total Lymphocyte: 28.5 %
WBC: 8.3 10*3/uL (ref 3.8–10.8)

## 2022-11-26 LAB — COMPLETE METABOLIC PANEL WITH GFR
AG Ratio: 1.7 (calc) (ref 1.0–2.5)
ALT: 16 U/L (ref 9–46)
AST: 13 U/L (ref 10–35)
Albumin: 4.4 g/dL (ref 3.6–5.1)
Alkaline phosphatase (APISO): 68 U/L (ref 35–144)
BUN/Creatinine Ratio: 15 (calc) (ref 6–22)
BUN: 20 mg/dL (ref 7–25)
CO2: 28 mmol/L (ref 20–32)
Calcium: 9.4 mg/dL (ref 8.6–10.3)
Chloride: 103 mmol/L (ref 98–110)
Creat: 1.31 mg/dL — ABNORMAL HIGH (ref 0.70–1.30)
Globulin: 2.6 g/dL (calc) (ref 1.9–3.7)
Glucose, Bld: 118 mg/dL — ABNORMAL HIGH (ref 65–99)
Potassium: 4.9 mmol/L (ref 3.5–5.3)
Sodium: 138 mmol/L (ref 135–146)
Total Bilirubin: 0.6 mg/dL (ref 0.2–1.2)
Total Protein: 7 g/dL (ref 6.1–8.1)
eGFR: 63 mL/min/{1.73_m2} (ref >=60)

## 2022-11-26 LAB — LIPID PANEL
Cholesterol: 174 mg/dL (ref <200)
HDL: 38 mg/dL — ABNORMAL LOW (ref >=40)
LDL Cholesterol (Calc): 112 mg/dL (calc) — ABNORMAL HIGH
Non-HDL Cholesterol (Calc): 136 mg/dL (calc) — ABNORMAL HIGH (ref <130)
Total CHOL/HDL Ratio: 4.6 (calc) (ref <5.0)
Triglycerides: 126 mg/dL (ref <150)

## 2022-11-26 LAB — HEMOGLOBIN A1C
Hgb A1c MFr Bld: 6.4 % of total Hgb — ABNORMAL HIGH (ref <5.7)
Mean Plasma Glucose: 137 mg/dL
eAG (mmol/L): 7.6 mmol/L

## 2022-11-26 LAB — TSH: TSH: 2.5 mIU/L (ref 0.40–4.50)

## 2022-11-26 LAB — PSA: PSA: 0.61 ng/mL (ref <=4.00)

## 2022-11-26 LAB — VITAMIN D 25 HYDROXY (VIT D DEFICIENCY, FRACTURES): Vit D, 25-Hydroxy: 19 ng/mL — ABNORMAL LOW (ref 30–100)

## 2022-12-05 ENCOUNTER — Other Ambulatory Visit: Payer: Self-pay | Admitting: Family Medicine

## 2022-12-05 DIAGNOSIS — G8929 Other chronic pain: Secondary | ICD-10-CM

## 2022-12-07 NOTE — Telephone Encounter (Signed)
Requested by interface surescripts. Called pharmacy to verify , patient already picked up refill for 11/25/22.  Requested Prescriptions  Refused Prescriptions Disp Refills   venlafaxine XR (EFFEXOR-XR) 150 MG 24 hr capsule [Pharmacy Med Name: VENLAFAXINE ER '150MG'$  CAPSULES] 90 capsule 3    Sig: TAKE 1 CAPSULE(150 MG) BY MOUTH DAILY WITH BREAKFAST     Psychiatry: Antidepressants - SNRI - desvenlafaxine & venlafaxine Failed - 12/05/2022  3:12 AM      Failed - Cr in normal range and within 360 days    Creat  Date Value Ref Range Status  11/25/2022 1.31 (H) 0.70 - 1.30 mg/dL Final         Failed - Lipid Panel in normal range within the last 12 months    Cholesterol  Date Value Ref Range Status  11/25/2022 174 <200 mg/dL Final   LDL Cholesterol (Calc)  Date Value Ref Range Status  11/25/2022 112 (H) mg/dL (calc) Final    Comment:    Reference range: <100 . Desirable range <100 mg/dL for primary prevention;   <70 mg/dL for patients with CHD or diabetic patients  with > or = 2 CHD risk factors. Marland Kitchen LDL-C is now calculated using the Martin-Hopkins  calculation, which is a validated novel method providing  better accuracy than the Friedewald equation in the  estimation of LDL-C.  Cresenciano Genre et al. Annamaria Helling. 8563;149(70): 2061-2068  (http://education.[facility].com/faq/FAQ164)    HDL  Date Value Ref Range Status  11/25/2022 38 (L) > OR = 40 mg/dL Final   Triglycerides  Date Value Ref Range Status  11/25/2022 126 <150 mg/dL Final         Passed - Last BP in normal range    BP Readings from Last 1 Encounters:  11/25/22 120/88         Passed - Valid encounter within last 6 months    Recent Outpatient Visits           1 week ago Annual physical exam   [facility] Parks Ranger, Devonne Doughty, DO   1 year ago Annual physical exam   [facility] Olin Hauser, DO   1 year ago [facility] Olin Hauser, DO   2 years ago Annual physical exam   [facility] Olin Hauser, DO   2 years ago Chronic bilateral low back pain with bilateral sciatica   [facility] Olin Hauser, DO       Future Appointments             In 5 months Parks Ranger, Devonne Doughty, [facility], [facility]

## 2022-12-07 NOTE — Telephone Encounter (Signed)
[employer] to verify pt picked up medication. Pharmacy staff reports patient picked up medication.

## 2023-04-05 ENCOUNTER — Other Ambulatory Visit: Payer: Self-pay | Admitting: Acute Care

## 2023-04-05 DIAGNOSIS — Z87891 Personal history of nicotine dependence: Secondary | ICD-10-CM

## 2023-04-05 DIAGNOSIS — Z122 Encounter for screening for malignant neoplasm of respiratory organs: Secondary | ICD-10-CM

## 2023-04-05 DIAGNOSIS — F17210 Nicotine dependence, cigarettes, uncomplicated: Secondary | ICD-10-CM

## 2023-05-13 ENCOUNTER — Ambulatory Visit
Admission: RE | Admit: 2023-05-13 | Discharge: 2023-05-13 | Disposition: A | Discharge status: Home / Self Care | Payer: BC Managed Care – PPO | Source: Ambulatory Visit | Attending: Acute Care | Admitting: Acute Care

## 2023-05-13 DIAGNOSIS — F17210 Nicotine dependence, cigarettes, uncomplicated: Secondary | ICD-10-CM | POA: Diagnosis present

## 2023-05-13 DIAGNOSIS — Z87891 Personal history of nicotine dependence: Secondary | ICD-10-CM | POA: Diagnosis present

## 2023-05-13 DIAGNOSIS — Z122 Encounter for screening for malignant neoplasm of respiratory organs: Secondary | ICD-10-CM | POA: Diagnosis not present

## 2023-05-17 ENCOUNTER — Other Ambulatory Visit: Payer: Self-pay | Admitting: Acute Care

## 2023-05-17 DIAGNOSIS — Z122 Encounter for screening for malignant neoplasm of respiratory organs: Secondary | ICD-10-CM

## 2023-05-17 DIAGNOSIS — Z87891 Personal history of nicotine dependence: Secondary | ICD-10-CM

## 2023-05-17 DIAGNOSIS — F17210 Nicotine dependence, cigarettes, uncomplicated: Secondary | ICD-10-CM

## 2023-05-26 ENCOUNTER — Ambulatory Visit: Payer: BC Managed Care – PPO | Admitting: Family Medicine

## 2023-06-01 ENCOUNTER — Encounter: Payer: Self-pay | Admitting: Family Medicine

## 2023-06-01 ENCOUNTER — Ambulatory Visit: Payer: BC Managed Care – PPO | Admitting: Family Medicine

## 2023-06-01 ENCOUNTER — Other Ambulatory Visit: Payer: Self-pay | Admitting: Family Medicine

## 2023-06-01 VITALS — BP 132/84 | HR 96 | Temp 96.4°F | Wt 216.0 lb

## 2023-06-01 DIAGNOSIS — R351 Nocturia: Secondary | ICD-10-CM

## 2023-06-01 DIAGNOSIS — Z0000 Encounter for general adult medical examination without abnormal findings: Secondary | ICD-10-CM

## 2023-06-01 DIAGNOSIS — I700 Atherosclerosis of aorta: Secondary | ICD-10-CM | POA: Diagnosis not present

## 2023-06-01 DIAGNOSIS — E782 Mixed hyperlipidemia: Secondary | ICD-10-CM

## 2023-06-01 DIAGNOSIS — R7303 Prediabetes: Secondary | ICD-10-CM

## 2023-06-01 DIAGNOSIS — E559 Vitamin D deficiency, unspecified: Secondary | ICD-10-CM

## 2023-06-01 DIAGNOSIS — J432 Centrilobular emphysema: Secondary | ICD-10-CM

## 2023-06-01 LAB — POCT GLYCOSYLATED HEMOGLOBIN (HGB A1C): Hemoglobin A1C: 6.2 % — AB (ref 4.0–5.6)

## 2023-06-01 NOTE — Assessment & Plan Note (Addendum)
Identified on LDCT imaging Modify risk factors and cardiovascular prevention

## 2023-06-01 NOTE — Assessment & Plan Note (Signed)
Improved result. Down to 6.2 Stable PreDM A1c 6.2 Improving diet lifestyle  Plan:  1. Not on any therapy currently - future reconsider offer metformin low dose 2. Encourage improved lifestyle - low carb, low sugar diet, reduce portion size, continue improving regular exercise

## 2023-06-01 NOTE — Progress Notes (Signed)
Subjective:    Patient ID: Seth [name], male    DOB: April 09, 1961, 62 y.o.   MRN: [id number]  Seth [name] is a 62 y.o. male presenting on 06/01/2023 for Pre Diabetes   HPI  Pre-Diabetes Overweight BMI 27 Prior range A1c up to 6.4,down to 6.2 Today due for A1c back down to 6.2 CBGs: None Meds: Never on med Currently NOT ACEi / ARB Lifestyle: Admits summer loses more weight, winter tends to gain more - Diet (limits carbs starches, drinks mostly water - but now he admits eating phase of more starches carbs, he says long hour work shift, often skips meal during long shift then has larger meal later) - Exercise (Limited regular exercise, active working as Curator) Denies hypoglycemia   Allergic or seasonal Sinusitis Has flonase, best result   HYPERLIPIDEMIA: - Reports no concerns. Last lipid 11/2022 - No cholesterol medicine   COPD / TOBACCO ABUSE / Pulmonary Nodules (Left Lower Lobe) Allergic Rhinosinusitis - Active smoker, 1ppd for up to 15 years - Previously quit for 10+ years, then restarted.  He quit cold [place] before. In past tried NRT with limited results - Not ready to quit - Previous dx of COPD with emphysema, without known complication. He does not use inhalers and no recent flare up  Last LDCT done, 05/2023, will need yearly. still smoking 1 ppd, goal to quit but unable to now Failed chantix NRT  Aortic Atherosclerosis On last LDCT imaging 05/2023   FOLLOW-UP DJD in Cervical and Lumbar Spine / Osteoarthritis in joints hands/knees etc Followed by [facility], Dr Estill Bamberg, recent Lumbar R side microdiscectomy 04/07/18, see op report in chart review. He is doing well post op overall has had some issues and still some problems with generalized aches and pains from arthritis in other joints. Back is improved but still causes some problems. - Describes chronic wear and tear from occupation and old injuries - He has chronic intermittent pain sometimes  in neck and upper extremity R > L with some paresthesia and tingling at times, only intermittent with flares - Currently taking Tramadol 50mg  TID - neck and back - On Baclofen AS NEEDED - Charlesetta Garibaldi NP at [facility] (Previously [facility]) - for 10 years for neck - Had recent Lumbar MRI. Dx DDD disc - Also taking Venlafaxine XR 150mg  - 24 hr for low back pain R side upper leg at times with radiation of pain and tightening up  Updates He has had RAF ablation on neck for nerve pain He has some episodic sciatica flare up Continues on Tramadol      06/01/2023   11:18 AM 11/12/2021    8:20 AM 10/09/2020    1:57 PM  Depression screen PHQ 2/9  Decreased Interest 0 0 0  Down, Depressed, Hopeless 0 0 0  PHQ - 2 Score 0 0 0  Altered sleeping 0 1   Tired, decreased energy 1 1   Change in appetite 0 0   Feeling bad or failure about yourself  0 0   Trouble concentrating 1 1   Moving slowly or fidgety/restless 0 0   Suicidal thoughts 0 0   PHQ-9 Score 2 3   Difficult doing work/chores Not difficult at all Not difficult at all     Social History   Tobacco Use   Smoking status: Every Day    Current packs/day: 1.00    Average packs/day: 1 pack/day for 30.0 years (30.0 ttl pk-yrs)  Types: Cigarettes   Smokeless tobacco: Never  Vaping Use   Vaping status: Former  Substance Use Topics   Alcohol use: Yes    Comment: occasionally   Drug use: No    Review of Systems Per HPI unless specifically indicated above     Objective:    BP 132/84 (BP Location: Left Arm, Patient Position: Sitting, Cuff Size: Normal)   Pulse 96   Temp (!) 96.4 F (35.8 C) (Temporal)   Wt 216 lb (98 kg)   SpO2 98%   BMI 27.73 kg/m   Wt Readings from Last 3 Encounters:  06/01/23 216 lb (98 kg)  11/25/22 217 lb 3.2 oz (98.5 kg)  05/11/22 220 lb (99.8 kg)    Physical Exam Vitals and nursing note reviewed.  Constitutional:      General: He is not in acute distress.     Appearance: Normal appearance. He is well-developed. He is not diaphoretic.     Comments: Well-appearing, comfortable, cooperative  HENT:     Head: Normocephalic and atraumatic.  Eyes:     General:        Right eye: No discharge.        Left eye: No discharge.     Conjunctiva/sclera: Conjunctivae normal.  Neck:     Thyroid: No thyromegaly.  Cardiovascular:     Rate and Rhythm: Normal rate and regular rhythm.     Pulses: Normal pulses.     Heart sounds: Normal heart sounds. No murmur heard. Pulmonary:     Effort: Pulmonary effort is normal. No respiratory distress.     Breath sounds: Normal breath sounds. No wheezing or rales.  Musculoskeletal:        General: Normal range of motion.     Cervical back: Normal range of motion and neck supple.  Lymphadenopathy:     Cervical: No cervical adenopathy.  Skin:    General: Skin is warm and dry.     Findings: No erythema or rash.  Neurological:     Mental Status: He is alert and oriented to person, place, and time. Mental status is at baseline.  Psychiatric:        Mood and Affect: Mood normal.        Behavior: Behavior normal.        Thought Content: Thought content normal.     Comments: Well groomed, good eye contact, normal speech and thoughts     I have personally reviewed the radiology report from 05/13/23 LDCT.  CT CHEST LUNG CA SCREEN LOW DOSE W/O CM  Narrative: CLINICAL DATA:  62 year old male current smoker with 32 pack-year history of smoking. Lung cancer screening examination.  EXAM: CT CHEST WITHOUT CONTRAST LOW-DOSE FOR LUNG CANCER SCREENING  TECHNIQUE: Multidetector CT imaging of the chest was performed following the standard protocol without IV contrast.  RADIATION DOSE REDUCTION: This exam was performed according to the departmental dose-optimization program which includes automated exposure control, adjustment of the mA and/or kV according to patient size and/or use of iterative reconstruction  technique.  COMPARISON:  Low-dose lung cancer screening chest CT 05/11/2022.  FINDINGS: Cardiovascular: Heart size is normal. There is no significant pericardial fluid, thickening or pericardial calcification. There is aortic atherosclerosis, as well as atherosclerosis of the great vessels of the mediastinum and the coronary arteries, including calcified atherosclerotic plaque in the left anterior descending and right coronary arteries.  Mediastinum/Nodes: No pathologically enlarged mediastinal or hilar lymph nodes. Please note that accurate exclusion of hilar adenopathy is limited on  noncontrast CT scans. Esophagus is unremarkable in appearance. No axillary lymphadenopathy.  Lungs/Pleura: Multiple pulmonary nodules and nodular areas of architectural distortion (many of which appear to represent small pulmonary arteriovenous malformations) are similar to prior studies, largest of which is in the medial aspect of the left lower lobe (axial image 200) with a volume derived mean diameter of 12.5 mm. No other larger more suspicious appearing pulmonary nodules or masses are noted. No acute consolidative airspace disease. No pleural effusions. Diffuse bronchial wall thickening with moderate centrilobular and paraseptal emphysema.  Upper Abdomen: Aortic atherosclerosis.  Musculoskeletal: There are no aggressive appearing lytic or blastic lesions noted in the visualized portions of the skeleton.  IMPRESSION: 1. Lung-RADS 2S, benign appearance or behavior. Continue annual screening with low-dose chest CT without contrast in 12 months. 2. The "S" modifier above refers to potentially clinically significant non lung cancer related findings. Specifically, there is aortic atherosclerosis, in addition to two-vessel coronary artery disease. Please note that although the presence of coronary artery calcium documents the presence of coronary artery disease, the severity of this disease and any  potential stenosis cannot be assessed on this non-gated CT examination. Assessment for potential risk factor modification, dietary therapy or pharmacologic therapy may be warranted, if clinically indicated. 3. Mild diffuse bronchial wall thickening with moderate centrilobular and paraseptal emphysema; imaging findings suggestive of underlying COPD.  Aortic Atherosclerosis (ICD10-I70.0) and Emphysema (ICD10-J43.9).   Electronically Signed   By: Trudie Reed M.D.   On: 05/17/2023 12:15      Results for orders placed or performed in visit on 06/01/23  POCT glycosylated hemoglobin (Hb A1C)  Result Value Ref Range   Hemoglobin A1C 6.2 (A) 4.0 - 5.6 %      Assessment & Plan:   Problem List Items Addressed This Visit     Aortic atherosclerosis (HCC)    Identified on LDCT imaging Modify risk factors and cardiovascular prevention      Pre-diabetes - Primary    Improved result. Down to 6.2 Stable PreDM A1c 6.2 Improving diet lifestyle  Plan:  1. Not on any therapy currently - future reconsider offer metformin low dose 2. Encourage improved lifestyle - low carb, low sugar diet, reduce portion size, continue improving regular exercise       Relevant Orders   POCT glycosylated hemoglobin (Hb A1C) (Completed)     No orders of the defined types were placed in this encounter.    Follow up plan: Return in about 6 months (around 12/02/2023) for 6 month fasting lab then 1 week later Annual Physical AM apt, mid week.  Future labs ordered for 11/2023   Saralyn Pilar, DO [facility] 06/01/2023, 11:23 AM

## 2023-06-01 NOTE — Patient Instructions (Addendum)
Thank you for coming to the office today.  Recent Labs    11/25/22 0915 06/01/23 1122  HGBA1C 6.4* 6.2*   Keep up the good work.  DUE for FASTING BLOOD WORK (no food or drink after midnight before the lab appointment, only water or coffee without cream/sugar on the morning of)  SCHEDULE "Lab Only" visit in the morning at the clinic for lab draw in 6 MONTHS   - Make sure Lab Only appointment is at about 1 week before your next appointment, so that results will be available  For Lab Results, once available within 2-3 days of blood draw, you can can log in to MyChart online to view your results and a brief explanation. Also, we can discuss results at next follow-up visit.   Please schedule a Follow-up Appointment to: Return in about 6 months (around 12/02/2023) for 6 month fasting lab then 1 week later Annual Physical AM apt, mid week.  If you have any other questions or concerns, please feel free to call the office or send a message through MyChart. You may also schedule an earlier appointment if necessary.  Additionally, you may be receiving a survey about your experience at our office within a few days to 1 week by e-mail or mail. We value your feedback.  Saralyn Pilar, DO [facility], [place]

## 2023-06-03 ENCOUNTER — Ambulatory Visit: Payer: BC Managed Care – PPO | Admitting: Podiatry

## 2023-06-03 DIAGNOSIS — Q828 Other specified congenital malformations of skin: Secondary | ICD-10-CM

## 2023-06-03 NOTE — Progress Notes (Signed)
  Subjective:  Patient ID: Seth [name], male    DOB: 1961-10-12,  MRN: [id number]  Chief Complaint  Patient presents with   Callouses    62 y.o. male presents with the above complaint.  Patient presents with left submet 3 and right submet 2 porokeratotic lesion painful to touch is progressive gotten worse worse with ambulation worse with pressure she has not seen MRIs prior to seeing me.  He denies any other acute complaints.  Is been hurting with every step that he takes.  This started all of a sudden.  Pain scale is 5 out of 10   Review of Systems: Negative except as noted in the HPI. Denies N/V/F/Ch.  Past Medical History:  Diagnosis Date   Degenerative disc disease, cervical    Degenerative disc disease, lumbar    GERD (gastroesophageal reflux disease)    Leg pain    Neck pain    Wears partial dentures     Current Outpatient Medications:    albuterol (PROVENTIL HFA;VENTOLIN HFA) 108 (90 Base) MCG/ACT inhaler, Inhale 2 puffs into the lungs every 4 (four) hours as needed for wheezing or shortness of breath (cough)., Disp: 1 Inhaler, Rfl: 0   baclofen (LIORESAL) 10 MG tablet, , Disp: , Rfl:    fluticasone (FLONASE) 50 MCG/ACT nasal spray, Place 2 sprays into both nostrils daily. Use for 4-6 weeks then stop and use seasonally or as needed., Disp: 16 g, Rfl: 3   ibuprofen (ADVIL,MOTRIN) 800 MG tablet, , Disp: , Rfl:    traMADol (ULTRAM) 50 MG tablet, Take 50 mg by mouth 4 (four) times daily., Disp: , Rfl:    venlafaxine XR (EFFEXOR-XR) 150 MG 24 hr capsule, Take 1 capsule (150 mg total) by mouth daily with breakfast., Disp: 90 capsule, Rfl: 3  Social History   Tobacco Use  Smoking Status Every Day   Current packs/day: 1.00   Average packs/day: 1 pack/day for 30.0 years (30.0 ttl pk-yrs)   Types: Cigarettes  Smokeless Tobacco Never    No Known Allergies Objective:  There were no vitals filed for this visit. There is no height or weight on file to calculate  BMI. Constitutional Well developed. Well nourished.  Vascular Dorsalis pedis pulses palpable bilaterally. Posterior tibial pulses palpable bilaterally. Capillary refill normal to all digits.  No cyanosis or clubbing noted. Pedal hair growth normal.  Neurologic Normal speech. Oriented to person, place, and time. Epicritic sensation to light touch grossly present bilaterally.  Dermatologic Nails well groomed and normal in appearance. No open wounds. No skin lesions.  Orthopedic: Hyperkeratotic lesion with central nucleated core noted to the left submetatarsal 3 and right submetatarsal 2.  Pain on palpation to the lesion.  No pinpoint bleeding noted upon debridement.   Radiographs: None Assessment:   1. Porokeratosis    Plan:  Patient was evaluated and treated and all questions answered.  Left submetatarsal 3 and right submetatarsal 2 porokeratosis -All questions and concerns were discussed with the patient in extensive detail given the amount of pain that he is having a benefit from debridement of the lesion using chisel blade handle the lesion was debrided down to healthy dry tissue.  No complication noted no pinpoint bleeding noted. -I discussed shoe gear modification -I would discuss orthotics management if there is improvement and continues to reoccurs.  No follow-ups on file.

## 2023-11-01 ENCOUNTER — Ambulatory Visit: Payer: Self-pay

## 2023-11-01 ENCOUNTER — Other Ambulatory Visit: Payer: Self-pay

## 2023-11-01 ENCOUNTER — Inpatient Hospital Stay
Admission: EM | Admit: 2023-11-01 | Discharge: 2023-11-02 | DRG: 871 | Disposition: A | Discharge status: Home / Self Care | Payer: BC Managed Care – PPO | Attending: Internal Medicine | Admitting: Internal Medicine

## 2023-11-01 ENCOUNTER — Emergency Department: Payer: BC Managed Care – PPO

## 2023-11-01 DIAGNOSIS — Z833 Family history of diabetes mellitus: Secondary | ICD-10-CM | POA: Diagnosis not present

## 2023-11-01 DIAGNOSIS — J189 Pneumonia, unspecified organism: Secondary | ICD-10-CM | POA: Diagnosis present

## 2023-11-01 DIAGNOSIS — Z8249 Family history of ischemic heart disease and other diseases of the circulatory system: Secondary | ICD-10-CM | POA: Diagnosis not present

## 2023-11-01 DIAGNOSIS — Z808 Family history of malignant neoplasm of other organs or systems: Secondary | ICD-10-CM

## 2023-11-01 DIAGNOSIS — M47812 Spondylosis without myelopathy or radiculopathy, cervical region: Secondary | ICD-10-CM | POA: Diagnosis present

## 2023-11-01 DIAGNOSIS — Z79899 Other long term (current) drug therapy: Secondary | ICD-10-CM

## 2023-11-01 DIAGNOSIS — E871 Hypo-osmolality and hyponatremia: Secondary | ICD-10-CM | POA: Diagnosis present

## 2023-11-01 DIAGNOSIS — M47816 Spondylosis without myelopathy or radiculopathy, lumbar region: Secondary | ICD-10-CM | POA: Diagnosis present

## 2023-11-01 DIAGNOSIS — A419 Sepsis, unspecified organism: Secondary | ICD-10-CM | POA: Diagnosis present

## 2023-11-01 DIAGNOSIS — Z1152 Encounter for screening for COVID-19: Secondary | ICD-10-CM | POA: Diagnosis not present

## 2023-11-01 DIAGNOSIS — F32A Depression, unspecified: Secondary | ICD-10-CM | POA: Diagnosis present

## 2023-11-01 DIAGNOSIS — E861 Hypovolemia: Secondary | ICD-10-CM | POA: Diagnosis present

## 2023-11-01 DIAGNOSIS — J441 Chronic obstructive pulmonary disease with (acute) exacerbation: Secondary | ICD-10-CM | POA: Diagnosis present

## 2023-11-01 DIAGNOSIS — J440 Chronic obstructive pulmonary disease with acute lower respiratory infection: Secondary | ICD-10-CM | POA: Diagnosis present

## 2023-11-01 DIAGNOSIS — J9601 Acute respiratory failure with hypoxia: Secondary | ICD-10-CM

## 2023-11-01 DIAGNOSIS — Z972 Presence of dental prosthetic device (complete) (partial): Secondary | ICD-10-CM | POA: Diagnosis not present

## 2023-11-01 DIAGNOSIS — F17210 Nicotine dependence, cigarettes, uncomplicated: Secondary | ICD-10-CM | POA: Diagnosis present

## 2023-11-01 LAB — RESP PANEL BY RT-PCR (RSV, FLU A&B, COVID)  RVPGX2
Influenza A by PCR: NEGATIVE
Influenza B by PCR: NEGATIVE
Resp Syncytial Virus by PCR: NEGATIVE
SARS Coronavirus 2 by RT PCR: NEGATIVE

## 2023-11-01 LAB — CBC
HCT: 46.6 % (ref 39.0–52.0)
Hemoglobin: 16.3 g/dL (ref 13.0–17.0)
MCH: 31.7 pg (ref 26.0–34.0)
MCHC: 35 g/dL (ref 30.0–36.0)
MCV: 90.5 fL (ref 80.0–100.0)
Platelets: 216 10*3/uL (ref 150–400)
RBC: 5.15 MIL/uL (ref 4.22–5.81)
RDW: 12.1 % (ref 11.5–15.5)
WBC: 12.3 10*3/uL — ABNORMAL HIGH (ref 4.0–10.5)
nRBC: 0 % (ref 0.0–0.2)

## 2023-11-01 LAB — TROPONIN I (HIGH SENSITIVITY)
Troponin I (High Sensitivity): 3 ng/L (ref <18)
Troponin I (High Sensitivity): 3 ng/L (ref <18)

## 2023-11-01 LAB — HEPATIC FUNCTION PANEL
ALT: 20 U/L (ref 0–44)
AST: 20 U/L (ref 15–41)
Albumin: 3.8 g/dL (ref 3.5–5.0)
Alkaline Phosphatase: 69 U/L (ref 38–126)
Bilirubin, Direct: 0.3 mg/dL — ABNORMAL HIGH (ref 0.0–0.2)
Indirect Bilirubin: 0.9 mg/dL (ref 0.3–0.9)
Total Bilirubin: 1.2 mg/dL (ref 0.0–1.2)
Total Protein: 7.7 g/dL (ref 6.5–8.1)

## 2023-11-01 LAB — BASIC METABOLIC PANEL
Anion gap: 14 (ref 5–15)
BUN: 18 mg/dL (ref 6–20)
CO2: 21 mmol/L — ABNORMAL LOW (ref 22–32)
Calcium: 8.8 mg/dL — ABNORMAL LOW (ref 8.9–10.3)
Chloride: 98 mmol/L (ref 98–111)
Creatinine, Ser: 1.06 mg/dL (ref 0.61–1.24)
GFR, Estimated: >60 mL/min (ref >60)
Glucose, Bld: 106 mg/dL — ABNORMAL HIGH (ref 70–99)
Potassium: 3.9 mmol/L (ref 3.5–5.1)
Sodium: 133 mmol/L — ABNORMAL LOW (ref 135–145)

## 2023-11-01 LAB — PROCALCITONIN: Procalcitonin: 0.2 ng/mL

## 2023-11-01 LAB — LACTIC ACID, PLASMA: Lactic Acid, Venous: 1.9 mmol/L (ref 0.5–1.9)

## 2023-11-01 MED ORDER — ONDANSETRON HCL 4 MG/2ML IJ SOLN: 4.0000 mg | Freq: Four times a day (QID) | INTRAMUSCULAR | Status: DC | PRN

## 2023-11-01 MED ORDER — SODIUM CHLORIDE 0.9 % IV SOLN
2.0000 g | INTRAVENOUS | Status: DC
  Filled 2023-11-01: qty 20

## 2023-11-01 MED ORDER — IPRATROPIUM-ALBUTEROL 0.5-2.5 (3) MG/3ML IN SOLN
3.0000 mL | Freq: Once | RESPIRATORY_TRACT | Status: AC
Start: 2023-11-01 — End: 2023-11-01
  Administered 2023-11-01: 3 mL via RESPIRATORY_TRACT
  Filled 2023-11-01: qty 3

## 2023-11-01 MED ORDER — MAGNESIUM HYDROXIDE 400 MG/5ML PO SUSP: 30.0000 mL | Freq: Every day | ORAL | Status: DC | PRN

## 2023-11-01 MED ORDER — SODIUM CHLORIDE 0.9 % IV BOLUS
1000.0000 mL | Freq: Once | INTRAVENOUS | Status: AC
  Administered 2023-11-01: 1000 mL via INTRAVENOUS

## 2023-11-01 MED ORDER — GUAIFENESIN ER 600 MG PO TB12
600.0000 mg | ORAL_TABLET | Freq: Two times a day (BID) | ORAL | Status: DC
  Administered 2023-11-01 – 2023-11-02 (×2): 600 mg via ORAL
  Filled 2023-11-01 (×2): qty 1

## 2023-11-01 MED ORDER — METHYLPREDNISOLONE SODIUM SUCC 125 MG IJ SOLR
125.0000 mg | Freq: Once | INTRAMUSCULAR | Status: AC
  Administered 2023-11-01: 125 mg via INTRAVENOUS
  Filled 2023-11-01: qty 2

## 2023-11-01 MED ORDER — METHYLPREDNISOLONE SODIUM SUCC 40 MG IJ SOLR
40.0000 mg | Freq: Two times a day (BID) | INTRAMUSCULAR | Status: DC
  Administered 2023-11-02: 40 mg via INTRAVENOUS
  Filled 2023-11-01: qty 1

## 2023-11-01 MED ORDER — ACETAMINOPHEN 325 MG RE SUPP: 650.0000 mg | Freq: Four times a day (QID) | RECTAL | Status: DC | PRN

## 2023-11-01 MED ORDER — BACLOFEN 10 MG PO TABS
10.0000 mg | ORAL_TABLET | Freq: Two times a day (BID) | ORAL | Status: DC
  Administered 2023-11-01 – 2023-11-02 (×2): 10 mg via ORAL
  Filled 2023-11-01 (×2): qty 1

## 2023-11-01 MED ORDER — IPRATROPIUM-ALBUTEROL 0.5-2.5 (3) MG/3ML IN SOLN
3.0000 mL | Freq: Four times a day (QID) | RESPIRATORY_TRACT | Status: DC
  Administered 2023-11-01 – 2023-11-02 (×3): 3 mL via RESPIRATORY_TRACT
  Filled 2023-11-01 (×3): qty 3

## 2023-11-01 MED ORDER — SODIUM CHLORIDE 0.9 % IV SOLN
500.0000 mg | Freq: Once | INTRAVENOUS | Status: AC
  Administered 2023-11-01: 500 mg via INTRAVENOUS
  Filled 2023-11-01: qty 5

## 2023-11-01 MED ORDER — VENLAFAXINE HCL ER 150 MG PO CP24
150.0000 mg | ORAL_CAPSULE | Freq: Every day | ORAL | Status: DC
  Filled 2023-11-01 (×3): qty 1

## 2023-11-01 MED ORDER — SODIUM CHLORIDE 0.9 % IV SOLN: 500.0000 mg | INTRAVENOUS | Status: DC

## 2023-11-01 MED ORDER — HYDROCOD POLI-CHLORPHE POLI ER 10-8 MG/5ML PO SUER: 5.0000 mL | Freq: Two times a day (BID) | ORAL | Status: DC | PRN

## 2023-11-01 MED ORDER — SODIUM CHLORIDE 0.9 % IV SOLN: 2.0000 g | INTRAVENOUS | Status: DC

## 2023-11-01 MED ORDER — ENOXAPARIN SODIUM 40 MG/0.4ML IJ SOSY
40.0000 mg | PREFILLED_SYRINGE | INTRAMUSCULAR | Status: DC
  Administered 2023-11-01: 40 mg via SUBCUTANEOUS
  Filled 2023-11-01: qty 0.4

## 2023-11-01 MED ORDER — TRAMADOL HCL 50 MG PO TABS
50.0000 mg | ORAL_TABLET | Freq: Once | ORAL | Status: AC
  Administered 2023-11-01: 50 mg via ORAL
  Filled 2023-11-01: qty 1

## 2023-11-01 MED ORDER — TRAMADOL HCL 50 MG PO TABS
50.0000 mg | ORAL_TABLET | Freq: Four times a day (QID) | ORAL | Status: DC
  Administered 2023-11-02 (×2): 50 mg via ORAL
  Filled 2023-11-01 (×3): qty 1

## 2023-11-01 MED ORDER — LACTATED RINGERS IV SOLN
150.0000 mL/h | INTRAVENOUS | Status: DC
Start: 2023-11-01 — End: 2023-11-02
  Administered 2023-11-01 – 2023-11-02 (×2): 150 mL/h via INTRAVENOUS

## 2023-11-01 MED ORDER — SODIUM CHLORIDE 0.9 % IV SOLN
500.0000 mg | INTRAVENOUS | Status: DC
  Filled 2023-11-01: qty 5

## 2023-11-01 MED ORDER — IPRATROPIUM-ALBUTEROL 0.5-2.5 (3) MG/3ML IN SOLN
3.0000 mL | Freq: Once | RESPIRATORY_TRACT | Status: AC
  Administered 2023-11-01: 3 mL via RESPIRATORY_TRACT
  Filled 2023-11-01: qty 3

## 2023-11-01 MED ORDER — ONDANSETRON HCL 4 MG PO TABS: 4.0000 mg | ORAL_TABLET | Freq: Four times a day (QID) | ORAL | Status: DC | PRN

## 2023-11-01 MED ORDER — ACETAMINOPHEN 325 MG PO TABS: 650.0000 mg | ORAL_TABLET | Freq: Four times a day (QID) | ORAL | Status: DC | PRN

## 2023-11-01 MED ORDER — SODIUM CHLORIDE 0.9 % IV SOLN
2.0000 g | Freq: Once | INTRAVENOUS | Status: AC
  Administered 2023-11-01: 2 g via INTRAVENOUS
  Filled 2023-11-01: qty 20

## 2023-11-01 MED ORDER — TRAZODONE HCL 50 MG PO TABS: 25.0000 mg | ORAL_TABLET | Freq: Every evening | ORAL | Status: DC | PRN

## 2023-11-01 NOTE — Assessment & Plan Note (Signed)
-   The patient will be admitted to a progressive unit bed - Will continue antibiotic therapy with IV Rocephin and Zithromax. - Mucolytic therapy be provided as well as duo nebs q.i.d. and q.4 hours p.r.n. - We will follow blood cultures.

## 2023-11-01 NOTE — ED Provider Notes (Signed)
[facility] Provider Note    Event Date/Time   First MD Initiated Contact with Patient 11/01/23 1748     (approximate)   History   Shortness of Breath   HPI  Seth [name] is a 62 y.o. male with past medical history of COPD here with shortness of breath.  The patient states that for the last week, he separatively worsening shortness of breath and wheezing.  Slight sputum production.  Slight subjective fevers.  He states has been progressively worsening and now feels very weak and has had poor appetite.  Has been taking Mucinex without major relief.   Physical Exam   Triage Vital Signs: ED Triage Vitals [11/01/23 1322]  Encounter Vitals Group     BP (!) 151/88     Systolic BP Percentile      Diastolic BP Percentile      Pulse Rate (!) 116     Resp (!) 22     Temp 98.1 F (36.7 C)     Temp Source Oral     SpO2 93 %     Weight 210 lb (95.3 kg)     Height 6\' 3"  (1.905 m)     Head Circumference      Peak Flow      Pain Score 0     Pain Loc      Pain Education      Exclude from Growth Chart     Most recent vital signs: Vitals:   11/01/23 1930 11/01/23 2000  BP: (!) 156/90 (!) 146/75  Pulse: (!) 126 (!) 122  Resp:  18  Temp:    SpO2: 90% 91%     General: Awake, no distress.  CV:  Good peripheral perfusion.  Tachycardic.  Regular. Resp:  Increased work of breathing with diffuse wheezing and diminished aeration.  Bilateral rales. Abd:  No distention.  No tenderness. Other:  Nontoxic, alert and oriented.   ED Results / Procedures / Treatments   Labs (all labs ordered are listed, but only abnormal results are displayed) Labs Reviewed  BASIC METABOLIC PANEL - Abnormal; Notable for the following components:      Result Value   Sodium 133 (*)    CO2 21 (*)    Glucose, Bld 106 (*)    Calcium 8.8 (*)    All other components within normal limits  CBC - Abnormal; Notable for the following components:   WBC 12.3 (*)    All other  components within normal limits  HEPATIC FUNCTION PANEL - Abnormal; Notable for the following components:   Bilirubin, Direct 0.3 (*)    All other components within normal limits  RESP PANEL BY RT-PCR (RSV, FLU A&B, COVID)  RVPGX2  CULTURE, BLOOD (ROUTINE X 2)  CULTURE, BLOOD (ROUTINE X 2)  LACTIC ACID, PLASMA  PROCALCITONIN  LACTIC ACID, PLASMA  PROTIME-INR  CORTISOL-AM, BLOOD  BASIC METABOLIC PANEL  CBC  HIV ANTIBODY (ROUTINE TESTING W REFLEX)  TROPONIN I (HIGH SENSITIVITY)  TROPONIN I (HIGH SENSITIVITY)     EKG Sinus tachycardia, ventricular at 120.  PR 158, QRS 107, QTc 462.  No acute ST elevations or depression acute no EKG evidence of acute ischemia or infarct.   RADIOLOGY Chest x-ray: Multifocal bibasilar pneumonia   I also independently reviewed and agree with radiologist interpretations.   PROCEDURES:  Critical Care performed: Yes, see critical care procedure note(s)  .Critical Care  Performed by: Shaune Pollack, MD Authorized by: Shaune Pollack, MD  Critical care provider statement:    Critical care time (minutes):  30   Critical care was necessary to treat or prevent imminent or life-threatening deterioration of the following conditions:  Cardiac failure, circulatory failure, respiratory failure and sepsis   Critical care was time spent personally by me on the following activities:  Development of treatment plan with patient or surrogate, discussions with consultants, evaluation of patient's response to treatment, examination of patient, ordering and review of laboratory studies, ordering and review of radiographic studies, ordering and performing treatments and interventions, pulse oximetry, re-evaluation of patient's condition and review of old charts     MEDICATIONS ORDERED IN ED: Medications  traMADol (ULTRAM) tablet 50 mg (0 mg Oral Hold 11/01/23 2223)  venlafaxine XR (EFFEXOR-XR) 24 hr capsule 150 mg (has no administration in time range)   baclofen (LIORESAL) tablet 10 mg (10 mg Oral Given 11/01/23 2212)  ipratropium-albuterol (DUONEB) 0.5-2.5 (3) MG/3ML nebulizer solution 3 mL (3 mLs Nebulization Given 11/01/23 2217)  guaiFENesin (MUCINEX) 12 hr tablet 600 mg (600 mg Oral Given 11/01/23 2212)  chlorpheniramine-HYDROcodone (TUSSIONEX) 10-8 MG/5ML suspension 5 mL (has no administration in time range)  lactated ringers infusion (150 mL/hr Intravenous New Bag/Given 11/01/23 2212)  acetaminophen (TYLENOL) tablet 650 mg (has no administration in time range)    Or  acetaminophen (TYLENOL) suppository 650 mg (has no administration in time range)  traZODone (DESYREL) tablet 25 mg (has no administration in time range)  magnesium hydroxide (MILK OF MAGNESIA) suspension 30 mL (has no administration in time range)  ondansetron (ZOFRAN) tablet 4 mg (has no administration in time range)    Or  ondansetron (ZOFRAN) injection 4 mg (has no administration in time range)  enoxaparin (LOVENOX) injection 40 mg (40 mg Subcutaneous Given 11/01/23 2217)  methylPREDNISolone sodium succinate (SOLU-MEDROL) 40 mg/mL injection 40 mg (has no administration in time range)  cefTRIAXone (ROCEPHIN) 2 g in sodium chloride 0.9 % 100 mL IVPB (has no administration in time range)  azithromycin (ZITHROMAX) 500 mg in sodium chloride 0.9 % 250 mL IVPB (has no administration in time range)  cefTRIAXone (ROCEPHIN) 2 g in sodium chloride 0.9 % 100 mL IVPB (0 g Intravenous Stopped 11/01/23 1820)  azithromycin (ZITHROMAX) 500 mg in sodium chloride 0.9 % 250 mL IVPB (0 mg Intravenous Stopped 11/01/23 1924)  sodium chloride 0.9 % bolus 1,000 mL (0 mLs Intravenous Stopped 11/01/23 1904)  ipratropium-albuterol (DUONEB) 0.5-2.5 (3) MG/3ML nebulizer solution 3 mL (3 mLs Nebulization Given 11/01/23 1814)  ipratropium-albuterol (DUONEB) 0.5-2.5 (3) MG/3ML nebulizer solution 3 mL (3 mLs Nebulization Given 11/01/23 1814)  ipratropium-albuterol (DUONEB) 0.5-2.5 (3) MG/3ML nebulizer  solution 3 mL (3 mLs Nebulization Given 11/01/23 1814)  methylPREDNISolone sodium succinate (SOLU-MEDROL) 125 mg/2 mL injection 125 mg (125 mg Intravenous Given 11/01/23 1815)  traMADol (ULTRAM) tablet 50 mg (50 mg Oral Given 11/01/23 1935)     IMPRESSION / MDM / ASSESSMENT AND PLAN / ED COURSE  I reviewed the triage vital signs and the nursing notes.                              Differential diagnosis includes, but is not limited to, COPD exacerbation, PNA, CHF, ACS, PE, anemia  Patient's presentation is most consistent with acute presentation with potential threat to life or bodily function.  The patient is on the cardiac monitor to evaluate for evidence of arrhythmia and/or significant heart rate changes  62 yo F with PMHx  COPD,  GERD, here with cough and shortness of breath.  On arrival, patient tach cardiac with diffuse wheezing.  Lab work shows moderate leukocytosis, troponin negative.  EKG nonischemic.  BMP with mild dehydration.  LFTs normal.  Lactic acid normal which is reassuring.  Chest x-ray shows multifocal bibasilar pneumonia, however.  Patient is tachycardic, with white count and findings concerning for pneumonia sepsis protocol initiated with broad-spectrum antibiotics.  Was given steroids and breathing treatments as well.  Will admit to medicine.   FINAL CLINICAL IMPRESSION(S) / ED DIAGNOSES   Final diagnoses:  Community acquired pneumonia, unspecified laterality  COPD exacerbation (HCC)  Sepsis without acute organ dysfunction, due to unspecified organism [facility])     Rx / DC Orders   ED Discharge Orders     None        Note:  This document was prepared using Dragon voice recognition software and may include unintentional dictation errors.   Shaune Pollack, MD 11/01/23 [phone]

## 2023-11-01 NOTE — Progress Notes (Signed)
CODE SEPSIS - PHARMACY COMMUNICATION  **Broad Spectrum Antibiotics should be administered within 1 hour of Sepsis diagnosis**  Time Code Sepsis Called/Page Received: 17:49  Antibiotics Ordered: Ceftriaxone and Azithromycin  Time of 1st antibiotic administration: Ceftriaxone given at 18:15  Additional action taken by pharmacy: n/a  If necessary, Name of Provider/Nurse Contacted: n/a    Foye Deer ,PharmD Clinical Pharmacist  11/01/2023  6:04 PM

## 2023-11-01 NOTE — Assessment & Plan Note (Signed)
-   This is likely hypovolemic. - We will continue hydration with IV lactated ringer and follow BMP.

## 2023-11-01 NOTE — Assessment & Plan Note (Signed)
-   This is clearly secondary to #1 and #2. - Management as above. - O2 protocol will be followed.

## 2023-11-01 NOTE — Sepsis Progress Note (Signed)
Sepsis protocol is being followed by [facility]. 

## 2023-11-01 NOTE — ED Triage Notes (Signed)
Presents to ED with /co of SOB and cough, flu like s/s for 1 week. Pt states HX of COPD. Pt endorses productive cough. Pt appears SOB at this time.  Pt states wife is sick as well. Pt states he has been taking Mucinex.

## 2023-11-01 NOTE — Assessment & Plan Note (Signed)
-   The patient be placed on IV Solu-Medrol as well as bronchodilator therapy and mucolytic therapy as above.

## 2023-11-01 NOTE — Telephone Encounter (Signed)
  Chief Complaint: URI Symptoms: cough with congestion, SOB at times, runny nose, chills, sore throat, body aches  Frequency: Tuesday last week  Pertinent Negatives: Patient denies fever Disposition: [] ED /[x] Urgent Care (no appt availability in office) / [] Appointment(In office/virtual)/ []  [place] [facility]/ [] Home Care/ [] Refused Recommended Disposition /[] [place] [facility]/ []  Follow-up with PCP Additional Notes: Pt states he has been taking Mucinex but not helping with sx. Pt wanting to be seen. No appts until tomorrow 4pm with PCP, offered but pt declined. Gave options for today seeking care and pt preferred to do walk in at [facility]. Will f/u if he changes his mind.   Summary: Sick, seeking appt today   Pt has possible upper respiratory infection symptoms also possible sinus infection symptoms, seeking appt today. Nothing available         Reason for Disposition  [1] Sinus congestion (pressure, fullness) AND [2] present > 10 days  Answer Assessment - Initial Assessment Questions 1. ONSET: "When did the nasal discharge start?"      Tuesday  3. COUGH: "Do you have a cough?" If Yes, ask: "Describe the color of your sputum" (clear, white, yellow, green)     Yes, creamish color  4. RESPIRATORY DISTRESS: "Describe your breathing."      SOB at times  5. FEVER: "Do you have a fever?" If Yes, ask: "What is your temperature, how was it measured, and when did it start?"     no 6. SEVERITY: "Overall, how bad are you feeling right now?" (e.g., doesn't interfere with normal activities, staying home from school/work, staying in bed)      Has been getting worse  7. OTHER SYMPTOMS: "Do you have any other symptoms?" (e.g., sore throat, earache, wheezing, vomiting)     Runny nose, chills, sore throat, body aches  Protocols used: Common Cold-A-AH

## 2023-11-01 NOTE — H&P (Signed)
[place]   PATIENT NAME: Seth [name]    MR#:  [id number]  DATE OF BIRTH:  May 28, 1961  DATE OF ADMISSION:  11/01/2023  PRIMARY CARE PHYSICIAN: Smitty Cords, DO   Patient is coming from:   Home  REQUESTING/REFERRING PHYSICIAN: Zenovia Jordan, MD  CHIEF COMPLAINT:   Chief Complaint  Patient presents with   Shortness of Breath    HISTORY OF PRESENT ILLNESS:  Seth [name] is a 62 y.o. Caucasian  male with medical history significant for GERD, DJD of the C and L-spine, and pression, presented to the emergency room with acute onset of worsening dyspnea with associated productive cough of yellowish-green sputum over the last 4 days as well as wheezing.  She started feeling sick on Thursday on Friday he left work.  He denied any fever or chills.  No nausea or vomiting or abdominal pain.  He continues to smoke 1 pack of cigarettes per day and has been smoking for 20 years.  No dysuria, oliguria or hematuria or flank pain.  No chest pain or palpitations.  ED Course: When he came did ER, BP was 151/88 with heart rate of 116 and respiratory to 22 and pulse symmetry 93% on room air and later 80-89% that came up on 3 L O2 by nasal cannula.  Labs revealed hyponatremia 133 and a CO2 of 21 with calcium of 8.8.  LFTs were within normal.  High sensitive troponin I was 3 twice.  Lactic acid was 1.9 and procalcitonin 0.2.  CBC showed leukocytosis of 12.3.  Respiratory panel came back negative.  Blood cultures were drawn. EKG as reviewed by me : EKG showed sinus tachycardia with rate of 120. Imaging: Two-view chest x-ray showed multifocal bibasal pneumonia and emphysema.  The patient was given IV Rocephin and Zithromax, Solu-Medrol, 125 mg of IV Solu-Medrol, DuoNebs X3, 1 L bolus of IV normal saline and 50 mg of p.o. tramadol.  He will be admitted to a progressive unit that for further evaluation and management. PAST MEDICAL HISTORY:   Past Medical History:  Diagnosis Date    Degenerative disc disease, cervical    Degenerative disc disease, lumbar    GERD (gastroesophageal reflux disease)    Leg pain    Neck pain    Wears partial dentures     PAST SURGICAL HISTORY:   Past Surgical History:  Procedure Laterality Date   BLADDER SURGERY  2002   removed scar tissue   CHOLECYSTECTOMY     COLONOSCOPY     LUMBAR LAMINECTOMY/DECOMPRESSION MICRODISCECTOMY N/A 04/07/2018   Procedure: RIGHT - SIDED LUMBAR FOUR- FIVE MICRODISECTOMY.;  Surgeon: Estill Bamberg, MD;  Location: [facility] OR;  Service: Orthopedics;  Laterality: N/A;  RIGHT - SIDED LUMBAR FOUR- FIVE MICRODISECTOMY.     MULTIPLE TOOTH EXTRACTIONS     UPPER EXTREMITY ANGIOGRAPHY Left 01/02/2019   Procedure: UPPER EXTREMITY ANGIOGRAPHY;  Surgeon: Annice Needy, MD;  Location: [facility] INVASIVE CV LAB;  Service: Cardiovascular;  Laterality: Left;    SOCIAL HISTORY:   Social History   Tobacco Use   Smoking status: Every Day    Current packs/day: 1.00    Average packs/day: 1 pack/day for 30.0 years (30.0 ttl pk-yrs)    Types: Cigarettes   Smokeless tobacco: Never  Substance Use Topics   Alcohol use: Yes    Comment: occasionally    FAMILY HISTORY:   Family History  Problem Relation Age of Onset   Diabetes Mother    Cancer Mother  bone   Cancer Father    Heart disease Father     DRUG ALLERGIES:  No Known Allergies  REVIEW OF SYSTEMS:   ROS As per history of present illness. All pertinent systems were reviewed above. Constitutional, HEENT, cardiovascular, respiratory, GI, GU, musculoskeletal, neuro, psychiatric, endocrine, integumentary and hematologic systems were reviewed and are otherwise negative/unremarkable except for positive findings mentioned above in the HPI.   MEDICATIONS AT HOME:   Prior to Admission medications   Medication Sig Start Date End Date Taking? Authorizing Provider  albuterol (PROVENTIL HFA;VENTOLIN HFA) 108 (90 Base) MCG/ACT inhaler Inhale 2 puffs into the lungs every 4  (four) hours as needed for wheezing or shortness of breath (cough). 11/23/18   Karamalegos, Netta Neat, DO  baclofen (LIORESAL) 10 MG tablet  01/10/19   [provider]  fluticasone (FLONASE) 50 MCG/ACT nasal spray Place 2 sprays into both nostrils daily. Use for 4-6 weeks then stop and use seasonally or as needed. 08/31/18   Althea Charon, Netta Neat, DO  ibuprofen (ADVIL,MOTRIN) 800 MG tablet  08/26/18   [provider]  traMADol (ULTRAM) 50 MG tablet Take 50 mg by mouth 4 (four) times daily. 05/03/21   [provider]  venlafaxine XR (EFFEXOR-XR) 150 MG 24 hr capsule Take 1 capsule (150 mg total) by mouth daily with breakfast. 11/25/22   Althea Charon, Netta Neat, DO      VITAL SIGNS:  Blood pressure (!) 146/75, pulse (!) 122, temperature 98.5 F (36.9 C), temperature source Oral, resp. rate 18, height 6\' 3"  (1.905 m), weight 95.3 kg, SpO2 91%.  PHYSICAL EXAMINATION:  Physical Exam  GENERAL:  62 y.o.-year-old patient semi-lying in the bed with mild respiratory distress with conversational dyspnea. EYES: Pupils equal, round, reactive to light and accommodation. No scleral icterus. Extraocular muscles intact.  HEENT: Head atraumatic, normocephalic. Oropharynx and nasopharynx clear.  NECK:  Supple, no jugular venous distention. No thyroid enlargement, no tenderness.  LUNGS: Manage bibasal breath sounds with bibasal crackles.  Diffuse expiratory wheezes with occasional rhonchi and tight expiratory airflow with harsh vesicular breathing.. No use of accessory muscles of respiration.  CARDIOVASCULAR: Regular rate and rhythm, S1, S2 normal. No murmurs, rubs, or gallops.  ABDOMEN: Soft, nondistended, nontender. Bowel sounds present. No organomegaly or mass.  EXTREMITIES: No pedal edema, cyanosis, or clubbing.  NEUROLOGIC: Cranial nerves II through XII are intact. Muscle strength 5/5 in all extremities. Sensation intact. Gait not checked.  PSYCHIATRIC: The patient is alert and  oriented x 3.  Normal affect and good eye contact. SKIN: No obvious rash, lesion, or ulcer.   LABORATORY PANEL:   CBC Recent Labs  Lab 11/01/23 1327  WBC 12.3*  HGB 16.3  HCT 46.6  PLT 216   ------------------------------------------------------------------------------------------------------------------  Chemistries  Recent Labs  Lab 11/01/23 1327 11/01/23 1811  NA 133*  --   K 3.9  --   CL 98  --   CO2 21*  --   GLUCOSE 106*  --   BUN 18  --   CREATININE 1.06  --   CALCIUM 8.8*  --   AST  --  20  ALT  --  20  ALKPHOS  --  69  BILITOT  --  1.2   ------------------------------------------------------------------------------------------------------------------  Cardiac Enzymes No results for input(s): "TROPONINI" in the last 168 hours. ------------------------------------------------------------------------------------------------------------------  RADIOLOGY:  DG Chest 2 View Result Date: 11/01/2023 CLINICAL DATA:  Short of breath and cough, flu like symptoms EXAM: CHEST - 2 VIEW COMPARISON:  04/05/2018 FINDINGS: Frontal  and lateral views of the chest demonstrate an unremarkable cardiac silhouette. Continued background emphysema. There is multifocal bibasilar airspace disease, left greater than right. No effusion or pneumothorax. No acute bony abnormalities. IMPRESSION: 1. Multifocal bibasilar pneumonia. 2. Emphysema. Electronically Signed   By: Sharlet Salina M.D.   On: 11/01/2023 15:22      IMPRESSION AND PLAN:  Assessment and Plan: * Sepsis due to pneumonia [facility]) - The patient will be admitted to a progressive unit bed. - Will continue antibiotic therapy with IV Rocephin and Zithromax. - Mucolytic therapy be provided as well as duo nebs q.i.d. and q.4 hours p.r.n. - We will follow blood cultures.   COPD with acute exacerbation (HCC) - The patient be placed on IV Solu-Medrol as well as bronchodilator therapy and mucolytic therapy as above.  Acute  respiratory failure with hypoxia (HCC) - This is clearly secondary to #1 and #2. - Management as above. - O2 protocol will be followed.  Hyponatremia - This is likely hypovolemic. - We will continue hydration with IV lactated ringer and follow BMP.  Depression - We will continue Effexor XR.   DVT prophylaxis: Lovenox.  Advanced Care Planning:  Code Status: full code.  Family Communication:  The plan of care was discussed in details with the patient (and family). I answered all questions. The patient agreed to proceed with the above mentioned plan. Further management will depend upon hospital course. Disposition Plan: Back to previous home environment Consults called: none.  All the records are reviewed and case discussed with ED provider.  Status is: Inpatient  At the time of the admission, it appears that the appropriate admission status for this patient is inpatient.  This is judged to be reasonable and necessary in order to provide the required intensity of service to ensure the patient's safety given the presenting symptoms, physical exam findings and initial radiographic and laboratory data in the context of comorbid conditions.  The patient requires inpatient status due to high intensity of service, high risk of further deterioration and high frequency of surveillance required.  I certify that at the time of admission, it is my clinical judgment that the patient will require inpatient hospital care extending more than 2 midnights.                            Dispo: The patient is from: Home              Anticipated d/c is to: Home              Patient currently is not medically stable to d/c.              Difficult to place patient: No  Hannah Beat M.D on 11/01/2023 at 11:16 PM  [facility]   From 7 PM-7 AM, contact night-coverage [email or web address]  CC: Primary care physician; Smitty Cords, DO

## 2023-11-01 NOTE — ED Provider Triage Note (Signed)
Emergency Medicine Provider Triage Evaluation Note  PSALM ROSCO , a 62 y.o. male  was evaluated in triage.  Pt complains of difficulty breathing, history of COPD.  Review of Systems  Positive:  Negative:   Physical Exam  BP (!) 151/88 (BP Location: Right Arm)   Pulse (!) 116   Temp 98.1 F (36.7 C) (Oral)   Resp (!) 22   Ht 6\' 3"  (1.905 m)   Wt 95.3 kg   SpO2 93%   BMI 26.25 kg/m  Gen:   Awake, no distress   Resp:  Normal effort  MSK:   Moves extremities without difficulty  Other:    Medical Decision Making  Medically screening exam initiated at 1:27 PM.  Appropriate orders placed.  SHIRLY MORESI was informed that the remainder of the evaluation will be completed by another provider, this initial triage assessment does not replace that evaluation, and the importance of remaining in the ED until their evaluation is complete.     Faythe Ghee, PA-C 11/01/23 1327

## 2023-11-01 NOTE — Assessment & Plan Note (Signed)
-   We will continue Effexor XR. 

## 2023-11-02 DIAGNOSIS — A419 Sepsis, unspecified organism: Secondary | ICD-10-CM | POA: Diagnosis not present

## 2023-11-02 DIAGNOSIS — J441 Chronic obstructive pulmonary disease with (acute) exacerbation: Secondary | ICD-10-CM | POA: Diagnosis not present

## 2023-11-02 DIAGNOSIS — F32A Depression, unspecified: Secondary | ICD-10-CM | POA: Diagnosis not present

## 2023-11-02 DIAGNOSIS — J189 Pneumonia, unspecified organism: Secondary | ICD-10-CM | POA: Diagnosis not present

## 2023-11-02 LAB — CBC
HCT: 38.5 % — ABNORMAL LOW (ref 39.0–52.0)
Hemoglobin: 13.7 g/dL (ref 13.0–17.0)
MCH: 31 pg (ref 26.0–34.0)
MCHC: 35.6 g/dL (ref 30.0–36.0)
MCV: 87.1 fL (ref 80.0–100.0)
Platelets: 189 10*3/uL (ref 150–400)
RBC: 4.42 MIL/uL (ref 4.22–5.81)
RDW: 12.2 % (ref 11.5–15.5)
WBC: 8.5 10*3/uL (ref 4.0–10.5)
nRBC: 0 % (ref 0.0–0.2)

## 2023-11-02 LAB — BASIC METABOLIC PANEL
Anion gap: 8 (ref 5–15)
BUN: 17 mg/dL (ref 6–20)
CO2: 22 mmol/L (ref 22–32)
Calcium: 8.3 mg/dL — ABNORMAL LOW (ref 8.9–10.3)
Chloride: 103 mmol/L (ref 98–111)
Creatinine, Ser: 0.94 mg/dL (ref 0.61–1.24)
GFR, Estimated: >60 mL/min (ref >60)
Glucose, Bld: 182 mg/dL — ABNORMAL HIGH (ref 70–99)
Potassium: 3.7 mmol/L (ref 3.5–5.1)
Sodium: 133 mmol/L — ABNORMAL LOW (ref 135–145)

## 2023-11-02 LAB — PROTIME-INR
INR: 1.1 (ref 0.8–1.2)
Prothrombin Time: 14.5 s (ref 11.4–15.2)

## 2023-11-02 LAB — HIV ANTIBODY (ROUTINE TESTING W REFLEX): HIV Screen 4th Generation wRfx: NONREACTIVE

## 2023-11-02 LAB — CORTISOL-AM, BLOOD: Cortisol - AM: 5.5 ug/dL — ABNORMAL LOW (ref 6.7–22.6)

## 2023-11-02 MED ORDER — VENLAFAXINE HCL ER 150 MG PO CP24
150.0000 mg | ORAL_CAPSULE | Freq: Every day | ORAL | Status: DC
  Administered 2023-11-02: 150 mg via ORAL
  Filled 2023-11-02: qty 1

## 2023-11-02 MED ORDER — ALBUTEROL SULFATE HFA 108 (90 BASE) MCG/ACT IN AERS: 2.0000 | INHALATION_SPRAY | RESPIRATORY_TRACT | 2 refills | Status: AC | PRN

## 2023-11-02 MED ORDER — HYDROCOD POLI-CHLORPHE POLI ER 10-8 MG/5ML PO SUER: 5.0000 mL | Freq: Two times a day (BID) | ORAL | 0 refills | Status: DC | PRN

## 2023-11-02 MED ORDER — GUAIFENESIN ER 600 MG PO TB12: 600.0000 mg | ORAL_TABLET | Freq: Two times a day (BID) | ORAL | 0 refills | Status: DC | PRN

## 2023-11-02 MED ORDER — IPRATROPIUM-ALBUTEROL 0.5-2.5 (3) MG/3ML IN SOLN: 3.0000 mL | Freq: Four times a day (QID) | RESPIRATORY_TRACT | 1 refills | Status: AC

## 2023-11-02 MED ORDER — ONDANSETRON HCL 4 MG PO TABS: 4.0000 mg | ORAL_TABLET | Freq: Four times a day (QID) | ORAL | 0 refills | Status: DC | PRN

## 2023-11-02 MED ORDER — LEVOFLOXACIN 750 MG PO TABS
750.0000 mg | ORAL_TABLET | Freq: Every day | ORAL | 0 refills | Status: AC
Start: 2023-11-02 — End: 2023-11-09

## 2023-11-02 MED ORDER — PREDNISONE 50 MG PO TABS: 50.0000 mg | ORAL_TABLET | Freq: Every day | ORAL | 0 refills | Status: AC

## 2023-11-02 NOTE — Hospital Course (Addendum)
 Taken from H&P.   Seth [name] is a 62 y.o. Caucasian  male with medical history significant for GERD, DJD of the C and L-spine, presented to the emergency room with acute onset of worsening dyspnea with associated productive cough of yellowish-green sputum over the last 4 days as well as wheezing.  On presentation mildly elevated blood pressure at 151/88, mild tachycardia and tachypnea, intermittently desaturating in 80s and was placed on 3 L.  Labs with sodium of 133, CO2 of 21, procalcitonin 0.2, lactic acid 1.9, WBC 12.3.  Respiratory panel negative for COVID, influenza and RSV.  Blood cultures were drawn.  CXR with multifocal bibasilar pneumonia and emphysema.  Patient was started on Rocephin  and Zithromax , also received Solu-Medrol  and DuoNeb along with normal saline.  12/31: Vital stable on room air, leukocytosis resolved, corrected sodium 134, Preliminary blood cultures negative.  A.m. cortisol levels came back low but blood pressure is mildly elevated.  No other signs of cortisone deficiency.  His PCP should be able to repeat and then make decision regarding referral to endocrinologist.  Patient was able to wean back to room air, we ambulated him with no significant desaturation.  No wheezing but do have some harsh breath sounds bilaterally. Cough seems improving, appetite improving.  He wants to go home.  We did discuss with him to follow-up with primary care provider and have his cortisol levels repeated.  Patient was given Levaquin  with 3 days of prednisone  and DuoNeb along with supportive care for cough and congestion.  Patient will continue the rest of his home medications and follow-up with his primary care provider for further management.

## 2023-11-02 NOTE — Discharge Summary (Signed)
 Physician Discharge Summary   Patient: Seth [name] MRN: [id number] DOB: 04/29/1961  Admit date:     11/01/2023  Discharge date: 11/02/23  Discharge Physician: Amaryllis Dare   PCP: Edman Marsa PARAS, DO   Recommendations at discharge:  Please obtain CBC and BMP in 1 week Please repeat a.m. cortisol level in 3 to 4 weeks and if remain low then he will need a referral to endocrinologist for further evaluation Follow-up with primary care provider within a week  Discharge Diagnoses: Principal Problem:   Sepsis due to pneumonia [facility]) Active Problems:   COPD exacerbation (HCC)   Acute respiratory failure with hypoxia (HCC)   Hyponatremia   Depression   Sepsis without acute organ dysfunction (HCC)  Resolved Problems:   * No resolved hospital problems. [facility] Course: Taken from H&P.   [name] is a 62 y.o. Caucasian  male with medical history significant for GERD, DJD of the C and L-spine, presented to the emergency room with acute onset of worsening dyspnea with associated productive cough of yellowish-green sputum over the last 4 days as well as wheezing.  On presentation mildly elevated blood pressure at 151/88, mild tachycardia and tachypnea, intermittently desaturating in 80s and was placed on 3 L.  Labs with sodium of 133, CO2 of 21, procalcitonin 0.2, lactic acid 1.9, WBC 12.3.  Respiratory panel negative for COVID, influenza and RSV.  Blood cultures were drawn.  CXR with multifocal bibasilar pneumonia and emphysema.  Patient was started on Rocephin  and Zithromax , also received Solu-Medrol  and DuoNeb along with normal saline.  12/31: Vital stable on room air, leukocytosis resolved, corrected sodium 134, Preliminary blood cultures negative.  A.m. cortisol levels came back low but blood pressure is mildly elevated.  No other signs of cortisone deficiency.  His PCP should be able to repeat and then make decision regarding referral to  endocrinologist.  Patient was able to wean back to room air, we ambulated him with no significant desaturation.  No wheezing but do have some harsh breath sounds bilaterally. Cough seems improving, appetite improving.  He wants to go home.  We did discuss with him to follow-up with primary care provider and have his cortisol levels repeated.  Patient was given Levaquin  with 3 days of prednisone  and DuoNeb along with supportive care for cough and congestion.  Patient will continue the rest of his home medications and follow-up with his primary care provider for further management.  Assessment and Plan: * Sepsis due to pneumonia (HCC) Blood cultures remain negative.  Labs improving. Patient received ceftriaxone  and Zithromax  while in the hospital and is being discharged on Levaquin .  COPD exacerbation (HCC) - The patient be placed on IV Solu-Medrol  as well as bronchodilator therapy and mucolytic therapy as above.  Acute respiratory failure with hypoxia (HCC) Resolved, now on room air.  Hyponatremia Slowly improving  Depression - We will continue Effexor  XR.  Consultants: None Procedures performed: None Disposition: Home Diet recommendation:  Discharge Diet Orders (From admission, onward)     Start     Ordered   11/02/23 0000  Diet - low sodium heart healthy        11/02/23 1435           Cardiac diet DISCHARGE MEDICATION: Allergies as of 11/02/2023   No Known Allergies      Medication List     TAKE these medications    albuterol  108 (90 Base) MCG/ACT inhaler Commonly known as: VENTOLIN  HFA Inhale 2 puffs  into the lungs every 4 (four) hours as needed for wheezing or shortness of breath (cough).   baclofen  10 MG tablet Commonly known as: LIORESAL  Take 10 mg by mouth 3 (three) times daily as needed.   chlorpheniramine-HYDROcodone 10-8 MG/5ML Commonly known as: TUSSIONEX Take 5 mLs by mouth every 12 (twelve) hours as needed for cough.   fluticasone  50  MCG/ACT nasal spray Commonly known as: FLONASE  Place 2 sprays into both nostrils daily. Use for 4-6 weeks then stop and use seasonally or as needed.   guaiFENesin  600 MG 12 hr tablet Commonly known as: MUCINEX  Take 1 tablet (600 mg total) by mouth 2 (two) times daily as needed for cough or to loosen phlegm.   ipratropium-albuterol  0.5-2.5 (3) MG/3ML Soln Commonly known as: DUONEB Take 3 mLs by nebulization 4 (four) times daily. For next 2 to 3 days and then as needed   levofloxacin  750 MG tablet Commonly known as: Levaquin  Take 1 tablet (750 mg total) by mouth daily for 7 days.   ondansetron  4 MG tablet Commonly known as: ZOFRAN  Take 1 tablet (4 mg total) by mouth every 6 (six) hours as needed for nausea.   predniSONE  50 MG tablet Commonly known as: DELTASONE  Take 1 tablet (50 mg total) by mouth daily with breakfast for 3 days.   traMADol  50 MG tablet Commonly known as: ULTRAM  Take 50 mg by mouth 4 (four) times daily.   venlafaxine  XR 150 MG 24 hr capsule Commonly known as: EFFEXOR -XR Take 1 capsule (150 mg total) by mouth daily with breakfast.               Durable Medical Equipment  (From admission, onward)           Start     Ordered   11/02/23 1414  For home use only DME Nebulizer machine  Once       Question Answer Comment  Patient needs a nebulizer to treat with the following condition COPD exacerbation (HCC)   Length of Need Lifetime   Additional equipment included Administration kit   Additional equipment included Filter      11/02/23 1414            Follow-up Information     Edman Marsa PARAS, DO. Schedule an appointment as soon as possible for a visit in 1 week(s).   Specialty: Family Medicine Contact information: [address] [name] [address] [phone]                Discharge Exam: Fredricka Weights   11/01/23 1322  Weight: 95.3 kg   General.  Well-developed gentleman, in no acute distress. Pulmonary.  Little  harsh breath sound bilaterally, normal respiratory effort. CV.  Regular rate and rhythm, no JVD, rub or murmur. Abdomen.  Soft, nontender, nondistended, BS positive. CNS.  Alert and oriented .  No focal neurologic deficit. Extremities.  No edema, no cyanosis, pulses intact and symmetrical. Psychiatry.  Judgment and insight appears normal.   Condition at discharge: stable  The results of significant diagnostics from this hospitalization (including imaging, microbiology, ancillary and laboratory) are listed below for reference.   Imaging Studies: DG Chest 2 View Result Date: 11/01/2023 CLINICAL DATA:  Short of breath and cough, flu like symptoms EXAM: CHEST - 2 VIEW COMPARISON:  04/05/2018 FINDINGS: Frontal and lateral views of the chest demonstrate an unremarkable cardiac silhouette. Continued background emphysema. There is multifocal bibasilar airspace disease, left greater than right. No effusion or pneumothorax. No acute bony abnormalities.  IMPRESSION: 1. Multifocal bibasilar pneumonia. 2. Emphysema. Electronically Signed   By: Ozell Daring M.D.   On: 11/01/2023 15:22    Microbiology: Results for orders placed or performed during the hospital encounter of 11/01/23  Resp panel by RT-PCR (RSV, Flu A&B, Covid) Anterior Nasal Swab     Status: None   Collection Time: 11/01/23  1:27 PM   Specimen: Anterior Nasal Swab  Result Value Ref Range Status   SARS Coronavirus 2 by RT PCR NEGATIVE NEGATIVE Final    Comment: (NOTE) SARS-CoV-2 target nucleic acids are NOT DETECTED.  The SARS-CoV-2 RNA is generally detectable in upper respiratory specimens during the acute phase of infection. The lowest concentration of SARS-CoV-2 viral copies this assay can detect is 138 copies/mL. A negative result does not preclude SARS-Cov-2 infection and should not be used as the sole basis for treatment or other patient management decisions. A negative result may occur with  improper specimen  collection/handling, submission of specimen other than nasopharyngeal swab, presence of viral mutation(s) within the areas targeted by this assay, and inadequate number of viral copies(<138 copies/mL). A negative result must be combined with clinical observations, patient history, and epidemiological information. The expected result is Negative.  Fact Sheet for Patients:  bloggercourse.com  Fact Sheet for Healthcare Providers:  seriousbroker.it  This test is no t yet approved or cleared by the [place]  FDA and  has been authorized for detection and/or diagnosis of SARS-CoV-2 by FDA under an Emergency Use Authorization (EUA). This EUA will remain  in effect (meaning this test can be used) for the duration of the COVID-19 declaration under Section 564(b)(1) of the Act, 21 U.S.C.section 360bbb-3(b)(1), unless the authorization is terminated  or revoked sooner.       Influenza A by PCR NEGATIVE NEGATIVE Final   Influenza B by PCR NEGATIVE NEGATIVE Final    Comment: (NOTE) The Xpert Xpress SARS-CoV-2/FLU/RSV plus assay is intended as an aid in the diagnosis of influenza from Nasopharyngeal swab specimens and should not be used as a sole basis for treatment. Nasal washings and aspirates are unacceptable for Xpert Xpress SARS-CoV-2/FLU/RSV testing.  Fact Sheet for Patients: bloggercourse.com  Fact Sheet for Healthcare Providers: seriousbroker.it  This test is not yet approved or cleared by the [place]  FDA and has been authorized for detection and/or diagnosis of SARS-CoV-2 by FDA under an Emergency Use Authorization (EUA). This EUA will remain in effect (meaning this test can be used) for the duration of the COVID-19 declaration under Section 564(b)(1) of the Act, 21 U.S.C. section 360bbb-3(b)(1), unless the authorization is terminated or revoked.     Resp Syncytial  Virus by PCR NEGATIVE NEGATIVE Final    Comment: (NOTE) Fact Sheet for Patients: bloggercourse.com  Fact Sheet for Healthcare Providers: seriousbroker.it  This test is not yet approved or cleared by the [place]  FDA and has been authorized for detection and/or diagnosis of SARS-CoV-2 by FDA under an Emergency Use Authorization (EUA). This EUA will remain in effect (meaning this test can be used) for the duration of the COVID-19 declaration under Section 564(b)(1) of the Act, 21 U.S.C. section 360bbb-3(b)(1), unless the authorization is terminated or revoked.  Performed at [facility], [address] [place], [name] [address]   Blood culture (routine x 2)     Status: None (Preliminary result)   Collection Time: 11/01/23  6:11 PM   Specimen: BLOOD  Result Value Ref Range Status   Specimen Description BLOOD RIGHT ANTECUBITAL  Final  Special Requests   Final    BOTTLES DRAWN AEROBIC AND ANAEROBIC Blood Culture adequate volume   Culture   Final    NO GROWTH < 24 HOURS Performed at [facility], [address] [place], [name] [address]    Report Status PENDING  Incomplete  Blood culture (routine x 2)     Status: None (Preliminary result)   Collection Time: 11/01/23  6:11 PM   Specimen: BLOOD  Result Value Ref Range Status   Specimen Description BLOOD BLOOD RIGHT FOREARM  Final   Special Requests   Final    BOTTLES DRAWN AEROBIC AND ANAEROBIC Blood Culture adequate volume   Culture   Final    NO GROWTH < 24 HOURS Performed at [facility], [address] [place], [name] [address]    Report Status PENDING  Incomplete    Labs: CBC: Recent Labs  Lab 11/01/23 1327 11/02/23 0510  WBC 12.3* 8.5  HGB 16.3 13.7  HCT 46.6 38.5*  MCV 90.5 87.1  PLT 216 189   Basic Metabolic Panel: Recent Labs  Lab 11/01/23 1327 11/02/23 0510  NA 133* 133*  K 3.9 3.7  CL 98 103  CO2 21* 22   GLUCOSE 106* 182*  BUN 18 17  CREATININE 1.06 0.94  CALCIUM 8.8* 8.3*   Liver Function Tests: Recent Labs  Lab 11/01/23 1811  AST 20  ALT 20  ALKPHOS 69  BILITOT 1.2  PROT 7.7  ALBUMIN 3.8   CBG: No results for input(s): GLUCAP in the last 168 hours.  Discharge time spent: greater than 30 minutes.  This record has been created using Conservation officer, historic buildings. Errors have been sought and corrected,but may not always be located. Such creation errors do not reflect on the standard of care.   Signed: Amaryllis Dare, MD [facility] 11/02/2023

## 2023-11-02 NOTE — Discharge Instructions (Signed)
 Patient: Seth [name] MRN: [id number] DOB: 13-Aug-1961  Admit date:     11/01/2023  Discharge date: 11/02/23  Discharge Physician: Amaryllis Dare    PCP: Edman Marsa PARAS, DO    Recommendations at discharge:  Please obtain CBC and BMP in 1 week Please repeat a.m. cortisol level in 3 to 4 weeks and if remain low then he will need a referral to endocrinologist for further evaluation Follow-up with primary care provider within a week

## 2023-11-02 NOTE — ED Notes (Signed)
Pt conducted 100 ft walk test in ER hallway. Pt maintained O2 sats 97%-100% denies SOB, dizziness or fatigue. Pt states he has COPD but is not on any home O2. HCP notified.

## 2023-11-05 ENCOUNTER — Ambulatory Visit: Payer: BC Managed Care – PPO | Admitting: Family Medicine

## 2023-11-05 ENCOUNTER — Encounter: Payer: Self-pay | Admitting: Family Medicine

## 2023-11-05 VITALS — BP 138/86 | HR 85 | Ht 75.0 in | Wt 210.0 lb

## 2023-11-05 DIAGNOSIS — J189 Pneumonia, unspecified organism: Secondary | ICD-10-CM | POA: Diagnosis not present

## 2023-11-05 DIAGNOSIS — J432 Centrilobular emphysema: Secondary | ICD-10-CM

## 2023-11-05 DIAGNOSIS — J188 Other pneumonia, unspecified organism: Secondary | ICD-10-CM

## 2023-11-05 NOTE — Progress Notes (Signed)
 Subjective:    Patient ID: Seth [name], male    DOB: 17-Dec-1960, 63 y.o.   MRN: [id number]  Seth [name] is a 63 y.o. male presenting on 11/05/2023 for Hospitalization Follow-up   HPI  Discussed the use of AI scribe software for clinical note transcription with the patient, who gave verbal consent to proceed.  History of Present Illness         HOSPITAL FOLLOW-UP VISIT  Hospital/Location: [facility] Date of Admission: 11/01/23 Date of Discharge: 11/02/23 Transitions of care telephone call: Not completed  Reason for Admission: Pneumonia  - Hospital H&P and Discharge Summary have been reviewed - Patient presents today 3 days after recent hospitalization. Brief summary of recent course, patient had symptoms of 4 days of cough wheezing productive yellow sputum, dyspnea, went to [facility], had lower O2 sat, sent to [facility] ED for evaluation. He was diagnosed with multifocal pneumonia in setting of emphysema, after CXR, also labs, and cardiac work up EKG done as well. Started on steroids, breathing treatment and antibiotic.  - Today reports overall has done well after discharge. Symptoms of dyspnea have improved but not resolved. Still has sinus pressure congestion drainage, some coughing.   - New medications on discharge: Levaquin  750mg  daily x 7 days, Prednisone  taper, Mucinex , Tussionex cough syrup, Albuterol  rescue inhaler, he just received Nebulizer DUONEB.  - Changes to current meds on discharge: none.  I have reviewed the discharge medication list, and have reconciled the current and discharge medications today.   Current Outpatient Medications:    albuterol  (VENTOLIN  HFA) 108 (90 Base) MCG/ACT inhaler, Inhale 2 puffs into the lungs every 4 (four) hours as needed for wheezing or shortness of breath (cough)., Disp: 1 each, Rfl: 2   baclofen  (LIORESAL ) 10 MG tablet, Take 10 mg by mouth 3 (three) times daily as needed., Disp: , Rfl:     chlorpheniramine-HYDROcodone (TUSSIONEX) 10-8 MG/5ML, Take 5 mLs by mouth every 12 (twelve) hours as needed for cough., Disp: 120 mL, Rfl: 0   guaiFENesin  (MUCINEX ) 600 MG 12 hr tablet, Take 1 tablet (600 mg total) by mouth 2 (two) times daily as needed for cough or to loosen phlegm., Disp: 60 tablet, Rfl: 0   ipratropium-albuterol  (DUONEB) 0.5-2.5 (3) MG/3ML SOLN, Take 3 mLs by nebulization 4 (four) times daily. For next 2 to 3 days and then as needed, Disp: 360 mL, Rfl: 1   levofloxacin  (LEVAQUIN ) 750 MG tablet, Take 1 tablet (750 mg total) by mouth daily for 7 days., Disp: 7 tablet, Rfl: 0   ondansetron  (ZOFRAN ) 4 MG tablet, Take 1 tablet (4 mg total) by mouth every 6 (six) hours as needed for nausea., Disp: 20 tablet, Rfl: 0   predniSONE  (DELTASONE ) 50 MG tablet, Take 1 tablet (50 mg total) by mouth daily with breakfast for 3 days., Disp: 3 tablet, Rfl: 0   traMADol  (ULTRAM ) 50 MG tablet, Take 50 mg by mouth 4 (four) times daily., Disp: , Rfl:    venlafaxine  XR (EFFEXOR -XR) 150 MG 24 hr capsule, Take 1 capsule (150 mg total) by mouth daily with breakfast., Disp: 90 capsule, Rfl: 3   fluticasone  (FLONASE ) 50 MCG/ACT nasal spray, Place 2 sprays into both nostrils daily. Use for 4-6 weeks then stop and use seasonally or as needed. (Patient not taking: Reported on 11/01/2023), Disp: 16 g, Rfl: 3  ------------------------------------------------------------------------- Social History   Tobacco Use   Smoking status: Every Day    Current packs/day: 1.00  Average packs/day: 1 pack/day for 30.0 years (30.0 ttl pk-yrs)    Types: Cigarettes   Smokeless tobacco: Never  Vaping Use   Vaping status: Former  Substance Use Topics   Alcohol use: Yes    Comment: occasionally   Drug use: No    Review of Systems Per HPI unless specifically indicated above     Objective:    BP 138/86 (BP Location: Left Arm, Cuff Size: Normal)   Pulse 85   Ht 6' 3 (1.905 m)   Wt 210 lb (95.3 kg)   SpO2 92%    BMI 26.25 kg/m   Wt Readings from Last 3 Encounters:  11/05/23 210 lb (95.3 kg)  11/01/23 210 lb (95.3 kg)  06/01/23 216 lb (98 kg)    Physical Exam Vitals and nursing note reviewed.  Constitutional:      General: He is not in acute distress.    Appearance: He is well-developed. He is not diaphoretic.     Comments: Well-appearing, comfortable, cooperative  HENT:     Head: Normocephalic and atraumatic.  Eyes:     General:        Right eye: No discharge.        Left eye: No discharge.     Conjunctiva/sclera: Conjunctivae normal.  Neck:     Thyroid: No thyromegaly.  Cardiovascular:     Rate and Rhythm: Normal rate and regular rhythm.     Pulses: Normal pulses.     Heart sounds: Normal heart sounds. No murmur heard. Pulmonary:     Effort: Pulmonary effort is normal. No respiratory distress.     Breath sounds: No wheezing or rales.     Comments: Mild reduced air movement, non focal today. No wheezing. Some mild coarse sound. Musculoskeletal:        General: Normal range of motion.     Cervical back: Normal range of motion and neck supple.  Lymphadenopathy:     Cervical: No cervical adenopathy.  Skin:    General: Skin is warm and dry.     Findings: No erythema or rash.  Neurological:     Mental Status: He is alert and oriented to person, place, and time. Mental status is at baseline.  Psychiatric:        Behavior: Behavior normal.     Comments: Well groomed, good eye contact, normal speech and thoughts     I have personally reviewed the radiology report from 11/01/23 on CXR.   CLINICAL DATA:  Short of breath and cough, flu like symptoms   EXAM: CHEST - 2 VIEW   COMPARISON:  04/05/2018   FINDINGS: Frontal and lateral views of the chest demonstrate an unremarkable cardiac silhouette. Continued background emphysema. There is multifocal bibasilar airspace disease, left greater than right. No effusion or pneumothorax. No acute bony abnormalities.   IMPRESSION: 1.  Multifocal bibasilar pneumonia. 2. Emphysema.     Electronically Signed   By: Ozell Daring M.D.   On: 11/01/2023 15:22  Results for orders placed or performed during the hospital encounter of 11/01/23  Resp panel by RT-PCR (RSV, Flu A&B, Covid) Anterior Nasal Swab   Collection Time: 11/01/23  1:27 PM   Specimen: Anterior Nasal Swab  Result Value Ref Range   SARS Coronavirus 2 by RT PCR NEGATIVE NEGATIVE   Influenza A by PCR NEGATIVE NEGATIVE   Influenza B by PCR NEGATIVE NEGATIVE   Resp Syncytial Virus by PCR NEGATIVE NEGATIVE  Basic metabolic panel   Collection Time: 11/01/23  1:27 PM  Result Value Ref Range   Sodium 133 (L) 135 - 145 mmol/L   Potassium 3.9 3.5 - 5.1 mmol/L   Chloride 98 98 - 111 mmol/L   CO2 21 (L) 22 - 32 mmol/L   Glucose, Bld 106 (H) 70 - 99 mg/dL   BUN 18 6 - 20 mg/dL   Creatinine, Ser 8.93 0.61 - 1.24 mg/dL   Calcium 8.8 (L) 8.9 - 10.3 mg/dL   GFR, Estimated >39 >39 mL/min   Anion gap 14 5 - 15  CBC   Collection Time: 11/01/23  1:27 PM  Result Value Ref Range   WBC 12.3 (H) 4.0 - 10.5 K/uL   RBC 5.15 4.22 - 5.81 MIL/uL   Hemoglobin 16.3 13.0 - 17.0 g/dL   HCT 53.3 60.9 - 47.9 %   MCV 90.5 80.0 - 100.0 fL   MCH 31.7 26.0 - 34.0 pg   MCHC 35.0 30.0 - 36.0 g/dL   RDW 87.8 88.4 - 84.4 %   Platelets 216 150 - 400 K/uL   nRBC 0.0 0.0 - 0.2 %  Troponin I (High Sensitivity)   Collection Time: 11/01/23  1:27 PM  Result Value Ref Range   Troponin I (High Sensitivity) 3 <18 ng/L  Blood culture (routine x 2)   Collection Time: 11/01/23  6:11 PM   Specimen: BLOOD  Result Value Ref Range   Specimen Description BLOOD RIGHT ANTECUBITAL    Special Requests      BOTTLES DRAWN AEROBIC AND ANAEROBIC Blood Culture adequate volume   Culture      NO GROWTH 4 DAYS Performed at [facility], [address] [place], [place] [address]    Report Status PENDING   Blood culture (routine x 2)   Collection Time: 11/01/23  6:11 PM   Specimen: BLOOD   Result Value Ref Range   Specimen Description BLOOD BLOOD RIGHT FOREARM    Special Requests      BOTTLES DRAWN AEROBIC AND ANAEROBIC Blood Culture adequate volume   Culture      NO GROWTH 4 DAYS Performed at [facility], [address] [place], [place] [address]    Report Status PENDING   Lactic acid, plasma   Collection Time: 11/01/23  6:11 PM  Result Value Ref Range   Lactic Acid, Venous 1.9 0.5 - 1.9 mmol/L  Hepatic function panel   Collection Time: 11/01/23  6:11 PM  Result Value Ref Range   Total Protein 7.7 6.5 - 8.1 g/dL   Albumin 3.8 3.5 - 5.0 g/dL   AST 20 15 - 41 U/L   ALT 20 0 - 44 U/L   Alkaline Phosphatase 69 38 - 126 U/L   Total Bilirubin 1.2 0.0 - 1.2 mg/dL   Bilirubin, Direct 0.3 (H) 0.0 - 0.2 mg/dL   Indirect Bilirubin 0.9 0.3 - 0.9 mg/dL  Procalcitonin   Collection Time: 11/01/23  6:11 PM  Result Value Ref Range   Procalcitonin 0.20 ng/mL  Troponin I (High Sensitivity)   Collection Time: 11/01/23  6:11 PM  Result Value Ref Range   Troponin I (High Sensitivity) 3 <18 ng/L  Protime-INR   Collection Time: 11/02/23  5:10 AM  Result Value Ref Range   Prothrombin Time 14.5 11.4 - 15.2 seconds   INR 1.1 0.8 - 1.2  Cortisol-am, blood   Collection Time: 11/02/23  5:10 AM  Result Value Ref Range   Cortisol - AM 5.5 (L) 6.7 - 22.6 ug/dL  Basic metabolic panel  Collection Time: 11/02/23  5:10 AM  Result Value Ref Range   Sodium 133 (L) 135 - 145 mmol/L   Potassium 3.7 3.5 - 5.1 mmol/L   Chloride 103 98 - 111 mmol/L   CO2 22 22 - 32 mmol/L   Glucose, Bld 182 (H) 70 - 99 mg/dL   BUN 17 6 - 20 mg/dL   Creatinine, Ser 9.05 0.61 - 1.24 mg/dL   Calcium 8.3 (L) 8.9 - 10.3 mg/dL   GFR, Estimated >39 >39 mL/min   Anion gap 8 5 - 15  CBC   Collection Time: 11/02/23  5:10 AM  Result Value Ref Range   WBC 8.5 4.0 - 10.5 K/uL   RBC 4.42 4.22 - 5.81 MIL/uL   Hemoglobin 13.7 13.0 - 17.0 g/dL   HCT 61.4 (L) 60.9 - 47.9 %   MCV 87.1 80.0 - 100.0 fL    MCH 31.0 26.0 - 34.0 pg   MCHC 35.6 30.0 - 36.0 g/dL   RDW 87.7 88.4 - 84.4 %   Platelets 189 150 - 400 K/uL   nRBC 0.0 0.0 - 0.2 %  HIV Antibody (routine testing w rflx)   Collection Time: 11/02/23  5:10 AM  Result Value Ref Range   HIV Screen 4th Generation wRfx Non Reactive Non Reactive      Assessment & Plan:   Problem List Items Addressed This Visit   None Visit Diagnoses       Multifocal pneumonia    -  Primary   Relevant Orders   DG Chest 2 View      Multifocal Pneumonia Recent hospitalization for pneumonia. Currently on Levaquin  and steroids. Improvement in symptoms but still experiencing some shortness of breath and cough. No signs of fluid or crackling on lung exam. -Continue current medications as prescribed. -Repeat chest x-ray on 12/01/2023 to ensure resolution of pneumonia. Order is in, for [facility]  Underlying COPD/Emphysema Chronic condition, likely contributing to current respiratory symptoms. Currently using rescue inhaler and nebulizer treatments. -Continue use of inhaler and nebulizer treatments as needed.  Sinusitis Ongoing sinus symptoms, likely contributing to cough. -Continue Mucinex  and nasal spray as prescribed.  General Health Maintenance -Physical exam scheduled for early February. -Consider smoking cessation for overall health improvement.      Additional concern in hospital - mild low cortisol level Based on review of hospital record, I am not able to correlate this clinically to explain symptoms or understand the etiology. [name] acute illness and steroids, will not consider repeat testing at this time. His BP has maintained.  Will monitor for need in future if indicated.   No orders of the defined types were placed in this encounter.   Follow up plan: Return if symptoms worsen or fail to improve.   Marsa Officer, DO [facility] 11/05/2023, 10:28 AM

## 2023-11-05 NOTE — Patient Instructions (Addendum)
 Thank you for coming to the office today.  X-ray here on 12/01/23 when you arrive for labs.  Keep on current treatment Levaquin  + cough syrup and steroid medication that they ordered.  Please schedule a Follow-up Appointment to: Return if symptoms worsen or fail to improve.  If you have any other questions or concerns, please feel free to call the office or send a message through MyChart. You may also schedule an earlier appointment if necessary.  Additionally, you may be receiving a survey about your experience at our office within a few days to 1 week by e-mail or mail. We value your feedback.  Marsa Officer, DO [facility], [place]

## 2023-11-06 LAB — CULTURE, BLOOD (ROUTINE X 2)
Culture: NO GROWTH
Culture: NO GROWTH
Special Requests: ADEQUATE
Special Requests: ADEQUATE

## 2023-12-01 ENCOUNTER — Other Ambulatory Visit: Payer: BC Managed Care – PPO

## 2023-12-01 ENCOUNTER — Ambulatory Visit
Admission: RE | Admit: 2023-12-01 | Discharge: 2023-12-01 | Disposition: A | Discharge status: Home / Self Care | Payer: BC Managed Care – PPO | Source: Ambulatory Visit | Attending: Family Medicine | Admitting: Family Medicine

## 2023-12-01 ENCOUNTER — Ambulatory Visit
Admission: RE | Admit: 2023-12-01 | Discharge: 2023-12-01 | Disposition: A | Discharge status: Home / Self Care | Payer: BC Managed Care – PPO | Attending: Family Medicine | Admitting: Family Medicine

## 2023-12-01 DIAGNOSIS — R7303 Prediabetes: Secondary | ICD-10-CM

## 2023-12-01 DIAGNOSIS — J189 Pneumonia, unspecified organism: Secondary | ICD-10-CM | POA: Insufficient documentation

## 2023-12-01 DIAGNOSIS — R351 Nocturia: Secondary | ICD-10-CM

## 2023-12-01 DIAGNOSIS — I700 Atherosclerosis of aorta: Secondary | ICD-10-CM

## 2023-12-01 DIAGNOSIS — E559 Vitamin D deficiency, unspecified: Secondary | ICD-10-CM

## 2023-12-01 DIAGNOSIS — Z0000 Encounter for general adult medical examination without abnormal findings: Secondary | ICD-10-CM

## 2023-12-01 DIAGNOSIS — E782 Mixed hyperlipidemia: Secondary | ICD-10-CM

## 2023-12-02 ENCOUNTER — Encounter: Payer: Self-pay | Admitting: Family Medicine

## 2023-12-02 LAB — COMPLETE METABOLIC PANEL WITH GFR
AG Ratio: 1.7 (calc) (ref 1.0–2.5)
ALT: 20 U/L (ref 9–46)
AST: 16 U/L (ref 10–35)
Albumin: 4.3 g/dL (ref 3.6–5.1)
Alkaline phosphatase (APISO): 57 U/L (ref 35–144)
BUN: 21 mg/dL (ref 7–25)
CO2: 28 mmol/L (ref 20–32)
Calcium: 9.4 mg/dL (ref 8.6–10.3)
Chloride: 106 mmol/L (ref 98–110)
Creat: 1.27 mg/dL (ref 0.70–1.35)
Globulin: 2.6 g/dL (ref 1.9–3.7)
Glucose, Bld: 110 mg/dL — ABNORMAL HIGH (ref 65–99)
Potassium: 4.6 mmol/L (ref 3.5–5.3)
Sodium: 140 mmol/L (ref 135–146)
Total Bilirubin: 0.5 mg/dL (ref 0.2–1.2)
Total Protein: 6.9 g/dL (ref 6.1–8.1)
eGFR: 65 mL/min/{1.73_m2} (ref >=60)

## 2023-12-02 LAB — CBC WITH DIFFERENTIAL/PLATELET
Absolute Lymphocytes: 2457 {cells}/uL (ref 850–3900)
Absolute Monocytes: 655 {cells}/uL (ref 200–950)
Basophils Absolute: 101 {cells}/uL (ref 0–200)
Basophils Relative: 1.3 %
Eosinophils Absolute: 281 {cells}/uL (ref 15–500)
Eosinophils Relative: 3.6 %
HCT: 42.1 % (ref 38.5–50.0)
Hemoglobin: 14.3 g/dL (ref 13.2–17.1)
MCH: 30.8 pg (ref 27.0–33.0)
MCHC: 34 g/dL (ref 32.0–36.0)
MCV: 90.5 fL (ref 80.0–100.0)
MPV: 10.2 fL (ref 7.5–12.5)
Monocytes Relative: 8.4 %
Neutro Abs: 4306 {cells}/uL (ref 1500–7800)
Neutrophils Relative %: 55.2 %
Platelets: 322 10*3/uL (ref 140–400)
RBC: 4.65 10*6/uL (ref 4.20–5.80)
RDW: 12.3 % (ref 11.0–15.0)
Total Lymphocyte: 31.5 %
WBC: 7.8 10*3/uL (ref 3.8–10.8)

## 2023-12-02 LAB — LIPID PANEL
Cholesterol: 188 mg/dL (ref <200)
HDL: 36 mg/dL — ABNORMAL LOW (ref >=40)
LDL Cholesterol (Calc): 129 mg/dL — ABNORMAL HIGH
Non-HDL Cholesterol (Calc): 152 mg/dL — ABNORMAL HIGH (ref <130)
Total CHOL/HDL Ratio: 5.2 (calc) — ABNORMAL HIGH (ref <5.0)
Triglycerides: 120 mg/dL (ref <150)

## 2023-12-02 LAB — PSA: PSA: 0.94 ng/mL (ref <=4.00)

## 2023-12-02 LAB — HEMOGLOBIN A1C
Hgb A1c MFr Bld: 6.8 %{Hb} — ABNORMAL HIGH (ref <5.7)
Mean Plasma Glucose: 148 mg/dL
eAG (mmol/L): 8.2 mmol/L

## 2023-12-02 LAB — TSH: TSH: 2.36 m[IU]/L (ref 0.40–4.50)

## 2023-12-02 LAB — VITAMIN D 25 HYDROXY (VIT D DEFICIENCY, FRACTURES): Vit D, 25-Hydroxy: 26 ng/mL — ABNORMAL LOW (ref 30–100)

## 2023-12-08 ENCOUNTER — Ambulatory Visit: Payer: BC Managed Care – PPO | Admitting: Family Medicine

## 2023-12-08 ENCOUNTER — Encounter: Payer: Self-pay | Admitting: Family Medicine

## 2023-12-08 VITALS — BP 134/78 | HR 93 | Ht 75.0 in | Wt 215.0 lb

## 2023-12-08 DIAGNOSIS — E782 Mixed hyperlipidemia: Secondary | ICD-10-CM

## 2023-12-08 DIAGNOSIS — J432 Centrilobular emphysema: Secondary | ICD-10-CM

## 2023-12-08 DIAGNOSIS — R7303 Prediabetes: Secondary | ICD-10-CM

## 2023-12-08 DIAGNOSIS — Z0000 Encounter for general adult medical examination without abnormal findings: Secondary | ICD-10-CM

## 2023-12-08 DIAGNOSIS — I700 Atherosclerosis of aorta: Secondary | ICD-10-CM | POA: Diagnosis not present

## 2023-12-08 DIAGNOSIS — E559 Vitamin D deficiency, unspecified: Secondary | ICD-10-CM

## 2023-12-08 NOTE — Patient Instructions (Addendum)
 Thank you for coming to the office today.  Recent Labs    06/01/23 1122 12/01/23 0803  HGBA1C 6.2* 6.8*   Likely with diet, winter, and hospitalization + steroids.  Recommend a Prevnar-20 pneumonia vaccine. If you want to check cost coverage at doctors office or pharmacy.  Referral next time for Colonoscopy [facility] 07/2024 vs Cologuard  Colon Cancer Screening: - For all adults age 61+ routine colon cancer screening is highly recommended.     - Recent guidelines from American [employer] Society recommend starting age of 18 - Early detection of colon cancer is important, because often there are no warning signs or symptoms, also if found early usually it can be cured. Late stage is hard to treat.   - If Cologuard is NEGATIVE, then it is good for 3 years before next due - If Cologuard is POSITIVE, then it is strongly advised to get a Colonoscopy, which allows the GI doctor to locate the source of the cancer or polyp (even very early stage) and treat it by removing it. ------------------------- Follow instructions to collect sample OR TO CHECK COST, you may call the company for any help or questions, 24/7 telephone support at [phone].   You have been referred for a Coronary Calcium Score Cardiac CT Scan. This is a screening test for patients aged 65-50+ with cardiovascular risk factors or who are healthy but would be interested in Cardiovascular Screening for heart disease. Even if there is a family history of heart disease, this imaging can be useful. Typically it can be done every 5+ years or at a different timeline we agree on  The scan will look at the chest and mainly focus on the heart and identify early signs of calcium build up or blockages within the heart arteries. It is not 100% accurate for identifying blockages or heart disease, but it is useful to help us  predict who may have some early changes or be at risk in the future for a heart attack or cardiovascular  problem.  The results are reviewed by a Cardiologist and they will document the results. It should become available on MyChart. Typically the results are divided into percentiles based on other patients of the same demographic and age. So it will compare your risk to others similar to you. If you have a higher score >99 or higher percentile >75%tile, it is recommended to consider Statin cholesterol therapy and or referral to Cardiologist. I will try to help explain your results and if we have questions we can contact the Cardiologist.  You will be contacted for scheduling. Usually it is done at any imaging facility through [facility], [facility] or [facility].  The cost is $99 flat fee total and it does not go through insurance, so no authorization is required.   Please schedule a Follow-up Appointment to: Return for 6 month PreDM A1c.  If you have any other questions or concerns, please feel free to call the office or send a message through MyChart. You may also schedule an earlier appointment if necessary.  Additionally, you may be receiving a survey about your experience at our office within a few days to 1 week by e-mail or mail. We value your feedback.  Marsa Officer, DO [facility], [place]

## 2023-12-08 NOTE — Progress Notes (Signed)
 Subjective:    Patient ID: Seth [name] Given, male    DOB: [birth year], 63 y.o.   MRN: [id number]  Seth [name] is a 63 y.o. male presenting on 12/08/2023 for Annual Exam and Hyperglycemia   HPI  Discussed the use of AI scribe software for clinical note transcription with the patient, who gave verbal consent to proceed.  History of Present Illness    Here for Annual Physical and Lab Review  Pre-Diabetes Overweight BMI 26 Elevated A1c 6.8 Attributes some high sugar to poor diet with bread and other indulgence, and recent hospitalization w/ pneumonioa and cold weather. Prior range A1c up to 6.4,down to 6.2 CBGs: None Meds: Never on med Denies hypoglycemia   Allergic or seasonal Sinusitis Continue Flonase    HYPERLIPIDEMIA: Last lipid 11/2023, LDL 129 - No cholesterol medicine   COPD / TOBACCO ABUSE / Pulmonary Nodules (Left Lower Lobe) Followed by LDCT screening - Active smoker, 1ppd for up to 15 years - Previously quit for 10+ years, then restarted.  He quit cold turkey before. In past tried NRT with limited results - Not ready to quit - Previous dx of COPD with emphysema, without known complication -Albuterol  AS NEEDED. Not on maintenance  Failed chantix NRT   Aortic Atherosclerosis On last LDCT imaging 05/2023   FOLLOW-UP DJD in Cervical and Lumbar Spine / Osteoarthritis in joints hands/knees etc Followed by [facility], Dr Oneil Priestly, recent Lumbar R side microdiscectomy 04/07/18, see op report in chart review. He is doing well post op overall has had some issues and still some problems with generalized aches and pains from arthritis in other joints. Back is improved but still causes some problems. - Describes chronic wear and tear from occupation and old injuries - He has chronic intermittent pain sometimes in neck and upper extremity R > L with some paresthesia and tingling at times, only intermittent with flares - Currently taking Tramadol  50mg  TID - neck  and back - On Baclofen  AS NEEDED - Sheri Friedlander NP at [facility] (Previously [facility]) - for 10 years for neck - Had recent Lumbar MRI. Dx DDD disc - Also taking Venlafaxine  XR 150mg  - 24 hr for low back pain  He reports a recent episode of abdominal and back pain after doing physical work, suspecting a muscle strain. The pain has improved over the past week, and he describes it as discomfort rather than severe pain. No bulging or herniation and no digestive or urinary complaints.  He has a history of vitamin D  insufficiency, which has improved from the previous year. He reports missing the sun and acknowledges absorption issues with vitamin D .    Health Maintenance:  PSA 0.94 (negative) Colon CA Screen last 2015. Due 07/2024 see A&P     11/05/2023   10:15 AM 06/01/2023   11:18 AM 11/12/2021    8:20 AM  Depression screen PHQ 2/9  Decreased Interest 1 0 0  Down, Depressed, Hopeless 0 0 0  PHQ - 2 Score 1 0 0  Altered sleeping 2 0 1  Tired, decreased energy 2 1 1   Change in appetite 1 0 0  Feeling bad or failure about yourself  0 0 0  Trouble concentrating 1 1 1   Moving slowly or fidgety/restless 0 0 0  Suicidal thoughts 0 0 0  PHQ-9 Score 7 2 3   Difficult doing work/chores  Not difficult at all Not difficult at all       11/05/2023   10:15 AM  06/01/2023   11:18 AM 11/12/2021    8:21 AM 09/25/2019    2:02 PM  GAD 7 : Generalized Anxiety Score  Nervous, Anxious, on Edge 0 0 0 0  Control/stop worrying 0 0 0 0  Worry too much - different things 0 0 0 0  Trouble relaxing 0 1 0 0  Restless 1 0 0 0  Easily annoyed or irritable 0 0 0 0  Afraid - awful might happen 0 0 0 0  Total GAD 7 Score 1 1 0 0  Anxiety Difficulty   Not difficult at all      Past Medical History:  Diagnosis Date   Degenerative disc disease, cervical    Degenerative disc disease, lumbar    GERD (gastroesophageal reflux disease)    Leg pain    Neck pain    Wears partial  dentures    Past Surgical History:  Procedure Laterality Date   BLADDER SURGERY  2002   removed scar tissue   CHOLECYSTECTOMY     COLONOSCOPY     LUMBAR LAMINECTOMY/DECOMPRESSION MICRODISCECTOMY N/A 04/07/2018   Procedure: RIGHT - SIDED LUMBAR FOUR- FIVE MICRODISECTOMY.;  Surgeon: Beuford Anes, MD;  Location: [facility] OR;  Service: Orthopedics;  Laterality: N/A;  RIGHT - SIDED LUMBAR FOUR- FIVE MICRODISECTOMY.     MULTIPLE TOOTH EXTRACTIONS     UPPER EXTREMITY ANGIOGRAPHY Left 01/02/2019   Procedure: UPPER EXTREMITY ANGIOGRAPHY;  Surgeon: Marea Selinda RAMAN, MD;  Location: [facility] INVASIVE CV LAB;  Service: Cardiovascular;  Laterality: Left;   Social History   Socioeconomic History   Marital status: Married    Spouse name: Not on file   Number of children: Not on file   Years of education: Not on file   Highest education level: Not on file  Occupational History   Not on file  Tobacco Use   Smoking status: Every Day    Current packs/day: 1.00    Average packs/day: 1 pack/day for 30.0 years (30.0 ttl pk-yrs)    Types: Cigarettes   Smokeless tobacco: Never  Vaping Use   Vaping status: Former  Substance and Sexual Activity   Alcohol use: Yes    Comment: occasionally   Drug use: No   Sexual activity: Yes  Other Topics Concern   Not on file  Social History Narrative   Not on file   Social Drivers of Health   Financial Resource Strain: Not on file  Food Insecurity: Not on file  Transportation Needs: Not on file  Physical Activity: Not on file  Stress: Not on file  Social Connections: Not on file  Intimate Partner Violence: Not on file   Family History  Problem Relation Age of Onset   Diabetes Mother    Cancer Mother        bone   Cancer Father    Heart disease Father    Current Outpatient Medications on File Prior to Visit  Medication Sig   albuterol  (VENTOLIN  HFA) 108 (90 Base) MCG/ACT inhaler Inhale 2 puffs into the lungs every 4 (four) hours as needed for wheezing or  shortness of breath (cough).   baclofen  (LIORESAL ) 10 MG tablet Take 10 mg by mouth 3 (three) times daily as needed.   ipratropium-albuterol  (DUONEB) 0.5-2.5 (3) MG/3ML SOLN Take 3 mLs by nebulization 4 (four) times daily. For next 2 to 3 days and then as needed   ondansetron  (ZOFRAN ) 4 MG tablet Take 1 tablet (4 mg total) by mouth every 6 (six) hours as needed for  nausea.   traMADol  (ULTRAM ) 50 MG tablet Take 50 mg by mouth 4 (four) times daily.   venlafaxine  XR (EFFEXOR -XR) 150 MG 24 hr capsule Take 1 capsule (150 mg total) by mouth daily with breakfast.   fluticasone  (FLONASE ) 50 MCG/ACT nasal spray Place 2 sprays into both nostrils daily. Use for 4-6 weeks then stop and use seasonally or as needed. (Patient not taking: Reported on 11/01/2023)   No current facility-administered medications on file prior to visit.    Review of Systems  Constitutional:  Negative for activity change, appetite change, chills, diaphoresis, fatigue and fever.  HENT:  Negative for congestion and hearing loss.   Eyes:  Negative for visual disturbance.  Respiratory:  Negative for cough, chest tightness, shortness of breath and wheezing.   Cardiovascular:  Negative for chest pain, palpitations and leg swelling.  Gastrointestinal:  Negative for abdominal pain, constipation, diarrhea, nausea and vomiting.  Genitourinary:  Negative for dysuria, frequency and hematuria.  Musculoskeletal:  Negative for arthralgias and neck pain.  Skin:  Negative for rash.  Neurological:  Negative for dizziness, weakness, light-headedness, numbness and headaches.  Hematological:  Negative for adenopathy.  Psychiatric/Behavioral:  Negative for behavioral problems, dysphoric mood and sleep disturbance.    Per HPI unless specifically indicated above     Objective:    BP 134/78   Pulse 93   Ht 6' 3 (1.905 m)   Wt 215 lb (97.5 kg)   SpO2 96%   BMI 26.87 kg/m   Wt Readings from Last 3 Encounters:  12/08/23 215 lb (97.5 kg)   11/05/23 210 lb (95.3 kg)  11/01/23 210 lb (95.3 kg)    Physical Exam Vitals and nursing note reviewed.  Constitutional:      General: He is not in acute distress.    Appearance: He is well-developed. He is not diaphoretic.     Comments: Well-appearing, comfortable, cooperative  HENT:     Head: Normocephalic and atraumatic.  Eyes:     General:        Right eye: No discharge.        Left eye: No discharge.     Conjunctiva/sclera: Conjunctivae normal.     Pupils: Pupils are equal, round, and reactive to light.  Neck:     Thyroid: No thyromegaly.  Cardiovascular:     Rate and Rhythm: Normal rate and regular rhythm.     Pulses: Normal pulses.     Heart sounds: Normal heart sounds. No murmur heard. Pulmonary:     Effort: Pulmonary effort is normal. No respiratory distress.     Breath sounds: Normal breath sounds. No wheezing or rales.  Abdominal:     General: Bowel sounds are normal. There is no distension.     Palpations: Abdomen is soft. There is no mass.     Tenderness: There is no abdominal tenderness.  Musculoskeletal:        General: No tenderness. Normal range of motion.     Cervical back: Normal range of motion and neck supple.     Comments: Upper / Lower Extremities: - Normal muscle tone, strength bilateral upper extremities 5/5, lower extremities 5/5  Lymphadenopathy:     Cervical: No cervical adenopathy.  Skin:    General: Skin is warm and dry.     Findings: No erythema or rash.  Neurological:     Mental Status: He is alert and oriented to person, place, and time.     Comments: Distal sensation intact to light touch all extremities  Psychiatric:        Mood and Affect: Mood normal.        Behavior: Behavior normal.        Thought Content: Thought content normal.     Comments: Well groomed, good eye contact, normal speech and thoughts     Results for orders placed or performed in visit on 12/01/23  VITAMIN D  25 Hydroxy (Vit-D Deficiency, Fractures)    Collection Time: 12/01/23  8:03 AM  Result Value Ref Range   Vit D, 25-Hydroxy 26 (L) 30 - 100 ng/mL  TSH   Collection Time: 12/01/23  8:03 AM  Result Value Ref Range   TSH 2.36 0.40 - 4.50 mIU/L  PSA   Collection Time: 12/01/23  8:03 AM  Result Value Ref Range   PSA 0.94 < OR = 4.00 ng/mL  Lipid panel   Collection Time: 12/01/23  8:03 AM  Result Value Ref Range   Cholesterol 188 <200 mg/dL   HDL 36 (L) > OR = 40 mg/dL   Triglycerides 879 <849 mg/dL   LDL Cholesterol (Calc) 129 (H) mg/dL (calc)   Total CHOL/HDL Ratio 5.2 (H) <5.0 (calc)   Non-HDL Cholesterol (Calc) 152 (H) <130 mg/dL (calc)  CBC with Differential/Platelet   Collection Time: 12/01/23  8:03 AM  Result Value Ref Range   WBC 7.8 3.8 - 10.8 Thousand/uL   RBC 4.65 4.20 - 5.80 Million/uL   Hemoglobin 14.3 13.2 - 17.1 g/dL   HCT 57.8 61.4 - 49.9 %   MCV 90.5 80.0 - 100.0 fL   MCH 30.8 27.0 - 33.0 pg   MCHC 34.0 32.0 - 36.0 g/dL   RDW 87.6 88.9 - 84.9 %   Platelets 322 140 - 400 Thousand/uL   MPV 10.2 7.5 - 12.5 fL   Neutro Abs 4,306 1,500 - 7,800 cells/uL   Absolute Lymphocytes 2,457 850 - 3,900 cells/uL   Absolute Monocytes 655 200 - 950 cells/uL   Eosinophils Absolute 281 15 - 500 cells/uL   Basophils Absolute 101 0 - 200 cells/uL   Neutrophils Relative % 55.2 %   Total Lymphocyte 31.5 %   Monocytes Relative 8.4 %   Eosinophils Relative 3.6 %   Basophils Relative 1.3 %  Hemoglobin A1c   Collection Time: 12/01/23  8:03 AM  Result Value Ref Range   Hgb A1c MFr Bld 6.8 (H) <5.7 % of total Hgb   Mean Plasma Glucose 148 mg/dL   eAG (mmol/L) 8.2 mmol/L  COMPLETE METABOLIC PANEL WITH GFR   Collection Time: 12/01/23  8:03 AM  Result Value Ref Range   Glucose, Bld 110 (H) 65 - 99 mg/dL   BUN 21 7 - 25 mg/dL   Creat 8.72 9.29 - 8.64 mg/dL   eGFR 65 > OR = 60 fO/fpw/8.26f7   BUN/Creatinine Ratio SEE NOTE: 6 - 22 (calc)   Sodium 140 135 - 146 mmol/L   Potassium 4.6 3.5 - 5.3 mmol/L   Chloride 106 98 - 110  mmol/L   CO2 28 20 - 32 mmol/L   Calcium 9.4 8.6 - 10.3 mg/dL   Total Protein 6.9 6.1 - 8.1 g/dL   Albumin 4.3 3.6 - 5.1 g/dL   Globulin 2.6 1.9 - 3.7 g/dL (calc)   AG Ratio 1.7 1.0 - 2.5 (calc)   Total Bilirubin 0.5 0.2 - 1.2 mg/dL   Alkaline phosphatase (APISO) 57 35 - 144 U/L   AST 16 10 - 35 U/L   ALT 20 9 - 46 U/L  Assessment & Plan:   Problem List Items Addressed This Visit     Aortic atherosclerosis (HCC)   Centrilobular emphysema (HCC)   Hyperlipidemia   Relevant Orders   CT CARDIAC SCORING (SELF PAY ONLY)   Pre-diabetes   Other Visit Diagnoses       Annual physical exam    -  Primary     Vitamin D  deficiency            Updated Health Maintenance information Reviewed recent lab results with patient Encouraged improvement to lifestyle with diet and exercise Goal of weight loss   Prediabetes A1c elevated into diabetes range 6.8, likely due to recent illness and steroid use. Discussed the importance of diet and weight loss in managing blood sugar levels. -Plan to recheck A1c in 6 months (August 2025) to assess for improvement. -If >6.5 again would diagnosed T2DM  Emphysema / COPD Stable, with recent hospitalization. Discussed the benefits of receiving the pneumonia vaccine due to COPD history. -Continue Albuterol  AS NEEDED -Consider maintenance therapy -Recommend Prevnar 20 pneumonia vaccine at pharmacy or doctor's office.  Colon Cancer Screening Last colonoscopy performed in 2015 with no significant findings. Discussed the need for repeat colonoscopy in 2025. -Plan to schedule colonoscopy with [facility] in September 2025. Vs Consider Cologuard as an alternative screening method.  Cardiovascular Risk Discussed the benefits of a CT Coronary calcium heart scan to assess for blockages in the arteries. Order CT Scan Open to the possibility of starting a statin depending on the results.  Possible Abdominal wall strain Reported discomfort in the abdomen  after physical exertion, with improvement over time. No signs of herniation. -Advise to monitor for recurrence of pain or signs of herniation.  General Health Maintenance -Continue low dose lung scan for surveillance. -Consider Vitamin D  supplementation due to insufficiency. -Follow-up appointment scheduled for June 08, 2024.         Orders Placed This Encounter  Procedures   CT CARDIAC SCORING (SELF PAY ONLY)    Standing Status:   Future    Expiration Date:   12/07/2024    Preferred imaging location?:    [facility]    No orders of the defined types were placed in this encounter.    Follow up plan: Return for 6 month PreDM A1c.  Marsa Officer, DO [facility] 12/08/2023, 8:38 AM

## 2023-12-10 ENCOUNTER — Encounter: Payer: Self-pay | Admitting: Family Medicine

## 2024-01-03 ENCOUNTER — Other Ambulatory Visit: Payer: Self-pay | Admitting: Family Medicine

## 2024-01-03 DIAGNOSIS — G8929 Other chronic pain: Secondary | ICD-10-CM

## 2024-01-04 ENCOUNTER — Other Ambulatory Visit: Payer: Self-pay | Admitting: Family Medicine

## 2024-01-04 DIAGNOSIS — G8929 Other chronic pain: Secondary | ICD-10-CM

## 2024-01-04 MED ORDER — VENLAFAXINE HCL ER 150 MG PO CP24: 150.0000 mg | ORAL_CAPSULE | Freq: Every day | ORAL | 3 refills | Status: AC

## 2024-01-04 NOTE — Telephone Encounter (Signed)
 Duplicate request, last refilled 01/04/24.  Requested Prescriptions  Pending Prescriptions Disp Refills   venlafaxine XR (EFFEXOR-XR) 150 MG 24 hr capsule [Pharmacy Med Name: VENLAFAXINE ER 150MG  CAPSULES] 90 capsule 3    Sig: TAKE 1 CAPSULE(150 MG) BY MOUTH DAILY WITH BREAKFAST     Psychiatry: Antidepressants - SNRI - desvenlafaxine & venlafaxine Failed - 01/04/2024  2:04 PM      Failed - Lipid Panel in normal range within the last 12 months    Cholesterol  Date Value Ref Range Status  12/01/2023 188 <200 mg/dL Final   LDL Cholesterol (Calc)  Date Value Ref Range Status  12/01/2023 129 (H) mg/dL (calc) Final    Comment:    Reference range: <100 . Desirable range <100 mg/dL for primary prevention;   <70 mg/dL for patients with CHD or diabetic patients  with > or = 2 CHD risk factors. Marland Kitchen LDL-C is now calculated using the Martin-Hopkins  calculation, which is a validated novel method providing  better accuracy than the Friedewald equation in the  estimation of LDL-C.  Horald Pollen et al. Lenox Ahr. 8657;846(96): 2061-2068  (http://education.[employer].com/faq/FAQ164)    HDL  Date Value Ref Range Status  12/01/2023 36 (L) > OR = 40 mg/dL Final   Triglycerides  Date Value Ref Range Status  12/01/2023 120 <150 mg/dL Final         Passed - Cr in normal range and within 360 days    Creat  Date Value Ref Range Status  12/01/2023 1.27 0.70 - 1.35 mg/dL Final         Passed - Completed PHQ-2 or PHQ-9 in the last 360 days      Passed - Last BP in normal range    BP Readings from Last 1 Encounters:  12/08/23 134/78         Passed - Valid encounter within last 6 months    Recent Outpatient Visits           2 months ago Multifocal pneumonia   [place] [facility] Smitty Cords, DO   7 months ago Pre-diabetes   [place] [facility] Althea Charon, Netta Neat, DO   1 year ago Annual physical exam   [place] [facility] Smitty Cords, DO   2 years ago Annual physical exam   [place] [facility] Smitty Cords, DO   2 years ago Pre-diabetes   [place] [facility] Althea Charon, Netta Neat, DO       Future Appointments             In 5 months Althea Charon, Netta Neat, DO [facility]

## 2024-01-04 NOTE — Telephone Encounter (Signed)
 Copied from CRM [id number]. Topic: Clinical - Medication Refill >> Jan 04, 2024  1:13 PM Everette Rank wrote: Most Recent Primary Care Visit:  Provider: [facility]  Department: [facility]  Visit Type: LAB  Date: 12/01/2023  Medication: venlafaxine XR [facility])  Has the patient contacted their pharmacy? Yes (Agent: If no, request that the patient contact the pharmacy for the refill. If patient does not wish to contact the pharmacy document the reason why and proceed with request.) (Agent: If yes, when and what did the pharmacy advise?)  Is this the correct pharmacy for this prescription? Yes If no, delete pharmacy and type the correct one.  This is the patient's preferred pharmacy:  [id number] - [place], [place] - [address] AT [facility] [address] [place] [address] Phone: [phone] Fax: [phone]   Has the prescription been filled recently? No  Is the patient out of the medication? Yes  Has the patient been seen for an appointment in the last year OR does the patient have an upcoming appointment? Yes  Can we respond through MyChart? Yes  Agent: Please be advised that Rx refills may take up to 3 business days. We ask that you follow-up with your pharmacy.

## 2024-04-18 ENCOUNTER — Ambulatory Visit (INDEPENDENT_AMBULATORY_CARE_PROVIDER_SITE_OTHER)

## 2024-04-18 ENCOUNTER — Other Ambulatory Visit: Payer: Self-pay | Admitting: Podiatry

## 2024-04-18 ENCOUNTER — Ambulatory Visit: Admitting: Podiatry

## 2024-04-18 DIAGNOSIS — Q828 Other specified congenital malformations of skin: Secondary | ICD-10-CM | POA: Diagnosis not present

## 2024-04-18 DIAGNOSIS — M216X2 Other acquired deformities of left foot: Secondary | ICD-10-CM

## 2024-04-18 DIAGNOSIS — M216X1 Other acquired deformities of right foot: Secondary | ICD-10-CM

## 2024-04-18 NOTE — Progress Notes (Unsigned)
 Left third floating osteotomy right second floating osteotomy

## 2024-05-01 ENCOUNTER — Telehealth: Payer: Self-pay | Admitting: Podiatry

## 2024-05-01 NOTE — Telephone Encounter (Signed)
 Received surgical consent form.  Left message for pt to call to schedule his surgery with Dr Tobie

## 2024-05-09 ENCOUNTER — Telehealth: Payer: Self-pay | Admitting: Podiatry

## 2024-05-09 NOTE — Telephone Encounter (Signed)
 Pt returned call and left message to get scheduled for surgery and has some questions as well.  I returned call and pt is scheduled for 06/19/24 for surgery. He is not on any blood thinners or glp1 medications and pharmacy is correct in chart.  He is asking for a quote for cpt code 71691 bilateral.

## 2024-06-08 ENCOUNTER — Ambulatory Visit: Payer: BC Managed Care – PPO | Admitting: Family Medicine

## 2024-06-09 ENCOUNTER — Telehealth: Payer: Self-pay

## 2024-06-09 NOTE — Telephone Encounter (Signed)
 Copied from CRM #[id number]. Topic: Clinical - Request for Lab/Test Order >> Jun 09, 2024  8:07 AM Larissa RAMAN wrote: Reason for CRM: Patient requesting labs to have labs completed before 09/02 appointment. Please contact for scheduling.

## 2024-06-13 ENCOUNTER — Telehealth: Payer: Self-pay | Admitting: Podiatry

## 2024-06-13 NOTE — Telephone Encounter (Signed)
 AUTH VALID 06/19/2024-08/17/2024

## 2024-06-13 NOTE — Telephone Encounter (Signed)
 DOS- 06/19/2024  METATARSAL OSTEOTOMY BIL 2-5- 28308  [employer] EFFECTIVE DATE- 10/03/2023  DEDUCTIBLE- $4000 ACCUMULATED- $1047.87 OOP- $6000 ACCUMULATED- $2356.73 COINSURANCE- 20% REF# [id number]  PER TASHEKA D WITH [employer], PRIOR AUTH HAS BEEN APPROVED FOR CPT CODE 71691.  AUTH# [id number] REF# VINIE BIRCH 06/13/2024 3:48 PM DOCUMENTATION ATTACHED TO SURGERY CONSENT PACKET.

## 2024-06-21 ENCOUNTER — Other Ambulatory Visit: Payer: Self-pay | Admitting: Family Medicine

## 2024-06-21 DIAGNOSIS — E559 Vitamin D deficiency, unspecified: Secondary | ICD-10-CM

## 2024-06-21 DIAGNOSIS — R7303 Prediabetes: Secondary | ICD-10-CM

## 2024-06-21 DIAGNOSIS — E782 Mixed hyperlipidemia: Secondary | ICD-10-CM

## 2024-06-22 ENCOUNTER — Other Ambulatory Visit

## 2024-06-23 LAB — BASIC METABOLIC PANEL WITH GFR
BUN: 18 mg/dL (ref 7–25)
CO2: 28 mmol/L (ref 20–32)
Calcium: 9.5 mg/dL (ref 8.6–10.3)
Chloride: 104 mmol/L (ref 98–110)
Creat: 1.21 mg/dL (ref 0.70–1.35)
Glucose, Bld: 116 mg/dL — ABNORMAL HIGH (ref 65–99)
Potassium: 4.9 mmol/L (ref 3.5–5.3)
Sodium: 139 mmol/L (ref 135–146)
eGFR: 68 mL/min/1.73m2 (ref >=60)

## 2024-06-23 LAB — LIPID PANEL
Cholesterol: 167 mg/dL (ref <200)
HDL: 39 mg/dL — ABNORMAL LOW (ref >=40)
LDL Cholesterol (Calc): 109 mg/dL — ABNORMAL HIGH
Non-HDL Cholesterol (Calc): 128 mg/dL (ref <130)
Total CHOL/HDL Ratio: 4.3 (calc) (ref <5.0)
Triglycerides: 95 mg/dL (ref <150)

## 2024-06-23 LAB — VITAMIN D 25 HYDROXY (VIT D DEFICIENCY, FRACTURES): Vit D, 25-Hydroxy: 35 ng/mL (ref 30–100)

## 2024-06-23 LAB — HEMOGLOBIN A1C
Hgb A1c MFr Bld: 6.2 % — ABNORMAL HIGH (ref <5.7)
Mean Plasma Glucose: 131 mg/dL
eAG (mmol/L): 7.3 mmol/L

## 2024-06-27 ENCOUNTER — Encounter: Admitting: Podiatry

## 2024-07-04 ENCOUNTER — Other Ambulatory Visit: Payer: Self-pay | Admitting: Family Medicine

## 2024-07-04 ENCOUNTER — Ambulatory Visit: Admitting: Family Medicine

## 2024-07-04 ENCOUNTER — Encounter: Payer: Self-pay | Admitting: Family Medicine

## 2024-07-04 VITALS — BP 132/84 | HR 91 | Ht 75.0 in | Wt 210.1 lb

## 2024-07-04 DIAGNOSIS — I700 Atherosclerosis of aorta: Secondary | ICD-10-CM

## 2024-07-04 DIAGNOSIS — R7303 Prediabetes: Secondary | ICD-10-CM

## 2024-07-04 DIAGNOSIS — E559 Vitamin D deficiency, unspecified: Secondary | ICD-10-CM

## 2024-07-04 DIAGNOSIS — Z0000 Encounter for general adult medical examination without abnormal findings: Secondary | ICD-10-CM

## 2024-07-04 DIAGNOSIS — J432 Centrilobular emphysema: Secondary | ICD-10-CM

## 2024-07-04 DIAGNOSIS — E782 Mixed hyperlipidemia: Secondary | ICD-10-CM

## 2024-07-04 DIAGNOSIS — Z23 Encounter for immunization: Secondary | ICD-10-CM | POA: Diagnosis not present

## 2024-07-04 DIAGNOSIS — R351 Nocturia: Secondary | ICD-10-CM

## 2024-07-04 NOTE — Patient Instructions (Addendum)
 Thank you for coming to the office today.  Prevnar-20 vaccine today (pneumonia)  Future Flu Shot when available.   Due for Colonoscopy, please let me know when ready to pursue  And which location you prefer  [facility] [place] or [facility] [place]  Please schedule a Follow-up Appointment to: No follow-ups on file.  If you have any other questions or concerns, please feel free to call the office or send a message through MyChart. You may also schedule an earlier appointment if necessary.  Additionally, you may be receiving a survey about your experience at our office within a few days to 1 week by e-mail or mail. We value your feedback.  Marsa Officer, DO [facility], [place]

## 2024-07-04 NOTE — Progress Notes (Signed)
 Subjective:    Patient ID: Seth [name], male    DOB: 02/17/1961, 63 y.o.   MRN: [id number]  Seth [name] is a 63 y.o. male presenting on 07/04/2024 for Medical Management of Chronic Issues   HPI  Discussed the use of AI scribe software for clinical note transcription with the patient, who gave verbal consent to proceed.  History of Present Illness   Seth [name] is a 63 year old male who presents for a routine follow-up and management of multiple chronic conditions.   Hyperlipidemia - LDL cholesterol decreased from 129 to 109  Vitamin d  deficiency - Vitamin D  level increased from 19 to 35  Colorectal cancer screening - Last colonoscopy performed in 2015 - Plans to schedule next colonoscopy after upcoming foot surgery  Foot pain and surgical planning - Long-standing foot issues under evaluation by podiatrist - Considering consultation with orthopedic specialist - Plans for foot surgery  Cardiopulmonary imaging - Chest CT scan ordered in February to evaluate heart and lungs - Scan not yet completed due to work obligations      Pre-Diabetes Overweight BMI 26 - Hemoglobin A1c improved from 6.8 to 6.2 Not diagnosed with Diabetes Improved diet and lifestyle management CBGs: None Meds: Never on med Denies hypoglycemia   Allergic or seasonal Sinusitis Continue Flonase    COPD / TOBACCO ABUSE / Pulmonary Nodules (Left Lower Lobe) Followed by LDCT screening - Active smoker, 1ppd for up to 15 years - Previously quit for 10+ years, then restarted.  He quit cold [place] before. In past tried NRT with limited results - Not ready to quit - Previous dx of COPD with emphysema, without known complication -Albuterol  AS NEEDED. Not on maintenance  Failed chantix NRT   Aortic Atherosclerosis On last LDCT imaging 05/2023   FOLLOW-UP DJD in Cervical and Lumbar Spine / Osteoarthritis in joints hands/knees etc Followed by [facility], Dr Oneil Priestly, recent  Lumbar R side microdiscectomy 04/07/18, see op report in chart review. He is doing well post op overall has had some issues and still some problems with generalized aches and pains from arthritis in other joints. Back is improved but still causes some problems. - Describes chronic wear and tear from occupation and old injuries - He has chronic intermittent pain sometimes in neck and upper extremity R > L with some paresthesia and tingling at times, only intermittent with flares - Currently taking Tramadol  50mg  TID - neck and back - On Baclofen  AS NEEDED - Sheri Friedlander NP at [facility] (Previously [facility]) - for 10 years for neck - Had recent Lumbar MRI. Dx DDD disc - Also taking Venlafaxine  XR 150mg  - 24 hr for low back pain  Updates Chronic neck and back pain - Ongoing neck and back pain with associated headaches - Pain radiates to arms - Considering injections and radiofrequency ablation (RFA) for symptom relief, which have been beneficial in the past - Currently managing pain with tramadol  - Considering addition of gabapentin or Lyrica   Podiatry Has upcoming procedure / surgery scheduled   Health Maintenance:   PSA 0.94 (negative) Colon CA Screen last 2015. Due 07/2024 see A&P  Flu Shot when available.  Prevnar-20 today       07/04/2024    8:12 AM 07/04/2024    8:11 AM 11/05/2023   10:15 AM  Depression screen PHQ 2/9  Decreased Interest 1 1 1   Down, Depressed, Hopeless 0 0 0  PHQ - 2 Score 1 1 1  Altered sleeping 1  2  Tired, decreased energy 1  2  Change in appetite 0  1  Feeling bad or failure about yourself  0  0  Trouble concentrating 1  1  Moving slowly or fidgety/restless 0  0  Suicidal thoughts 0  0  PHQ-9 Score 4  7  Difficult doing work/chores Not difficult at all         07/04/2024    8:12 AM 11/05/2023   10:15 AM 06/01/2023   11:18 AM 11/12/2021    8:21 AM  GAD 7 : Generalized Anxiety Score  Nervous, Anxious, on Edge 0 0 0 0   Control/stop worrying 0 0 0 0  Worry too much - different things 0 0 0 0  Trouble relaxing 0 0 1 0  Restless 0 1 0 0  Easily annoyed or irritable 0 0 0 0  Afraid - awful might happen 0 0 0 0  Total GAD 7 Score 0 1 1 0  Anxiety Difficulty    Not difficult at all    Social History   Tobacco Use   Smoking status: Every Day    Current packs/day: 1.00    Average packs/day: 1 pack/day for 30.0 years (30.0 ttl pk-yrs)    Types: Cigarettes   Smokeless tobacco: Never  Vaping Use   Vaping status: Former  Substance Use Topics   Alcohol use: Yes    Comment: occasionally   Drug use: No    Review of Systems Per HPI unless specifically indicated above     Objective:    BP 132/84 (BP Location: Left Arm, Patient Position: Sitting, Cuff Size: Normal)   Pulse 91   Ht 6' 3 (1.905 m)   Wt 210 lb 2 oz (95.3 kg)   SpO2 94%   BMI 26.26 kg/m   Wt Readings from Last 3 Encounters:  07/04/24 210 lb 2 oz (95.3 kg)  12/08/23 215 lb (97.5 kg)  11/05/23 210 lb (95.3 kg)    Physical Exam Vitals and nursing note reviewed.  Constitutional:      General: He is not in acute distress.    Appearance: Normal appearance. He is well-developed. He is not diaphoretic.     Comments: Well-appearing, comfortable, cooperative  HENT:     Head: Normocephalic and atraumatic.  Eyes:     General:        Right eye: No discharge.        Left eye: No discharge.     Conjunctiva/sclera: Conjunctivae normal.  Cardiovascular:     Rate and Rhythm: Normal rate.  Pulmonary:     Effort: Pulmonary effort is normal.  Skin:    General: Skin is warm and dry.     Findings: No erythema or rash.  Neurological:     Mental Status: He is alert and oriented to person, place, and time.  Psychiatric:        Mood and Affect: Mood normal.        Behavior: Behavior normal.        Thought Content: Thought content normal.     Comments: Well groomed, good eye contact, normal speech and thoughts    Recent Labs     12/01/23 0803 06/22/24 0845  HGBA1C 6.8* 6.2*     Results for orders placed or performed in visit on 06/21/24  Hemoglobin A1c   Collection Time: 06/22/24  8:45 AM  Result Value Ref Range   Hgb A1c MFr Bld 6.2 (H) <5.7 %  Mean Plasma Glucose 131 mg/dL   eAG (mmol/L) 7.3 mmol/L  Lipid panel   Collection Time: 06/22/24  8:45 AM  Result Value Ref Range   Cholesterol 167 <200 mg/dL   HDL 39 (L) > OR = 40 mg/dL   Triglycerides 95 <849 mg/dL   LDL Cholesterol (Calc) 109 (H) mg/dL (calc)   Total CHOL/HDL Ratio 4.3 <5.0 (calc)   Non-HDL Cholesterol (Calc) 128 <130 mg/dL (calc)  Basic metabolic panel with GFR   Collection Time: 06/22/24  8:45 AM  Result Value Ref Range   Glucose, Bld 116 (H) 65 - 99 mg/dL   BUN 18 7 - 25 mg/dL   Creat 8.78 9.29 - 8.64 mg/dL   eGFR 68 > OR = 60 fO/fpw/8.26f7   BUN/Creatinine Ratio SEE NOTE: 6 - 22 (calc)   Sodium 139 135 - 146 mmol/L   Potassium 4.9 3.5 - 5.3 mmol/L   Chloride 104 98 - 110 mmol/L   CO2 28 20 - 32 mmol/L   Calcium 9.5 8.6 - 10.3 mg/dL  VITAMIN D  25 Hydroxy (Vit-D Deficiency, Fractures)   Collection Time: 06/22/24  8:45 AM  Result Value Ref Range   Vit D, 25-Hydroxy 35 30 - 100 ng/mL      Assessment & Plan:   Problem List Items Addressed This Visit     Aortic atherosclerosis (HCC)   Centrilobular emphysema (HCC)   Hyperlipidemia   Relevant Orders   CT CARDIAC SCORING (SELF PAY ONLY)   Pre-diabetes - Primary   Other Visit Diagnoses       Need for Streptococcus pneumoniae vaccination       Relevant Orders   Pneumococcal conjugate vaccine 20-valent (Completed)         Chronic neck pain with radiculopathy Chronic neck pain with radiculopathy, causing headaches and arm symptoms. Previous injections and RFA provided relief, but symptoms are returning. - Continue with planned injection and RFA for neck pain management.  Chronic low back pain with sciatica Chronic low back pain with sciatica, causing significant  discomfort and functional limitations. Insurance issues have delayed certain procedures. Pain management team is considering gabapentin or Lyrica for nerve pain management. - Discuss potential use of gabapentin or Lyrica for nerve pain management with pain management team.  Foot pain, planned for surgery Chronic foot pain with planned surgical intervention. Surgery is prioritized before scheduling other procedures. - Proceed with planned foot surgery.  Osteoarthritis Osteoarthritis with ongoing management challenges. Insurance has denied certain procedures.  Pre-diabetes Major improvement in A1c control 6.8 down to 6.2% Not diagnosed with diabetes  Aortic Atherosclerosis On imaging Managing lifestyle factors. Not on statin at this time.  Hyperlipidemia Hyperlipidemia with improved lipid profile. LDL cholesterol has decreased from 129 mg/dL to 890 mg/dL.  Vitamin D  deficiency Vitamin D  deficiency with improved levels. Vitamin D  has increased from 19 to 35 ng/mL.        Orders Placed This Encounter  Procedures   CT CARDIAC SCORING (SELF PAY ONLY)    Standing Status:   Future    Expiration Date:   07/04/2025    Preferred imaging location?:   [facility]   Pneumococcal conjugate vaccine 20-valent    No orders of the defined types were placed in this encounter.   Follow up plan: Return for 6 month fasting lab > 1 week later Annual Physical.  Future labs ordered for 01/02/25  Marsa Officer, DO [facility] 07/04/2024, 8:36 AM

## 2024-07-11 ENCOUNTER — Encounter: Admitting: Podiatry

## 2024-07-13 ENCOUNTER — Ambulatory Visit: Payer: Self-pay | Admitting: Family Medicine

## 2024-07-13 ENCOUNTER — Ambulatory Visit
Admission: RE | Admit: 2024-07-13 | Discharge: 2024-07-13 | Disposition: A | Discharge status: Home / Self Care | Payer: Self-pay | Source: Ambulatory Visit | Attending: Family Medicine | Admitting: Family Medicine

## 2024-07-13 DIAGNOSIS — E782 Mixed hyperlipidemia: Secondary | ICD-10-CM | POA: Insufficient documentation

## 2024-07-21 ENCOUNTER — Other Ambulatory Visit: Payer: Self-pay | Admitting: Podiatry

## 2024-07-21 DIAGNOSIS — M21542 Acquired clubfoot, left foot: Secondary | ICD-10-CM | POA: Diagnosis not present

## 2024-07-21 DIAGNOSIS — M21541 Acquired clubfoot, right foot: Secondary | ICD-10-CM | POA: Diagnosis not present

## 2024-07-21 MED ORDER — IBUPROFEN 800 MG PO TABS: 800.0000 mg | ORAL_TABLET | Freq: Four times a day (QID) | ORAL | 1 refills | Status: AC | PRN

## 2024-07-21 MED ORDER — OXYCODONE-ACETAMINOPHEN 5-325 MG PO TABS: 1.0000 | ORAL_TABLET | ORAL | 0 refills | Status: AC | PRN

## 2024-07-27 ENCOUNTER — Other Ambulatory Visit: Payer: Self-pay | Admitting: Podiatry

## 2024-07-27 ENCOUNTER — Ambulatory Visit (INDEPENDENT_AMBULATORY_CARE_PROVIDER_SITE_OTHER): Payer: Self-pay | Admitting: Podiatry

## 2024-07-27 ENCOUNTER — Ambulatory Visit (INDEPENDENT_AMBULATORY_CARE_PROVIDER_SITE_OTHER)

## 2024-07-27 DIAGNOSIS — Z9889 Other specified postprocedural states: Secondary | ICD-10-CM

## 2024-07-27 DIAGNOSIS — M216X1 Other acquired deformities of right foot: Secondary | ICD-10-CM

## 2024-07-27 DIAGNOSIS — M216X2 Other acquired deformities of left foot: Secondary | ICD-10-CM

## 2024-07-27 NOTE — Progress Notes (Signed)
 Subjective:  Patient ID: Seth [name], male    DOB: [birth year],  MRN: [id number]  Chief Complaint  Patient presents with   Routine Post Op    POV # 1 DOS 07/21/24 BIL FLOATING OSTEOTOMY    DOS: 07/21/2024 Procedure: Right second and left third floating osteotomy  63 y.o. male returns for post-op check.  Patient states he is doing well pain is controlled no infection noted.  No nausea fever chills vomiting is ambulating with bilateral surgical shoe  Review of Systems: Negative except as noted in the HPI. Denies N/V/F/Ch.  Past Medical History:  Diagnosis Date   Degenerative disc disease, cervical    Degenerative disc disease, lumbar    GERD (gastroesophageal reflux disease)    Leg pain    Neck pain    Wears partial dentures     Current Outpatient Medications:    albuterol  (VENTOLIN  HFA) 108 (90 Base) MCG/ACT inhaler, Inhale 2 puffs into the lungs every 4 (four) hours as needed for wheezing or shortness of breath (cough)., Disp: 1 each, Rfl: 2   baclofen  (LIORESAL ) 10 MG tablet, Take 10 mg by mouth 3 (three) times daily as needed., Disp: , Rfl:    fluticasone  (FLONASE ) 50 MCG/ACT nasal spray, Place 2 sprays into both nostrils daily. Use for 4-6 weeks then stop and use seasonally or as needed., Disp: 16 g, Rfl: 3   ibuprofen  (ADVIL ) 800 MG tablet, Take 1 tablet (800 mg total) by mouth every 6 (six) hours as needed., Disp: 60 tablet, Rfl: 1   ipratropium-albuterol  (DUONEB) 0.5-2.5 (3) MG/3ML SOLN, Take 3 mLs by nebulization 4 (four) times daily. For next 2 to 3 days and then as needed, Disp: 360 mL, Rfl: 1   oxyCODONE -acetaminophen  (PERCOCET) 5-325 MG tablet, Take 1 tablet by mouth every 4 (four) hours as needed for severe pain (pain score 7-10)., Disp: 30 tablet, Rfl: 0   traMADol  (ULTRAM ) 50 MG tablet, Take 50 mg by mouth 4 (four) times daily., Disp: , Rfl:    venlafaxine  XR (EFFEXOR -XR) 150 MG 24 hr capsule, Take 1 capsule (150 mg total) by mouth daily with breakfast., Disp: 90  capsule, Rfl: 3  Social History   Tobacco Use  Smoking Status Every Day   Current packs/day: 1.00   Average packs/day: 1 pack/day for 30.0 years (30.0 ttl pk-yrs)   Types: Cigarettes  Smokeless Tobacco Never    No Known Allergies Objective:  There were no vitals filed for this visit. There is no height or weight on file to calculate BMI. Constitutional Well developed. Well nourished.  Vascular Foot warm and well perfused. Capillary refill normal to all digits.   Neurologic Normal speech. Oriented to person, place, and time. Epicritic sensation to light touch grossly present bilaterally.  Dermatologic Skin healing well without signs of infection. Skin edges well coapted without signs of infection.  Orthopedic: Tenderness to palpation noted about the surgical site.   Radiographs: 3 views of skeletally mature adult bilateral foot: Osteotomy of the right second metatarsal noted and osteotomy of the left third metatarsal noted both in good position and alignment.  Reduction of deformity noted. Assessment:   1. Plantar flexed metatarsal bone of right foot   2. Plantar flexed metatarsal bone of left foot    Plan:  Patient was evaluated and treated and all questions answered.  S/p foot surgery bilaterally -Progressing as expected post-operatively. -XR: See above -WB Status: Weightbearing as tolerated in surgical shoe -Sutures: Intact.  No clinical signs of dehiscence noted  no complication noted. -Medications: None -Foot redressed.  No follow-ups on file.

## 2024-08-07 DIAGNOSIS — Z0279 Encounter for issue of other medical certificate: Secondary | ICD-10-CM

## 2024-08-10 ENCOUNTER — Ambulatory Visit (INDEPENDENT_AMBULATORY_CARE_PROVIDER_SITE_OTHER): Admitting: Podiatry

## 2024-08-10 ENCOUNTER — Encounter: Payer: Self-pay | Admitting: Podiatry

## 2024-08-10 DIAGNOSIS — Q666 Other congenital valgus deformities of feet: Secondary | ICD-10-CM | POA: Diagnosis not present

## 2024-08-10 DIAGNOSIS — M216X1 Other acquired deformities of right foot: Secondary | ICD-10-CM

## 2024-08-10 DIAGNOSIS — M216X2 Other acquired deformities of left foot: Secondary | ICD-10-CM

## 2024-08-10 NOTE — Progress Notes (Signed)
 Subjective:  Patient ID: Seth [name], male    DOB: 10/20/1961,  MRN: [id number]  Chief Complaint  Patient presents with   Routine Post Op    POV # 2 DOS 07/21/24 BIL FLOATING OSTEOTOMY    DOS: 07/21/2024 Procedure: Right second and left third floating osteotomy  63 y.o. male returns for post-op check.  Patient states he is doing well no further pain.  Much improved.  He is here to get his stitches out  Review of Systems: Negative except as noted in the HPI. Denies N/V/F/Ch.  Past Medical History:  Diagnosis Date   Degenerative disc disease, cervical    Degenerative disc disease, lumbar    GERD (gastroesophageal reflux disease)    Leg pain    Neck pain    Wears partial dentures     Current Outpatient Medications:    albuterol  (VENTOLIN  HFA) 108 (90 Base) MCG/ACT inhaler, Inhale 2 puffs into the lungs every 4 (four) hours as needed for wheezing or shortness of breath (cough)., Disp: 1 each, Rfl: 2   baclofen  (LIORESAL ) 10 MG tablet, Take 10 mg by mouth 3 (three) times daily as needed., Disp: , Rfl:    fluticasone  (FLONASE ) 50 MCG/ACT nasal spray, Place 2 sprays into both nostrils daily. Use for 4-6 weeks then stop and use seasonally or as needed., Disp: 16 g, Rfl: 3   ibuprofen  (ADVIL ) 800 MG tablet, Take 1 tablet (800 mg total) by mouth every 6 (six) hours as needed., Disp: 60 tablet, Rfl: 1   ipratropium-albuterol  (DUONEB) 0.5-2.5 (3) MG/3ML SOLN, Take 3 mLs by nebulization 4 (four) times daily. For next 2 to 3 days and then as needed, Disp: 360 mL, Rfl: 1   oxyCODONE -acetaminophen  (PERCOCET) 5-325 MG tablet, Take 1 tablet by mouth every 4 (four) hours as needed for severe pain (pain score 7-10)., Disp: 30 tablet, Rfl: 0   traMADol  (ULTRAM ) 50 MG tablet, Take 50 mg by mouth 4 (four) times daily., Disp: , Rfl:    venlafaxine  XR (EFFEXOR -XR) 150 MG 24 hr capsule, Take 1 capsule (150 mg total) by mouth daily with breakfast., Disp: 90 capsule, Rfl: 3  Social History   Tobacco  Use  Smoking Status Every Day   Current packs/day: 1.00   Average packs/day: 1 pack/day for 30.0 years (30.0 ttl pk-yrs)   Types: Cigarettes  Smokeless Tobacco Never    No Known Allergies Objective:  There were no vitals filed for this visit. There is no height or weight on file to calculate BMI. Constitutional Well developed. Well nourished.  Vascular Foot warm and well perfused. Capillary refill normal to all digits.   Neurologic Normal speech. Oriented to person, place, and time. Epicritic sensation to light touch grossly present bilaterally.  Dermatologic Skin completely epithelialized no signs of dehiscence or no complication noted.  Reduction of deformity noted.  Orthopedic: Tenderness to palpation noted about the surgical site.   Radiographs: 3 views of skeletally mature adult bilateral foot: Osteotomy of the right second metatarsal noted and osteotomy of the left third metatarsal noted both in good position and alignment.  Reduction of deformity noted. Assessment:   1. Plantar flexed metatarsal bone of right foot   2. Plantar flexed metatarsal bone of left foot   3. Pes planovalgus     Plan:  Patient was evaluated and treated and all questions answered.  S/p foot surgery bilaterally - Clinically healed and officially discharged from my care if any foot and ankle issues arise in the future he  will come back and see me.  Reduction of deformity noted reduction of hyperkeratotic lesion noted.   Pes planovalgus/foot deformity -I explained to patient the etiology of pes planovalgus and relationship with heel pain/arch pain and various treatment options were discussed.  [name] patient foot structure in the setting of heel pain/arch pain I believe patient will benefit from custom-made orthotics to help control the hindfoot motion support the arch of the foot and take the stress away from arches.  Patient agrees with the plan like to proceed with orthotics -Patient was casted for  orthotics offloading of right submetatarsal 2 and left submetatarsal 3   No follow-ups on file.

## 2025-01-02 ENCOUNTER — Other Ambulatory Visit

## 2025-01-10 ENCOUNTER — Encounter: Admitting: Family Medicine
